# Patient Record
Sex: Female | Born: 1965 | Race: White | Hispanic: No | Smoking: Current every day smoker
Health system: Southern US, Community
[De-identification: ages and names within clinical notes are randomized; demographics above are authoritative.]

## PROBLEM LIST (undated history)

## (undated) DIAGNOSIS — I639 Cerebral infarction, unspecified: Secondary | ICD-10-CM

## (undated) DIAGNOSIS — Z923 Personal history of irradiation: Secondary | ICD-10-CM

## (undated) DIAGNOSIS — M1990 Unspecified osteoarthritis, unspecified site: Secondary | ICD-10-CM

## (undated) DIAGNOSIS — E669 Obesity, unspecified: Secondary | ICD-10-CM

## (undated) DIAGNOSIS — F250 Schizoaffective disorder, bipolar type: Secondary | ICD-10-CM

## (undated) DIAGNOSIS — F259 Schizoaffective disorder, unspecified: Secondary | ICD-10-CM

## (undated) DIAGNOSIS — F319 Bipolar disorder, unspecified: Secondary | ICD-10-CM

## (undated) DIAGNOSIS — Z8673 Personal history of transient ischemic attack (TIA), and cerebral infarction without residual deficits: Secondary | ICD-10-CM

## (undated) DIAGNOSIS — C50919 Malignant neoplasm of unspecified site of unspecified female breast: Secondary | ICD-10-CM

## (undated) DIAGNOSIS — F419 Anxiety disorder, unspecified: Secondary | ICD-10-CM

## (undated) DIAGNOSIS — G709 Myoneural disorder, unspecified: Secondary | ICD-10-CM

## (undated) DIAGNOSIS — F329 Major depressive disorder, single episode, unspecified: Secondary | ICD-10-CM

## (undated) DIAGNOSIS — I10 Essential (primary) hypertension: Secondary | ICD-10-CM

## (undated) DIAGNOSIS — Z720 Tobacco use: Secondary | ICD-10-CM

## (undated) DIAGNOSIS — E785 Hyperlipidemia, unspecified: Secondary | ICD-10-CM

## (undated) DIAGNOSIS — R569 Unspecified convulsions: Secondary | ICD-10-CM

## (undated) HISTORY — DX: Anxiety disorder, unspecified: F41.9

## (undated) HISTORY — DX: Obesity, unspecified: E66.9

## (undated) HISTORY — DX: Unspecified osteoarthritis, unspecified site: M19.90

## (undated) HISTORY — DX: Myoneural disorder, unspecified: G70.9

## (undated) HISTORY — DX: Malignant neoplasm of unspecified site of unspecified female breast: C50.919

## (undated) HISTORY — PX: CHOLECYSTECTOMY: SHX55

## (undated) HISTORY — DX: Unspecified convulsions: R56.9

## (undated) HISTORY — PX: ABDOMINAL HYSTERECTOMY: SHX81

## (undated) HISTORY — PX: POLYPECTOMY: SHX149

## (undated) HISTORY — DX: Personal history of transient ischemic attack (TIA), and cerebral infarction without residual deficits: Z86.73

## (undated) HISTORY — PX: COLONOSCOPY: SHX174

---

## 1998-07-03 ENCOUNTER — Encounter: Payer: Self-pay | Admitting: Emergency Medicine

## 1998-07-03 ENCOUNTER — Emergency Department (HOSPITAL_COMMUNITY): Admission: EM | Admit: 1998-07-03 | Discharge: 1998-07-03 | Payer: Self-pay | Admitting: Emergency Medicine

## 1998-07-04 ENCOUNTER — Ambulatory Visit (HOSPITAL_COMMUNITY): Admission: RE | Admit: 1998-07-04 | Discharge: 1998-07-04 | Payer: Self-pay | Admitting: Family Medicine

## 1998-07-04 ENCOUNTER — Encounter: Payer: Self-pay | Admitting: Family Medicine

## 2001-06-04 ENCOUNTER — Emergency Department (HOSPITAL_COMMUNITY): Admission: EM | Admit: 2001-06-04 | Discharge: 2001-06-04 | Payer: Self-pay | Admitting: Emergency Medicine

## 2001-06-04 ENCOUNTER — Encounter: Payer: Self-pay | Admitting: Emergency Medicine

## 2001-11-01 ENCOUNTER — Inpatient Hospital Stay (HOSPITAL_COMMUNITY): Admission: EM | Admit: 2001-11-01 | Discharge: 2001-11-03 | Payer: Self-pay

## 2001-11-04 ENCOUNTER — Other Ambulatory Visit (HOSPITAL_COMMUNITY): Admission: RE | Admit: 2001-11-04 | Discharge: 2001-11-12 | Payer: Self-pay | Admitting: *Deleted

## 2003-05-06 ENCOUNTER — Observation Stay (HOSPITAL_COMMUNITY): Admission: RE | Admit: 2003-05-06 | Discharge: 2003-05-07 | Payer: Self-pay | Admitting: Surgery

## 2003-05-06 ENCOUNTER — Encounter (INDEPENDENT_AMBULATORY_CARE_PROVIDER_SITE_OTHER): Payer: Self-pay | Admitting: Specialist

## 2003-08-30 ENCOUNTER — Emergency Department (HOSPITAL_COMMUNITY): Admission: EM | Admit: 2003-08-30 | Discharge: 2003-08-30 | Payer: Self-pay

## 2005-06-03 HISTORY — PX: BREAST BIOPSY: SHX20

## 2005-09-20 ENCOUNTER — Other Ambulatory Visit: Admission: RE | Admit: 2005-09-20 | Discharge: 2005-09-20 | Payer: Self-pay | Admitting: Family Medicine

## 2005-09-24 ENCOUNTER — Emergency Department (HOSPITAL_COMMUNITY): Admission: EM | Admit: 2005-09-24 | Discharge: 2005-09-24 | Payer: Self-pay

## 2005-09-27 ENCOUNTER — Encounter: Admission: RE | Admit: 2005-09-27 | Discharge: 2005-09-27 | Payer: Self-pay | Admitting: Family Medicine

## 2005-09-27 ENCOUNTER — Inpatient Hospital Stay (HOSPITAL_COMMUNITY): Admission: AD | Admit: 2005-09-27 | Discharge: 2005-09-27 | Payer: Self-pay | Admitting: Obstetrics

## 2005-09-30 ENCOUNTER — Encounter: Admission: RE | Admit: 2005-09-30 | Discharge: 2005-09-30 | Payer: Self-pay | Admitting: Obstetrics & Gynecology

## 2005-09-30 ENCOUNTER — Encounter (INDEPENDENT_AMBULATORY_CARE_PROVIDER_SITE_OTHER): Payer: Self-pay | Admitting: Specialist

## 2005-10-10 ENCOUNTER — Encounter (INDEPENDENT_AMBULATORY_CARE_PROVIDER_SITE_OTHER): Payer: Self-pay | Admitting: Specialist

## 2005-10-10 ENCOUNTER — Inpatient Hospital Stay (HOSPITAL_COMMUNITY): Admission: RE | Admit: 2005-10-10 | Discharge: 2005-10-12 | Payer: Self-pay | Admitting: Obstetrics & Gynecology

## 2005-10-24 ENCOUNTER — Emergency Department (HOSPITAL_COMMUNITY): Admission: EM | Admit: 2005-10-24 | Discharge: 2005-10-24 | Payer: Self-pay | Admitting: Emergency Medicine

## 2006-02-11 ENCOUNTER — Emergency Department (HOSPITAL_COMMUNITY): Admission: EM | Admit: 2006-02-11 | Discharge: 2006-02-11 | Payer: Self-pay | Admitting: Emergency Medicine

## 2006-10-24 ENCOUNTER — Emergency Department (HOSPITAL_COMMUNITY): Admission: EM | Admit: 2006-10-24 | Discharge: 2006-10-24 | Payer: Self-pay | Admitting: Emergency Medicine

## 2006-11-12 ENCOUNTER — Emergency Department (HOSPITAL_COMMUNITY): Admission: EM | Admit: 2006-11-12 | Discharge: 2006-11-12 | Payer: Self-pay | Admitting: Emergency Medicine

## 2006-12-04 ENCOUNTER — Encounter: Admission: RE | Admit: 2006-12-04 | Discharge: 2007-01-06 | Payer: Self-pay | Admitting: Family Medicine

## 2007-06-08 ENCOUNTER — Inpatient Hospital Stay (HOSPITAL_COMMUNITY): Admission: RE | Admit: 2007-06-08 | Discharge: 2007-06-16 | Payer: Self-pay | Admitting: *Deleted

## 2007-06-09 ENCOUNTER — Ambulatory Visit: Payer: Self-pay | Admitting: *Deleted

## 2007-08-09 ENCOUNTER — Emergency Department (HOSPITAL_COMMUNITY): Admission: EM | Admit: 2007-08-09 | Discharge: 2007-08-10 | Payer: Self-pay | Admitting: Emergency Medicine

## 2008-01-31 ENCOUNTER — Emergency Department (HOSPITAL_COMMUNITY): Admission: EM | Admit: 2008-01-31 | Discharge: 2008-01-31 | Payer: Self-pay | Admitting: Emergency Medicine

## 2009-03-17 ENCOUNTER — Inpatient Hospital Stay (HOSPITAL_COMMUNITY): Admission: EM | Admit: 2009-03-17 | Discharge: 2009-03-18 | Payer: Self-pay | Admitting: Emergency Medicine

## 2009-03-18 ENCOUNTER — Inpatient Hospital Stay (HOSPITAL_COMMUNITY): Admission: AD | Admit: 2009-03-18 | Discharge: 2009-03-24 | Payer: Self-pay | Admitting: Psychiatry

## 2009-03-18 ENCOUNTER — Ambulatory Visit: Payer: Self-pay | Admitting: Psychiatry

## 2009-04-03 ENCOUNTER — Observation Stay (HOSPITAL_COMMUNITY): Admission: EM | Admit: 2009-04-03 | Discharge: 2009-04-04 | Payer: Self-pay | Admitting: Emergency Medicine

## 2010-09-05 LAB — URINALYSIS, ROUTINE W REFLEX MICROSCOPIC
Bilirubin Urine: NEGATIVE
Glucose, UA: NEGATIVE mg/dL
Ketones, ur: NEGATIVE mg/dL
Nitrite: NEGATIVE
Protein, ur: NEGATIVE mg/dL
Specific Gravity, Urine: 1.005 (ref 1.005–1.030)
Urobilinogen, UA: 0.2 mg/dL (ref 0.0–1.0)

## 2010-09-05 LAB — URINE MICROSCOPIC-ADD ON

## 2010-09-05 LAB — BASIC METABOLIC PANEL
BUN: 7 mg/dL (ref 6–23)
Calcium: 9.3 mg/dL (ref 8.4–10.5)
Creatinine, Ser: 0.74 mg/dL (ref 0.4–1.2)
GFR calc non Af Amer: >60 mL/min (ref >60)
Glucose, Bld: 94 mg/dL (ref 70–99)
Potassium: 3.6 mEq/L (ref 3.5–5.1)

## 2010-09-05 LAB — CBC: MCV: 92.6 fL (ref 78.0–100.0)

## 2010-09-05 LAB — DIFFERENTIAL
Basophils Relative: 1 % (ref 0–1)
Eosinophils Absolute: 0.1 10*3/uL (ref 0.0–0.7)
Lymphocytes Relative: 39 % (ref 12–46)
Neutro Abs: 3.4 10*3/uL (ref 1.7–7.7)
Neutrophils Relative %: 52 % (ref 43–77)

## 2010-09-05 LAB — URINE CULTURE: Colony Count: NO GROWTH

## 2010-09-06 LAB — DIFFERENTIAL
Basophils Absolute: 0 10*3/uL (ref 0.0–0.1)
Basophils Relative: 1 % (ref 0–1)
Eosinophils Absolute: 0.1 10*3/uL (ref 0.0–0.7)
Monocytes Relative: 6 % (ref 3–12)
Neutro Abs: 3.3 10*3/uL (ref 1.7–7.7)
Neutrophils Relative %: 50 % (ref 43–77)

## 2010-09-06 LAB — HEPATIC FUNCTION PANEL
ALT: 20 U/L (ref 0–35)
AST: 17 U/L (ref 0–37)
Albumin: 3.3 g/dL — ABNORMAL LOW (ref 3.5–5.2)
Alkaline Phosphatase: 104 U/L (ref 39–117)
Total Protein: 6.4 g/dL (ref 6.0–8.3)

## 2010-09-06 LAB — COMPREHENSIVE METABOLIC PANEL
ALT: 25 U/L (ref 0–35)
AST: 24 U/L (ref 0–37)
Albumin: 3.8 g/dL (ref 3.5–5.2)
Alkaline Phosphatase: 118 U/L — ABNORMAL HIGH (ref 39–117)
BUN: 6 mg/dL (ref 6–23)
CO2: 27 mEq/L (ref 19–32)
Calcium: 9.3 mg/dL (ref 8.4–10.5)
Chloride: 100 mEq/L (ref 96–112)
Creatinine, Ser: 0.87 mg/dL (ref 0.4–1.2)
GFR calc Af Amer: >60 mL/min (ref >60)
GFR calc non Af Amer: >60 mL/min (ref >60)
Glucose, Bld: 112 mg/dL — ABNORMAL HIGH (ref 70–99)
Potassium: 3.3 mEq/L — ABNORMAL LOW (ref 3.5–5.1)
Sodium: 136 mEq/L (ref 135–145)
Total Bilirubin: 0.6 mg/dL (ref 0.3–1.2)
Total Protein: 6.9 g/dL (ref 6.0–8.3)

## 2010-09-06 LAB — CBC
HCT: 41.2 % (ref 36.0–46.0)
Hemoglobin: 13.7 g/dL (ref 12.0–15.0)
MCHC: 33.3 g/dL (ref 30.0–36.0)
MCV: 92.8 fL (ref 78.0–100.0)
Platelets: 248 10*3/uL (ref 150–400)
RBC: 4.44 MIL/uL (ref 3.87–5.11)
RDW: 14 % (ref 11.5–15.5)
WBC: 6.7 10*3/uL (ref 4.0–10.5)

## 2010-09-06 LAB — ETHANOL: Alcohol, Ethyl (B): <5 mg/dL (ref 0–10)

## 2010-09-06 LAB — RAPID URINE DRUG SCREEN, HOSP PERFORMED
Barbiturates: NOT DETECTED
Opiates: NOT DETECTED

## 2010-09-06 LAB — ACETAMINOPHEN LEVEL: Acetaminophen (Tylenol), Serum: <10 ug/mL — ABNORMAL LOW (ref 10–30)

## 2010-10-16 NOTE — H&P (Signed)
April [name], [name]                ACCOUNT NO.:  [id number]   MEDICAL RECORD NO.:  [id number]          PATIENT TYPE:  IPS   LOCATION:  0304                          FACILITY:  [facility]   PHYSICIAN:  Anselm Jungling, MD  DATE OF BIRTH:  [birth year]   DATE OF ADMISSION:  06/08/2007  DATE OF DISCHARGE:                       PSYCHIATRIC ADMISSION ASSESSMENT   HISTORY:  This is a 45 year old female voluntarily admitted on June 08, 2007.  The patient presents with a history of depression since August  she states since her husband had left her.  She states she has been  trying to hide her depression and thinking that, if she could just kill  herself, things would be better.  The patient had thoughts to shoot  herself.  She does have access to guns.  Stresses are that her husband  left her after 20 years for another woman.  She is currently evicted  from her home.  The patient is feeling very overwhelmed.  She is  currently living with her sister.  The patient feels she is a burden to  her sister.   PAST PSYCHIATRIC HISTORY:  First admission to [facility].  She has been on Prozac in the past.  She is taking various doses at  various times since August.  She reports a history of cutting.   SOCIAL HISTORY:  This is a 45 year old married female, married for 20  years.  This is her second marriage.  She has 3 children, 2 are grown  and 52 is a 86 year old.  The 45 year old lives with the husband's  parents.  The patient has been unemployed for 10 years.  She denies any  legal problems.   FAMILY HISTORY:  None.   SOCIAL HISTORY:  The patient smokes.  She denies any alcohol use or  smoking marijuana.   PRIMARY CARE PHYSICIAN:  Dr. Elias Else, phone number [phone].   PAST MEDICAL HISTORY:  No acute or chronic health issues.   PAST SURGICAL HISTORY:  Significant for:  Hysterectomy and gallbladder  removal in 2004.   MEDICATIONS:  She has been taking Prozac anywhere from 40  to 80 mg  prescribed by Dr. Elias Else.   ALLERGIES:  MORPHINE.  She reports a rash.   CHIEF COMPLAINT:  Suicidal thoughts.  The patient is presenting tearful.   REVIEW OF SYSTEMS:  She denies any fever, chills.  No chest pain or  shortness of breath.  The patient smokes.  No nausea, vomiting or  diarrhea.  No blurred vision.  No headache.  No dizziness.  No falls.  Positive for depression.  Positive for anxiety.  Positive for suicidal  thoughts.   PHYSICAL EXAMINATION:  VITAL SIGNS:  Temperature is 98.1, 77 heart rate,  14 respirations, blood pressure is 138/92, 159 pounds and she is  approximately 5 feet 6 inches tall.  GENERAL:  This is a middle-aged female in no acute physical distress.  HEENT:  Her head is atraumatic.  TM s with no redness, no injection.  Her dental hygiene is adequate.  NECK:  Negative lymphadenopathy.  CHEST:  Clear, no wheezing.  BREASTS:  Exam was deferred.  HEART:  Regular rate and rhythm.  ABDOMEN:  Soft, nontender.  EXTREMITIES:  Moves all extremities, 5+ against resistance.  No edema.  No clubbing.  SKIN:  The patient has a vesicular rash beneath her xyphoid process that  healed.  It does have some scaly appearance.  She also has some  scattered also scaly lesions on her arms and shoulders and has healed  scars to her forearms.  NEUROLOGIC:  Her neurological findings are intact and nonfocal.  Cranial  nerves II through XII are intact.  MENTAL STATUS EXAM:  This is a middle-aged female.  She is casually  dressed, good eye contact.  Her speech is clear, normal pace and tone.  The patient's mood is depressed.  Her affect is tearful and somewhat  traumatic.  Thought process are coherent.  There is no evidence of any  psychosis.  Her answers are coherent and goal directed.  Cognitive  function intact.  Her memory is good.  Judgment and insight is fair.   LABORATORY:  Glucose of 104.  CBC is within normal limits.  Urine drug  screen is not  available.   DIAGNOSES:  AXIS I:  Major depressive disorder, recurrent, severe.  AXIS II:  Deferred.  AXIS III:  No acute or chronic health issues.  AXIS IV:  Problems with occupation, housing, economic issues and other  psychosocial problems.  AXIS V:  Current is 35.   PLAN:  Contract for safety.  We will stabilize.  We will continue with  the Prozac at 40 mg.  We will add Risperdal for anxiety, ruminating and  mood stabilization.  Risk and benefits of the medication were discussed.  The patient is agreeable to beginning medications.  We will also add  p.r.n. Risperdal throughout the day.  The patient to increase coping  skills.  We will contact her primary care Jini Horiuchi in regard to her rash  for recommendations.  We will have a family session with her support  group. Medication compliance to be reinforced.  Case manager is to  assess her follow up.   ESTIMATED LENGTH OF STAY:  Four to 6 days.      Landry Corporal, N.P.      Anselm Jungling, MD  Electronically Signed    JO/MEDQ  D:  06/11/2007  T:  06/12/2007  Job:  [id number]   cc:   Anselm Jungling, MD   Jasmine Pang, M.D.

## 2010-10-16 NOTE — Consult Note (Signed)
NAME:  April [name], April [name] NO.:  [id number]   MEDICAL RECORD NO.:  [id number]          PATIENT TYPE:  EMS   LOCATION:  MAJO                         FACILITY:  [facility]   PHYSICIAN:  Bernette Redbird, M.D.   DATE OF BIRTH:  19-Dec-1965   DATE OF CONSULTATION:  11/12/2006  DATE OF DISCHARGE:                                 CONSULTATION   Dr. Lorre Nick of the ER staff asked [place] to see this 45 year old  Caucasian female because of rectal bleeding.   April [name] has a history of rectal/pelvic floor pain and recurrent rectal  bleeding, for which she underwent colonoscopic evaluation by Dr. Randa Evens  approximately five days ago, at which time she was known to have  internal hemorrhoids but no polyps.  Random mucosal biopsies were  obtained to look for evidence of microscopic colitis, the results of  which are not available to me at this time.   With that background, the patient has noticed some ongoing rectal  bleeding which has been a recurrent intermittent problem for her, as  well as the above-mentioned rectal pain.  She has been asking for pain  medicine in the ER although 2 mg of Dilaudid did not really help her.   Evaluation by the ER physician showed that the patient was minimally  tachycardiac with heart rate around 100, but without orthostatic changes  in pulse or blood pressure and her hemoglobin came back normal at 13.2  with a normal MCV and normal platelet count.  In addition, chemistry  panel was normal with particular reference to her BUN level.   Based on all of this, she was thought to be probably clinically stable  but it was requested that we see her to clear her for discharge from the  emergency room, in view of her mild tachycardia.   PHYSICAL EXAMINATION:  Supine blood pressure 104/68 with pulse of 75.  Standing blood pressure 131/84, pulse of 84.  She is anicteric and without pallor.  She is somewhat withdrawn and  tearful.  She is, however, in no acute  distress.  She asked several  times if we were going to give her pain medications.  CHEST:  The chest has a few soft expiratory wheezes consistent with her  smoking history.  HEART:  The heart is normal with a regular rhythm and rate,  approximately 87 beats per minute.  ABDOMEN:  The abdomen has normal bowel sounds and no organomegaly,  guarding, mass, or tenderness whatsoever.  Perianal exam shows a little bit of dried blood on the perianal skin, no  prolapsed hemorrhoids, no obvious fissures, fistulae or abscess.  No  skin tags.  Sphincter tone is normal to perhaps slightly increased.  The  rectal ampulla is normal but there is some soft brown stool present  which is admixed with a small amount of blood.   Rigid sigmoidoscopy:  This was performed to about 10 cm or 12 cm and  showed brown stool at the apex of the rectum with a small amount of  strands of blood in the more distal rectum.  No fresh blood, large  clots, or liquid blood were seen, nor was any active bleeding evident at  the time of this exam.  The rectal mucosa looked normal and no polyps or  masses were seen.  Careful pull out through the anal canal showed what  were actually rather mild to moderate internal hemorrhoids, nothing  particularly succulent or enlarged, nothing actively bleeding.  I did  not see an anal fissure.   IMPRESSION:  I think the overall picture is very compatible with a  hemorrhoidal bleed.  It does not appear that the blood is coming from  above based on the brown stool in the proximal rectum, the stable vital  signs, the normal hemoglobin level, the normal BUN, and the negative  recent colonoscopy.  The reason the hemorrhoids do not currently look  more pronounced is probably that they have decompressed due to the  current bleeding.   PLAN:  1. The patient was offered a medication such as NuLev for her pelvic      floor pain but she indicates that belladonna/phenobarbital has not      helped  and she does not really seem particularly interested in      trying that medication.  Note that sitz baths have also not been      helpful.  She might benefit from pelvic floor EMGs and biofeedback      relaxation training.  2. Regarding bleeding which is the main thing that prompted her to      come to the emergency room, I offered her reassurance and explained      that she can expect to see such bleeding for several days at a time      with each bowel movement, as is often the case for hemorrhoidal      bleeding.  I explained that there was not evidence of any      substantial hemorrhage from above and that her white count was      normal as were her vital signs.  To address this problem, I have      called Dr. Randa Evens' medical assistant who will contact [facility] to have an appointment made for this      patient in the near future and the patient knows to contact us if      she has not heard from their office in the next two days.           ______________________________  Bernette Redbird, M.D.     RB/MEDQ  D:  11/12/2006  T:  11/13/2006  Job:  [id number]   cc:   April [name]., M.D.  [facility]

## 2010-10-16 NOTE — Discharge Summary (Signed)
[name], [name]                ACCOUNT NO.:  [id number]   MEDICAL RECORD NO.:  [id number]          PATIENT TYPE:  IPS   LOCATION:  0304                          [facility]ACILITY[facility]  [facility]   PHYSICIAN:  Anselm Jungling, MD  DATE OF BIRTH:  [birth year]   DATE OF ADMISSION:  06/08/2007  DATE OF DISCHARGE:  06/16/2007                               DISCHARGE SUMMARY   IDENTIFYING DATA/REASON FOR ADMISSION:  This is an inpatient psychiatric  admission for April [name], a 46 year old female who presented with a history  of depression of 4 months duration, stemming from the time that her  husband had left her.  She had been having increasing thoughts of  suicide.  She thought of shooting herself, and she did have access to  guns.  She had also recently been evicted from her home and was feeling  generally overwhelmed.  She has been living with her sister.  This was  her first [facility] admission.  Please refer to the admission note for further  details pertaining to the symptoms, circumstances and history that led  to her hospitalization.  She was given initial Axis I diagnosis of major  depressive disorder, recurrent, severe.   MEDICAL AND LABORATORY:  The patient was medically and physically  assessed by the psychiatric nurse practitioner.  She was in good health  without active or chronic medical problems.  Her primary care physician  is Dr. Nicholos Johns.  There were no significant medical issues.  She was  referred back to Dr. Nicholos Johns for followup appointment pertaining to a  longstanding rash that she had had.  The nurse practitioner discussed  the rash on the phone with Dr. Nicholos Johns, and he stated that he wanted to  evaluate it in his office after discharge.   HOSPITAL COURSE:  The patient was admitted to the adult inpatient  psychiatric service.  She presented as a well-nourished, well-developed  adult female who was quite tired-appearing and very depressed.  There  were no signs or symptoms of psychosis or thought  disorder.  She denied  any acute suicidal ideation and verbalized a strong desire for help.   She had reported a good response to Prozac in the past and was restarted  on Prozac at 20 mg daily, which was later increased to 40 mg daily.  Trazodone was used at bedtime.   The patient described a good deal of anxiety symptoms accompanied by  agitation and difficulty sleeping.  Because of this, she was started on  trials of low-dose Risperdal at bedtime.  This was well tolerated, and  Risperdal was increased in an incremental fashion up to an ultimate  level of 3 mg q.h.s., with which she was getting very good results.   On the ninth hospital day, the patient appeared appropriate for  discharge.  She had been absent suicidal ideation for several days.  She  appeared to have benefited as much as possible from the program and was  tolerating her medication.  She agreed to the following aftercare plan.   AFTERCARE:  The patient was to follow up with Dr. Lolly Mustache  in our  outpatient clinic in [place] on June 26, 2007.  She was also to  follow up with Dr. Nicholos Johns on January 12, at 12:00 noon for general  medical followup.   DISCHARGE MEDICATIONS:  Prozac 40 mg daily, Risperdal 3 mg q.h.s.,  trazodone 100 mg q.h.s.   DISCHARGE DIAGNOSES:  AXIS I:  Major depressive disorder, recurrent,  without psychotic features, rule out bipolar II, depressed.  AXIS II:  Deferred.  AXIS III:  Rash of unknown etiology.  AXIS IV:  Stressors severe.  AXIS V:  GAF on discharge 60.      Anselm Jungling, MD  Electronically Signed     SPB/MEDQ  D:  06/19/2007  T:  06/19/2007  Job:  [id number]

## 2010-10-19 NOTE — Op Note (Signed)
April [name], April [name]                          ACCOUNT NO.:  [id number]   MEDICAL RECORD NO.:  [id number]                   PATIENT TYPE:  AMB   LOCATION:  DAY                                  FACILITY:  [facility]   PHYSICIAN:  Currie Paris, M.D.           DATE OF BIRTH:  05/01/1966   DATE OF PROCEDURE:  05/06/2003  DATE OF DISCHARGE:                                 OPERATIVE REPORT   CCS#:  [id number]   PREOPERATIVE DIAGNOSIS:  Chronic cholecystitis with biliary dyskinesia.   POSTOPERATIVE DIAGNOSIS:  Chronic cholecystitis with biliary dyskinesia.   OPERATION:  Laparoscopic cholecystectomy with operative cholangiogram.   SURGEON:  Currie Paris, M.D.   ASSISTANT:  Sheppard Plumber. Earlene Plater, M.D.   ANESTHESIA:  General endotracheal.   HISTORY:  This patient is a 45 year old whose had some history of elevation  of liver functions and biliary type symptoms and her workup showed no stones  but marked abnormal emptying fraction.   DESCRIPTION OF PROCEDURE:  The patient was seen in the holding area and had  no further questions. She was taken to the operating room and after  satisfactory general endotracheal anesthesia had been obtained, the abdomen  was prepped and draped. The 0.25% plain Marcaine was used for each incision.  The umbilical incision was made first, the fascia opened and the peritoneal  cavity entered under direct vision. A pursestring was placed, Hasson  introduced, and the abdomen insufflated to 15. A camera was introduced and  the liver appeared to be grossly normal. There were a few adhesions over the  gallbladder. A 10/11 trocar was placed in the epigastrium and two 5 mm  laterally and the patient placed in reverse Trendelenburg. The gallbladder  was retracted over the liver and the peritoneum over the cystic duct opened  and a nice window made behind the cystic duct and the triangle of Calot. I  opened the peritoneum on both sides of the gallbladder. The cystic  artery  was somewhat adherent to the posterior aspect of the cystic duct, was  dissected off and a clip was placed on the artery and one on the cystic duct  at its junction with the gallbladder.   The cystic duct was opened and a Cook catheter introduced percutaneously and  placed in the cystic duct and held with the clip. Operative cholangiography  showed a normal cystic duct, normal common duct, good filling in the  duodenum and filling of the hepatic radicles with no evidence of stones or  other problems.   The cystic duct catheter was removed and three clips placed in the stay side  of the cystic duct and it was divided. Additional clips were placed on the  cystic duct and it was divided leaving two clips on the stay side. The  gallbladder was then removed from below to above with coagulation current of  the current. Just prior to disconnecting and just after  disconnecting, we  irrigated and made sure the bed was completely dry. The gallbladder was  brought out the umbilical port and the abdomen reinsufflated. A final  irrigation check for hemostasis was made and again everything appeared dry.  The lateral ports were removed and there was no bleeding. The umbilical port  was closed with the  pursestring. The abdomen was deflated through the epigastric port. The skin  was closed with 4-0 Monocryl subcuticular and Dermabond.   The patient tolerated the procedure well. There were no operative  complications and all counts were correct.                                               Currie Paris, M.D.    CJS/MEDQ  D:  05/06/2003  T:  05/06/2003  Job:  [id number]   cc:   Griffith Citron, M.D.  [facility] [place]  [place] [address]  Fax: [phone]   [facility] [address]

## 2010-10-19 NOTE — H&P (Signed)
NAMELYNDSI, ALTIC                ACCOUNT NO.:  [id number]   MEDICAL RECORD NO.:  [id number]          PATIENT TYPE:  INP   LOCATION:  NA                            FACILITY:  [facility]   PHYSICIAN:  Roseanna Rainbow, M.D.DATE OF BIRTH:  March 20, 1966   DATE OF ADMISSION:  DATE OF DISCHARGE:                                HISTORY & PHYSICAL   CHIEF COMPLAINT:  The patient is a 45 year old Caucasian female with a newly  diagnosed pelvic mass who presents for total abdominal hysterectomy,  possible bilateral salpingo-oophorectomy.   HISTORY OF PRESENT ILLNESS:  The patient had presented to the [facility]  Emergency Room with abdominal pain, nausea, vomiting, and diarrhea several  weeks prior to presentation.  Workup at that point was remarkable for  possible right-sided ovarian cyst.  Further workup included subsequent MRI  of the pelvis that demonstrated a 9 cm soft tissue mass in the posterior cul-  de-sac that was contiguous with the left ovary and the uterus.  There was  also an incidental small fibroid seen in the uterus.  The CA-125 on April 25  was 20.7.   ALLERGIES:  MORPHINE.   MEDICATIONS:  Prozac, Naprosyn.   PAST MEDICAL HISTORY:  Depression.   PAST SURGICAL HISTORY:  1.  Cholecystectomy.  2.  Bilateral tubal ligation.   SOCIAL HISTORY:  Married, lives with her spouse.  She currently smokes,  drinks a minimal amount of alcohol.  Admits to using marijuana.   FAMILY HISTORY:  COPD, MS.   PAST OB-GYN HISTORY:  1.  Recent mammogram normal.  2.  History of cervical dysplasia and cryocautery of the cervix.  3.  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   REVIEW OF SYSTEMS:  GI: See above.  GU: She has a history of dysmenorrhea  and heavy menses.   PHYSICAL EXAMINATION:  VITAL SIGNS: Stable, afebrile.  GENERAL:  Well-developed, well-nourished, minimal distress.  ABDOMEN: Soft, nontender, without masses.  Bowel  sounds active.  LUNGS: Clear to auscultation bilaterally.  HEART: Regular rate and rhythm.  PELVIC:  Normal EG/BUS.  On speculum exam, the vagina is clean.  On bimanual  exam, the uterus is anteverted, normal size, nontender.  Adnexa: No masses  or local guarding. On rectovaginal exam, there is a posterior cul-de-sac  mass.   ASSESSMENT:  Pelvic mass.  Differential diagnoses: Pedunculated myoma,  benign left adnexal tumor.   PLAN:  The planned procedure is exploratory laparotomy, total abdominal  hysterectomy, possible bilateral salpingo-oophorectomy.  The risks,  benefits, and alternative forms of management were reviewed with the  patient, and informed consent has been obtained.      Roseanna Rainbow, M.D.  Electronically Signed     LAJ/MEDQ  D:  10/09/2005  T:  10/09/2005  Job:  [id number]

## 2010-10-19 NOTE — Discharge Summary (Signed)
April [name], April [name]                ACCOUNT NO.:  [id number]   MEDICAL RECORD NO.:  [id number]          PATIENT TYPE:  INP   LOCATION:  9315                          [facility]ACILITY:  [facility]   PHYSICIAN:  Roseanna Rainbow, M.D.DATE OF BIRTH:  1965/12/14   DATE OF ADMISSION:  10/10/2005  DATE OF DISCHARGE:  10/12/2005                                 DISCHARGE SUMMARY   CHIEF COMPLAINT:  The patient is a 45 year old Caucasian female with a newly  diagnosed pelvic mass who presents for a total abdominal hysterectomy,  possible bilateral salpingo-oophorectomy.  Please see the dictated History  and Physical for further details.   HOSPITAL COURSE:  The patient was admitted and underwent a total abdominal  hysterectomy and bilateral salpingo-oophorectomy.  Please see the dictated  operative summary for further details.   Her postoperative course was uneventful.  On postoperative day #1, her  hemoglobin was 11.3.  She was discharged to home on postoperative day #2,  tolerating a regular diet.   DISCHARGE DIAGNOSES:  1.  Uterine fibroids.  2.  Adenomyosis.  3.  Ovarian fibrosarcoma.   PROCEDURES:  Total abdominal hysterectomy and bilateral salpingo-  oophorectomy.   CONDITION:  Good.   DIET:  Regular.   ACTIVITY:  Progressive activity, pelvic rest.   MEDICATIONS:  1.  Resume home medications.  2.  OxyContin 20 mg 1 tablet p.o. b.i.d.  3.  Dilaudid 4 mg tablets 1 tablet p.o. 4 times a day as needed.  4.  Ibuprofen 600 mg 1 tablet p.o. 4 times a day as needed.  5.  Climara patch, apply weekly.  6.  Over-the-counter stool softener b.i.d.   DISPOSITION:  The patient was to follow up in the office in 2 weeks.     Roseanna Rainbow, M.D.  Electronically Signed    LAJ/MEDQ  D:  11/09/2005  T:  11/10/2005  Job:  [id number]
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
## 2010-10-19 NOTE — Op Note (Signed)
NAMEEARLENE, April [name] NO.:  [id number]   MEDICAL RECORD NO.:  [id number]          PATIENT TYPE:  INP   LOCATION:  9315                          FACILITY:  [facility]   PHYSICIAN:  Roseanna Rainbow, M.D.DATE OF BIRTH:  1965-08-23   DATE OF PROCEDURE:  10/10/2005  DATE OF DISCHARGE:                                 OPERATIVE REPORT   PREOPERATIVE DIAGNOSIS:  Pelvic mass.   POSTOPERATIVE DIAGNOSIS:  Left-sided ovarian fibro-thecoma, small fibroid  uterus   PROCEDURE:  Total abdominal hysterectomy with bilateral salpingo-  oophorectomy.   SURGEONS:  1.  Dr. Tamela Oddi  2.  Dr. Clearance Coots   ANESTHESIA:  General endotracheal.   COMPLICATIONS:  None.   IV FLUIDS AND URINE OUTPUT:  As per anesthesiology.   ESTIMATED BLOOD LOSS:  350 mL.   PROCEDURE:  The risks, benefits, indications, and alternatives of the  procedure were reviewed with the patient, and informed consent had been  obtained.  She was taken to the operating room with an IV running.  The  patient was placed in the dorsal supine position, given general anesthesia,  and prepped and draped in the usual sterile fashion.  A Pfannenstiel skin  incision was then made approximately 2 cm above the symphysis pubis and  extended to the fascia.  The fascia was then incised bilaterally with curved  Mayo scissors.  The muscles of the anterior abdominal wall were separated in  the midline.  The parietal peritoneum was tented up and entered bluntly.  The peritoneal incision was extended.  The pelvis was examined, and the left  ovary was enlarged approximately 8 cm in diameter with a firm, solid mass.  There were some excrescences noted.  An O'Connor-O'Sullivan retractor was  placed into the incision.  The bowel was packed away with moistened  laparotomy sponges.  At this point, there were filmy adhesions involving the  sigmoid colon to the infundibulopelvic ligament on the left.  These were  lysed with the Bovie.   The utero-ovarian ligament was skeletonized, doubly  clamped with parametrial clamps, and transected.  Both free ligatures and  suture ligatures were placed.  The utero-ovarian ligament and fallopian tube  were then clamped in the cornua.  And the left adnexa was excised.  This was  sent for frozen section.  Please note that washings were sent prior to this.  Two long Kelly clamps were then placed on the cornu and used for retraction.  The round ligament on both sides were divided with Bovie.  The anterior leaf  of the broad ligament was incised along the bladder reflection to the  midline from both sides.  The bladder was dissected off the lower uterine  segment and cervix.  The infundibulopelvic ligament on the right side was  doubly clamped, transected, and both free ligature and suture ligature was  placed.  Hemostasis was visualized.  The uterine arteries were skeletonized  bilaterally, clamped with parametrial clamps, transected, and suture-ligated  with 0 Vicryl.  Again, hemostasis was assured.  The uterosacral ligaments  were clamped on both sides, transected, and suture ligated in a similar  fashion.  The cervix was amputated with scissors.  The vaginal cuff angles  were suture ligated with sutures of 0 Vicryl.  The remainder of the vaginal  cuff was closed with a series of interrupted sutures of 0 Vicryl.  Hemostasis was assured.  The pelvis was copiously irrigated with warm normal  saline.  All laparotomy sponges and instruments were removed from the  abdomen.  The parietal peritoneum was closed with a running 2-0 Monocryl.  The fascia was closed with a running 0 Vicryl.  The skin was closed with  staples.  Sponge, lap, needle, and instrument counts were correct x2.  The  patient was taken to the PACU awake and in stable condition.      Roseanna Rainbow, M.D.  Electronically Signed     LAJ/MEDQ  D:  10/10/2005  T:  10/11/2005  Job:  [id number]

## 2011-02-21 LAB — CBC
HCT: 39.3
Hemoglobin: 13.6
MCV: 89.6
WBC: 8.5

## 2011-02-21 LAB — COMPREHENSIVE METABOLIC PANEL
Alkaline Phosphatase: 96
BUN: 10
CO2: 28
Chloride: 102
GFR calc non Af Amer: 58 — ABNORMAL LOW
Glucose, Bld: 104 — ABNORMAL HIGH
Potassium: 3.6
Total Bilirubin: 0.5

## 2011-02-21 LAB — URINALYSIS, ROUTINE W REFLEX MICROSCOPIC
Glucose, UA: NEGATIVE
Hgb urine dipstick: NEGATIVE
Ketones, ur: NEGATIVE
pH: 5.5

## 2011-02-21 LAB — DRUGS OF ABUSE SCREEN W/O ALC, ROUTINE URINE
Amphetamine Screen, Ur: NEGATIVE
Barbiturate Quant, Ur: NEGATIVE
Creatinine,U: 125.9
Phencyclidine (PCP): NEGATIVE

## 2011-02-21 LAB — BENZODIAZEPINE, QUANTITATIVE, URINE: Alprazolam (GC/LC/MS), ur confirm: NEGATIVE

## 2011-02-21 LAB — THC (MARIJUANA), URINE, CONFIRMATION: Marijuana, Ur-Confirmation: 46 ng/mL

## 2011-02-25 LAB — COMPREHENSIVE METABOLIC PANEL
AST: 21
CO2: 27
Chloride: 101
Creatinine, Ser: 0.82
GFR calc Af Amer: >60
GFR calc non Af Amer: >60
Total Bilirubin: 0.6

## 2011-02-25 LAB — POCT CARDIAC MARKERS
Operator id: 3206
Troponin i, poc: <0.05
Troponin i, poc: <0.05

## 2011-02-25 LAB — DIFFERENTIAL
Basophils Absolute: 0.1
Eosinophils Absolute: 0.1
Eosinophils Relative: 2
Lymphocytes Relative: 41

## 2011-02-25 LAB — CBC
HCT: 38.4
Hemoglobin: 13.4
MCV: 89.7
RBC: 4.28
WBC: 8

## 2011-02-25 LAB — PREGNANCY, URINE: Preg Test, Ur: NEGATIVE

## 2011-02-25 LAB — RAPID URINE DRUG SCREEN, HOSP PERFORMED
Opiates: POSITIVE — AB
Tetrahydrocannabinol: POSITIVE — AB

## 2011-03-21 LAB — COMPREHENSIVE METABOLIC PANEL
AST: 20
Alkaline Phosphatase: 115
BUN: 11
CO2: 24
Chloride: 105
Creatinine, Ser: 0.65
GFR calc non Af Amer: >60
Total Bilirubin: 0.4

## 2011-03-21 LAB — DIFFERENTIAL
Eosinophils Relative: 2
Lymphocytes Relative: 41
Lymphs Abs: 4 — ABNORMAL HIGH
Monocytes Absolute: 0.7

## 2011-03-21 LAB — TYPE AND SCREEN
ABO/RH(D): O NEG
Antibody Screen: NEGATIVE

## 2011-03-21 LAB — CBC
HCT: 38.8
Hemoglobin: 13.2
RBC: 4.27
WBC: 9.8

## 2011-03-21 LAB — PROTIME-INR: INR: 0.9

## 2011-03-21 LAB — APTT: aPTT: 29

## 2011-03-21 LAB — ABO/RH: ABO/RH(D): O NEG

## 2012-01-11 ENCOUNTER — Encounter (HOSPITAL_COMMUNITY): Payer: Self-pay | Admitting: Emergency Medicine

## 2012-01-11 ENCOUNTER — Emergency Department (HOSPITAL_COMMUNITY)
Admission: EM | Admit: 2012-01-11 | Discharge: 2012-01-11 | Disposition: A | Discharge status: Home / Self Care | Payer: Medicaid Other | Attending: Emergency Medicine | Admitting: Emergency Medicine

## 2012-01-11 DIAGNOSIS — F17200 Nicotine dependence, unspecified, uncomplicated: Secondary | ICD-10-CM | POA: Insufficient documentation

## 2012-01-11 DIAGNOSIS — I10 Essential (primary) hypertension: Secondary | ICD-10-CM | POA: Insufficient documentation

## 2012-01-11 DIAGNOSIS — F259 Schizoaffective disorder, unspecified: Secondary | ICD-10-CM | POA: Insufficient documentation

## 2012-01-11 DIAGNOSIS — R55 Syncope and collapse: Secondary | ICD-10-CM | POA: Insufficient documentation

## 2012-01-11 DIAGNOSIS — F319 Bipolar disorder, unspecified: Secondary | ICD-10-CM | POA: Insufficient documentation

## 2012-01-11 HISTORY — DX: Bipolar disorder, unspecified: F31.9

## 2012-01-11 HISTORY — DX: Essential (primary) hypertension: I10

## 2012-01-11 HISTORY — DX: Major depressive disorder, single episode, unspecified: F32.9

## 2012-01-11 HISTORY — DX: Schizoaffective disorder, unspecified: F25.9

## 2012-01-11 HISTORY — DX: Schizoaffective disorder, bipolar type: F25.0

## 2012-01-11 LAB — COMPREHENSIVE METABOLIC PANEL
AST: 22 U/L (ref 0–37)
Albumin: 3.8 g/dL (ref 3.5–5.2)
Chloride: 99 mEq/L (ref 96–112)
Creatinine, Ser: 0.94 mg/dL (ref 0.50–1.10)
Potassium: 3.9 mEq/L (ref 3.5–5.1)
Total Bilirubin: 0.3 mg/dL (ref 0.3–1.2)

## 2012-01-11 LAB — CBC WITH DIFFERENTIAL/PLATELET
Basophils Absolute: 0.1 10*3/uL (ref 0.0–0.1)
Basophils Relative: 1 % (ref 0–1)
MCHC: 33.4 g/dL (ref 30.0–36.0)
Monocytes Absolute: 0.6 10*3/uL (ref 0.1–1.0)
Neutro Abs: 8.2 10*3/uL — ABNORMAL HIGH (ref 1.7–7.7)
Neutrophils Relative %: 68 % (ref 43–77)
RDW: 13.5 % (ref 11.5–15.5)

## 2012-01-11 NOTE — ED Notes (Signed)
Pt st's she had a passing out spell. St's her friends told her she had a seizure.  Pt not incontinent no trauma to tongue. Pt alert and oriented x's 3, skin warm and dry, color appropriate.  Neuro exam neg.

## 2012-01-11 NOTE — ED Notes (Signed)
Pt had vomiting episode, trembling, near syncope burning in back of neck. EMS said 90's systolic initially.  States that she has hx of this. Been worked up for it; possible TIA dx? Pt has taken night sedative meds.

## 2012-01-11 NOTE — ED Provider Notes (Signed)
History     CSN: [id number]  Arrival date & time 01/11/12  0132   First MD Initiated Contact with Patient 01/11/12 [phone]      Chief Complaint  Patient presents with  . Near Syncope    (Consider location/radiation/quality/duration/timing/severity/associated sxs/prior treatment) HPI Comments: Patient presents with questionable seizure versus syncope episode. Patient has episodes of "spells" that she has been recently seen by a neurologist at [facility]. Patient's mother witnessed the episode tonight. She states that the patient became minimally responsive and slumped back in a chair. She had a mild shaking of her right arm. She vomited once. Mother states that it looked like she was having a seizure. Patient's was confused afterwards. She did not bite her tongue and was not incontinent -- however she has in the past. Patient brought paperwork stating that her neurologist wanted to plan an MRI and EEG. Possible etiologies include anxiety, seizure, pseudoseizure. Patient is on several different psychiatric medications and she states that she has been taking these as prescribed. She otherwise denies fever, blurry vision, weakness in her extremities, trouble walking or talking. She has some paresthesias in the back of her neck. Her episodes occur 1-2 times a month. Last episode was 2 weeks ago. Onset acute. Course resolved. Nothing makes her symptoms better or worse.   The history is provided by the patient and a parent.    Past Medical History  Diagnosis Date  . Hypertension   . Bipolar 1 disorder   . Schizo affective schizophrenia   . Depression, major     Past Surgical History  Procedure Date  . Cholecystectomy   . Abdominal hysterectomy     No family history on file.  History  Substance Use Topics  . Smoking status: Current Everyday Smoker  . Smokeless tobacco: Not on file  . Alcohol Use: Yes     ocassional    OB History    Grav Para Term Preterm Abortions TAB SAB Ect Mult  Living                  Review of Systems  Constitutional: Negative for fever and activity change.  HENT: Negative for sore throat and rhinorrhea.   Eyes: Negative for redness.  Respiratory: Negative for cough.   Cardiovascular: Negative for chest pain.  Gastrointestinal: Negative for nausea, vomiting, abdominal pain and diarrhea.       No incontinence  Genitourinary: Negative for dysuria and enuresis.  Musculoskeletal: Negative for myalgias.  Skin: Negative for rash.  Neurological: Positive for tremors (during episode), seizures (seizure-like activity) and numbness (paresthesias). Negative for dizziness, speech difficulty, weakness, light-headedness and headaches.  Psychiatric/Behavioral: Positive for confusion.    Allergies  Morphine and related  Home Medications   Current Outpatient Rx  Name Route Sig Dispense Refill  . CITALOPRAM HYDROBROMIDE 20 MG PO TABS Oral Take 30 mg by mouth daily.    Marland Kitchen CLONAZEPAM 1 MG PO TABS Oral Take 1.5 mg by mouth daily. Scheduled dose    . CLONIDINE HCL 0.1 MG PO TABS Oral Take 0.1 mg by mouth 2 (two) times daily.    Marland Kitchen HYDROXYZINE HCL 50 MG PO TABS Oral Take 50 mg by mouth 3 (three) times daily. Scheduled doses    . LAMOTRIGINE 200 MG PO TABS Oral Take 200 mg by mouth 2 (two) times daily.    Marland Kitchen LITHIUM CARBONATE 300 MG PO CAPS Oral Take 300 mg by mouth 2 (two) times daily with a meal.    .  OLANZAPINE 15 MG PO TABS Oral Take 15 mg by mouth at bedtime.    . THIOTHIXENE 2 MG PO CAPS Oral Take 2 mg by mouth 2 (two) times daily.      BP 134/81  Pulse 85  Temp 98.1 F (36.7 C) (Oral)  Resp 20  SpO2 98%  Physical Exam  Nursing note and vitals reviewed. Constitutional: She is oriented to person, place, and time. She appears well-developed and well-nourished.  HENT:  Head: Normocephalic and atraumatic.  Right Ear: Hearing, tympanic membrane and ear canal normal.  Left Ear: Hearing, tympanic membrane and ear canal normal.  Mouth/Throat: Uvula  is midline and oropharynx is clear and moist. Mucous membranes are not dry.  Eyes: Conjunctivae are normal. Pupils are equal, round, and reactive to light. Right eye exhibits no discharge. Left eye exhibits no discharge.  Neck: Normal range of motion. Neck supple.  Cardiovascular: Normal rate, regular rhythm and normal heart sounds.   No murmur heard. Pulmonary/Chest: Effort normal and breath sounds normal. No respiratory distress. She has no wheezes. She has no rales.  Abdominal: Soft. There is no tenderness.  Musculoskeletal: She exhibits no edema and no tenderness.  Neurological: She is alert and oriented to person, place, and time. She has normal strength. No cranial nerve deficit or sensory deficit. She displays a negative Romberg sign. She displays no seizure activity. Coordination and gait normal. GCS eye subscore is 4. GCS verbal subscore is 5. GCS motor subscore is 6.  Reflex Scores:      Patellar reflexes are 2+ on the right side and 2+ on the left side. Skin: Skin is warm and dry.  Psychiatric: She has a normal mood and affect.    ED Course  Procedures (including critical care time)  Labs Reviewed  CBC WITH DIFFERENTIAL - Abnormal; Notable for the following:    WBC 12.2 (*)     Neutro Abs 8.2 (*)     All other components within normal limits  COMPREHENSIVE METABOLIC PANEL - Abnormal; Notable for the following:    Glucose, Bld 108 (*)     Alkaline Phosphatase 142 (*)     GFR calc non Af Amer 72 (*)     GFR calc Af Amer 83 (*)     All other components within normal limits  LITHIUM LEVEL - Abnormal; Notable for the following:    Lithium Lvl <0.25 (*)     All other components within normal limits  LAB REPORT - SCANNED   No results found.   1. Syncope     2:50 AM Patient seen and examined. Work-up initiated.   Vital signs reviewed and are as follows: Filed Vitals:   01/11/12 0143  BP: 134/81  Pulse: 85  Temp: 98.1 F (36.7 C)  Resp: 20   Patient discussed with  Dr. Rubin Payor.   Patient and mother informed of results. Patient is requesting to go home. I urged return with worsening symptoms, follow-up with neurologist ASAP to continue work-up on etiology.   Patient and mother are comfortable with this plan.     MDM  Seizure/syncope with hypoxic jerk/pseudoseizure/anxiety? Unclear etiology. This is one of several episodes and is entirely unchanged from previous episodes. Patient has no sequelae in ED and has not had any repeat episodes. Lithium level is low, non-toxic. Patient to follow-up with neuro and is comfortable with this. Do not think that CT head would likely be diagnostic in this case. Patient appears well and stable at discharge.  Renne Crigler, [place] 01/13/12 1958

## 2012-01-14 NOTE — ED Provider Notes (Signed)
Medical screening examination/treatment/procedure(s) were performed by non-physician practitioner and as supervising physician I was immediately available for consultation/collaboration.  Jasean Ambrosia R. Sunshyne Horvath, MD 01/14/12 0731 

## 2012-05-04 ENCOUNTER — Encounter (HOSPITAL_COMMUNITY): Payer: Self-pay | Admitting: Emergency Medicine

## 2012-05-04 ENCOUNTER — Inpatient Hospital Stay (HOSPITAL_COMMUNITY)
Admission: EM | Admit: 2012-05-04 | Discharge: 2012-05-14 | DRG: 066 | Disposition: A | Discharge status: Home Health | Payer: Medicaid Other | Attending: Internal Medicine | Admitting: Internal Medicine

## 2012-05-04 ENCOUNTER — Emergency Department (HOSPITAL_COMMUNITY): Payer: Medicaid Other

## 2012-05-04 ENCOUNTER — Encounter (HOSPITAL_COMMUNITY): Payer: Self-pay | Admitting: Nurse Practitioner

## 2012-05-04 ENCOUNTER — Emergency Department (HOSPITAL_COMMUNITY)
Admission: EM | Admit: 2012-05-04 | Discharge: 2012-05-04 | Disposition: A | Discharge status: Home / Self Care | Payer: Medicaid Other | Source: Home / Self Care | Attending: Emergency Medicine | Admitting: Emergency Medicine

## 2012-05-04 ENCOUNTER — Observation Stay (HOSPITAL_COMMUNITY): Payer: Medicaid Other

## 2012-05-04 DIAGNOSIS — R51 Headache: Secondary | ICD-10-CM

## 2012-05-04 DIAGNOSIS — I6529 Occlusion and stenosis of unspecified carotid artery: Secondary | ICD-10-CM | POA: Diagnosis present

## 2012-05-04 DIAGNOSIS — I658 Occlusion and stenosis of other precerebral arteries: Secondary | ICD-10-CM | POA: Diagnosis present

## 2012-05-04 DIAGNOSIS — R29898 Other symptoms and signs involving the musculoskeletal system: Secondary | ICD-10-CM

## 2012-05-04 DIAGNOSIS — G43909 Migraine, unspecified, not intractable, without status migrainosus: Secondary | ICD-10-CM | POA: Diagnosis present

## 2012-05-04 DIAGNOSIS — F259 Schizoaffective disorder, unspecified: Secondary | ICD-10-CM | POA: Diagnosis present

## 2012-05-04 DIAGNOSIS — F17200 Nicotine dependence, unspecified, uncomplicated: Secondary | ICD-10-CM | POA: Diagnosis present

## 2012-05-04 DIAGNOSIS — Z7982 Long term (current) use of aspirin: Secondary | ICD-10-CM

## 2012-05-04 DIAGNOSIS — R443 Hallucinations, unspecified: Secondary | ICD-10-CM

## 2012-05-04 DIAGNOSIS — F250 Schizoaffective disorder, bipolar type: Secondary | ICD-10-CM

## 2012-05-04 DIAGNOSIS — I10 Essential (primary) hypertension: Secondary | ICD-10-CM

## 2012-05-04 DIAGNOSIS — Z79899 Other long term (current) drug therapy: Secondary | ICD-10-CM

## 2012-05-04 DIAGNOSIS — Z720 Tobacco use: Secondary | ICD-10-CM

## 2012-05-04 DIAGNOSIS — Z66 Do not resuscitate: Secondary | ICD-10-CM | POA: Diagnosis present

## 2012-05-04 DIAGNOSIS — F329 Major depressive disorder, single episode, unspecified: Secondary | ICD-10-CM

## 2012-05-04 DIAGNOSIS — I6350 Cerebral infarction due to unspecified occlusion or stenosis of unspecified cerebral artery: Principal | ICD-10-CM | POA: Diagnosis present

## 2012-05-04 DIAGNOSIS — I639 Cerebral infarction, unspecified: Secondary | ICD-10-CM

## 2012-05-04 DIAGNOSIS — F32A Depression, unspecified: Secondary | ICD-10-CM

## 2012-05-04 DIAGNOSIS — R519 Headache, unspecified: Secondary | ICD-10-CM | POA: Diagnosis present

## 2012-05-04 DIAGNOSIS — E785 Hyperlipidemia, unspecified: Secondary | ICD-10-CM

## 2012-05-04 DIAGNOSIS — E875 Hyperkalemia: Secondary | ICD-10-CM

## 2012-05-04 HISTORY — DX: Major depressive disorder, single episode, unspecified: F32.9

## 2012-05-04 HISTORY — DX: Hyperlipidemia, unspecified: E78.5

## 2012-05-04 HISTORY — DX: Tobacco use: Z72.0

## 2012-05-04 LAB — CBC
MCV: 89.5 fL (ref 78.0–100.0)
MCV: 89.1 fL (ref 78.0–100.0)
Platelets: 297 10*3/uL (ref 150–400)
Platelets: 311 10*3/uL (ref 150–400)
RBC: 4.4 MIL/uL (ref 3.87–5.11)
RBC: 4.41 MIL/uL (ref 3.87–5.11)
RDW: 13.6 % (ref 11.5–15.5)
WBC: 8.1 10*3/uL (ref 4.0–10.5)
WBC: 9 10*3/uL (ref 4.0–10.5)

## 2012-05-04 LAB — PROTIME-INR: Prothrombin Time: 12.1 seconds (ref 11.6–15.2)

## 2012-05-04 LAB — CREATININE, SERUM
Creatinine, Ser: 0.79 mg/dL (ref 0.50–1.10)
GFR calc Af Amer: >90 mL/min (ref >90)

## 2012-05-04 LAB — RAPID URINE DRUG SCREEN, HOSP PERFORMED
Amphetamines: NOT DETECTED
Benzodiazepines: NOT DETECTED
Cocaine: NOT DETECTED
Opiates: NOT DETECTED
Tetrahydrocannabinol: POSITIVE — AB

## 2012-05-04 LAB — POCT I-STAT, CHEM 8
BUN: 5 mg/dL — ABNORMAL LOW (ref 6–23)
Chloride: 104 mEq/L (ref 96–112)
Sodium: 139 mEq/L (ref 135–145)
TCO2: 24 mmol/L (ref 0–100)

## 2012-05-04 LAB — ANTITHROMBIN III: AntiThromb III Func: 130 % — ABNORMAL HIGH (ref 75–120)

## 2012-05-04 LAB — DIFFERENTIAL
Lymphocytes Relative: 42 % (ref 12–46)
Lymphs Abs: 3.4 10*3/uL (ref 0.7–4.0)
Neutro Abs: 4.1 10*3/uL (ref 1.7–7.7)
Neutrophils Relative %: 50 % (ref 43–77)

## 2012-05-04 LAB — APTT: aPTT: 31 seconds (ref 24–37)

## 2012-05-04 MED ORDER — KETOROLAC TROMETHAMINE 15 MG/ML IJ SOLN: 15.0000 mg | Freq: Four times a day (QID) | INTRAMUSCULAR | Status: DC

## 2012-05-04 MED ORDER — ACETAMINOPHEN 325 MG PO TABS
650.0000 mg | ORAL_TABLET | Freq: Once | ORAL | Status: AC
  Administered 2012-05-04: 650 mg via ORAL
  Filled 2012-05-04: qty 2

## 2012-05-04 MED ORDER — ZOLPIDEM TARTRATE 5 MG PO TABS: 10.0000 mg | ORAL_TABLET | Freq: Every day | ORAL | Status: DC

## 2012-05-04 MED ORDER — CITALOPRAM HYDROBROMIDE 20 MG PO TABS
30.0000 mg | ORAL_TABLET | Freq: Every day | ORAL | Status: DC
  Administered 2012-05-04 – 2012-05-09 (×6): 30 mg via ORAL
  Filled 2012-05-04 (×7): qty 1

## 2012-05-04 MED ORDER — ZOLPIDEM TARTRATE 5 MG PO TABS: 5.0000 mg | ORAL_TABLET | Freq: Every evening | ORAL | Status: DC | PRN

## 2012-05-04 MED ORDER — KETOROLAC TROMETHAMINE 30 MG/ML IJ SOLN
INTRAMUSCULAR | Status: AC
  Administered 2012-05-04: 30 mg
  Filled 2012-05-04: qty 1

## 2012-05-04 MED ORDER — ZOLPIDEM TARTRATE 5 MG PO TABS: 5.0000 mg | ORAL_TABLET | Freq: Every day | ORAL | Status: DC

## 2012-05-04 MED ORDER — CLONAZEPAM 1 MG PO TABS
1.5000 mg | ORAL_TABLET | Freq: Every day | ORAL | Status: DC
  Administered 2012-05-04: 1.5 mg via ORAL
  Filled 2012-05-04: qty 3

## 2012-05-04 MED ORDER — ENOXAPARIN SODIUM 40 MG/0.4ML ~~LOC~~ SOLN
40.0000 mg | SUBCUTANEOUS | Status: DC
  Administered 2012-05-04 – 2012-05-13 (×10): 40 mg via SUBCUTANEOUS
  Filled 2012-05-04 (×12): qty 0.4

## 2012-05-04 MED ORDER — OLANZAPINE 7.5 MG PO TABS
15.0000 mg | ORAL_TABLET | Freq: Every day | ORAL | Status: DC
  Administered 2012-05-04 – 2012-05-09 (×6): 15 mg via ORAL
  Filled 2012-05-04 (×7): qty 2

## 2012-05-04 MED ORDER — THIOTHIXENE 2 MG PO CAPS
2.0000 mg | ORAL_CAPSULE | Freq: Two times a day (BID) | ORAL | Status: DC
  Administered 2012-05-04 – 2012-05-08 (×7): 2 mg via ORAL
  Filled 2012-05-04 (×9): qty 1

## 2012-05-04 MED ORDER — LAMOTRIGINE 200 MG PO TABS
200.0000 mg | ORAL_TABLET | Freq: Two times a day (BID) | ORAL | Status: DC
  Administered 2012-05-04 – 2012-05-14 (×19): 200 mg via ORAL
  Filled 2012-05-04 (×24): qty 1

## 2012-05-04 MED ORDER — HYDROXYZINE HCL 50 MG PO TABS
50.0000 mg | ORAL_TABLET | Freq: Three times a day (TID) | ORAL | Status: DC
  Administered 2012-05-04 – 2012-05-14 (×28): 50 mg via ORAL
  Filled 2012-05-04 (×32): qty 1

## 2012-05-04 MED ORDER — ONDANSETRON HCL 4 MG/2ML IJ SOLN
4.0000 mg | Freq: Once | INTRAMUSCULAR | Status: AC
  Administered 2012-05-04: 4 mg via INTRAVENOUS
  Filled 2012-05-04: qty 2

## 2012-05-04 MED ORDER — ASPIRIN 325 MG PO TABS
325.0000 mg | ORAL_TABLET | Freq: Every day | ORAL | Status: DC
  Administered 2012-05-04 – 2012-05-05 (×2): 325 mg via ORAL
  Filled 2012-05-04 (×3): qty 1

## 2012-05-04 MED ORDER — KETOROLAC TROMETHAMINE 30 MG/ML IJ SOLN
15.0000 mg | Freq: Four times a day (QID) | INTRAMUSCULAR | Status: AC | PRN
  Administered 2012-05-05 – 2012-05-08 (×6): 15 mg via INTRAVENOUS
  Filled 2012-05-04 (×5): qty 1

## 2012-05-04 MED ORDER — POTASSIUM CHLORIDE IN NACL 20-0.45 MEQ/L-% IV SOLN
INTRAVENOUS | Status: DC
  Administered 2012-05-04: 1000 mL via INTRAVENOUS
  Filled 2012-05-04 (×4): qty 1000

## 2012-05-04 MED ORDER — CLONIDINE HCL 0.1 MG PO TABS
0.1000 mg | ORAL_TABLET | Freq: Two times a day (BID) | ORAL | Status: DC
  Administered 2012-05-04: 0.1 mg via ORAL
  Filled 2012-05-04 (×3): qty 1

## 2012-05-04 MED ORDER — ASPIRIN 81 MG PO CHEW
324.0000 mg | CHEWABLE_TABLET | Freq: Once | ORAL | Status: AC
  Administered 2012-05-04: 324 mg via ORAL
  Filled 2012-05-04: qty 4

## 2012-05-04 NOTE — ED Notes (Signed)
Pt c/o numbness to left arm x5 days... Concerned about a poss stroke due to the fact that she has been out of her psych meds more than a month . No hx of strokes... Sx includes: Headache, anxiety, nauseas, diarrhea... Denies: fevers and vomiting, slurry speech... She is alert w/no signs of acute distress

## 2012-05-04 NOTE — ED Provider Notes (Signed)
History     CSN: [id number]  Arrival date & time 05/04/12  1419   First MD Initiated Contact with Patient 05/04/12 1640      Chief Complaint  Patient presents with  . Weakness    (Consider location/radiation/quality/duration/timing/severity/associated sxs/prior treatment) HPI Patient developed numbness of her left arm and dropping things with her left hand onset 4 days ago. Also complains of headache frontal in quality and nausea onset 4 days ago no other complaint. Seen at [facility] where head CT was ordered, sent here for further evaluation. No treatment prior to coming here symptoms constant, unchanged nothing makes symptoms better or worse. Admits to noncompliance with medications for approximately one month,. No treatment prior to coming here. Past Medical History  Diagnosis Date  . Hypertension   . Bipolar 1 disorder   . Schizo affective schizophrenia   . Depression, major    Hypercholesterolemia Past Surgical History  Procedure Date  . Cholecystectomy   . Abdominal hysterectomy     History reviewed. No pertinent family history.  History  Substance Use Topics  . Smoking status: Current Every Day Smoker  . Smokeless tobacco: Not on file  . Alcohol Use: Yes     Comment: ocassional   Positive marijuana use OB History    Grav Para Term Preterm Abortions TAB SAB Ect Mult Living                  Review of Systems  Constitutional: Negative.   Respiratory: Negative.   Cardiovascular: Negative.   Gastrointestinal: Positive for nausea.  Musculoskeletal: Negative.   Skin: Negative.   Neurological: Positive for numbness and headaches.  Hematological: Negative.   Psychiatric/Behavioral: Negative.   All other systems reviewed and are negative.    Allergies  Morphine and related  Home Medications   Current Outpatient Rx  Name  Route  Sig  Dispense  Refill  . CITALOPRAM HYDROBROMIDE 20 MG PO TABS   Oral   Take 30 mg by mouth daily.         Marland Kitchen CLONAZEPAM 1 MG PO TABS   Oral   Take 1.5 mg by mouth daily. Scheduled dose         . CLONIDINE HCL 0.1 MG PO TABS   Oral   Take 0.1 mg by mouth 2 (two) times daily.         Marland Kitchen HYDROXYZINE HCL 50 MG PO TABS   Oral   Take 50 mg by mouth 3 (three) times daily. Scheduled doses         . LAMOTRIGINE 200 MG PO TABS   Oral   Take 200 mg by mouth 2 (two) times daily.         Marland Kitchen LITHIUM CARBONATE 300 MG PO CAPS   Oral   Take 300 mg by mouth 2 (two) times daily with a meal.         . OLANZAPINE 15 MG PO TABS   Oral   Take 15 mg by mouth at bedtime.         . THIOTHIXENE 2 MG PO CAPS   Oral   Take 2 mg by mouth 2 (two) times daily.           BP 148/93  Pulse 97  Temp 98.4 F (36.9 C) (Oral)  Resp 18  SpO2 100%  Physical Exam  Nursing note and vitals reviewed. Constitutional: She is oriented to person, place, and time. She appears well-developed and well-nourished.  HENT:  Head: Normocephalic and atraumatic.  Eyes: Conjunctivae normal are normal. Pupils are equal, round, and reactive to light.  Neck: Neck supple. No tracheal deviation present. No thyromegaly present.  Cardiovascular: Normal rate and regular rhythm.   No murmur heard. Pulmonary/Chest: Effort normal and breath sounds normal.  Abdominal: Soft. Bowel sounds are normal. She exhibits no distension. There is no tenderness.  Musculoskeletal: Normal range of motion. She exhibits no edema and no tenderness.  Neurological: She is alert and oriented to person, place, and time. No cranial nerve deficit. Coordination normal.       Finger to nose normal heel to shin normal; motor strength 5 over 5 overall  Skin: Skin is warm and dry. No rash noted.  Psychiatric: She has a normal mood and affect.    ED Course  Procedures (including critical care time)  Date: 05/04/2012  Rate: 100  Rhythm: sinus tachycardia  QRS Axis: normal  Intervals: normal  ST/T Wave abnormalities: normal  Conduction  Disutrbances:none  Narrative Interpretation:   Old EKG Reviewed: EKG from 01/11/2012 showed normal sinus rhythm within normal limits otherwise unchanged as interpreted by me  Labs Reviewed  PROTIME-INR  APTT  CBC  DIFFERENTIAL  URINE RAPID DRUG SCREEN (HOSP PERFORMED)   Ct Head Wo Contrast  05/04/2012  *RADIOLOGY REPORT*  Clinical Data: Weakness.  Unable views left arm.  CT HEAD WITHOUT CONTRAST  Technique:  Contiguous axial images were obtained from the base of the skull through the vertex without contrast.  Comparison: CT head without contrast 08/09/2007.  Findings: Focal hypoattenuation involves the anterior insular cortex and frontal operculum.  There is no associated hemorrhage. There is focal sulcal effacement.  No other focal cortical infarct is evident.  Scattered subcortical white matter hypoattenuation is present bilaterally.  The ventricles are of normal size.  No significant extra-axial fluid collection is present.  The paranasal sinuses and mastoid air cells are clear.  The osseous skull is intact.  IMPRESSION:  1.  Interval right MCA territory infarct involving the insular cortex and inferior right frontal operculum.  This is likely acute. 2.  There is some local mass effect without significant midline shift. 3.  Scattered subcortical white matter hypoattenuation bilaterally likely reflects the sequelae of chronic microvascular ischemia.  These results were called by telephone on 05/04/2012 at 03:45 p.m. to Dr. Ranae Palms, who verbally acknowledged these results.   Original Report Authenticated By: Marin Roberts, M.D.      No diagnosis found.   Results for orders placed during the hospital encounter of 05/04/12  [facility]      Component Value Range   Prothrombin Time 12.1  11.6 - 15.2 seconds   INR 0.90  0.00 - 1.49  APTT      Component Value Range   aPTT 31  24 - 37 seconds  CBC      Component Value Range   WBC 8.1  4.0 - 10.5 K/uL   RBC 4.40  3.87 - 5.11 MIL/uL    Hemoglobin 13.0  12.0 - 15.0 g/dL   HCT 45.4  09.8 - 11.9 %   MCV 89.5  78.0 - 100.0 fL   MCH 29.5  26.0 - 34.0 pg   MCHC 33.0  30.0 - 36.0 g/dL   RDW 14.7  82.9 - 56.2 %   Platelets 297  150 - 400 K/uL  DIFFERENTIAL      Component Value Range   Neutrophils Relative 50  43 - 77 %   Neutro Abs  4.1  1.7 - 7.7 K/uL   Lymphocytes Relative 42  12 - 46 %   Lymphs Abs 3.4  0.7 - 4.0 K/uL   Monocytes Relative 7  3 - 12 %   Monocytes Absolute 0.5  0.1 - 1.0 K/uL   Eosinophils Relative 1  0 - 5 %   Eosinophils Absolute 0.0  0.0 - 0.7 K/uL   Basophils Relative 1  0 - 1 %   Basophils Absolute 0.1  0.0 - 0.1 K/uL  POCT I-STAT, CHEM 8      Component Value Range   Sodium 139  135 - 145 mEq/L   Potassium 3.6  3.5 - 5.1 mEq/L   Chloride 104  96 - 112 mEq/L   BUN 5 (*) 6 - 23 mg/dL   Creatinine, Ser 1.61  0.50 - 1.10 mg/dL   Glucose, Bld 99  70 - 99 mg/dL   Calcium, Ion 0.96  0.45 - 1.23 mmol/L   TCO2 24  0 - 100 mmol/L   Hemoglobin 14.3  12.0 - 15.0 g/dL   HCT 40.9  81.1 - 91.4 %   Ct Head Wo Contrast  05/04/2012  *RADIOLOGY REPORT*  Clinical Data: Weakness.  Unable views left arm.  CT HEAD WITHOUT CONTRAST  Technique:  Contiguous axial images were obtained from the base of the skull through the vertex without contrast.  Comparison: CT head without contrast 08/09/2007.  Findings: Focal hypoattenuation involves the anterior insular cortex and frontal operculum.  There is no associated hemorrhage. There is focal sulcal effacement.  No other focal cortical infarct is evident.  Scattered subcortical white matter hypoattenuation is present bilaterally.  The ventricles are of normal size.  No significant extra-axial fluid collection is present.  The paranasal sinuses and mastoid air cells are clear.  The osseous skull is intact.  IMPRESSION:  1.  Interval right MCA territory infarct involving the insular cortex and inferior right frontal operculum.  This is likely acute. 2.  There is some local mass effect  without significant midline shift. 3.  Scattered subcortical white matter hypoattenuation bilaterally likely reflects the sequelae of chronic microvascular ischemia.  These results were called by telephone on 05/04/2012 at 03:45 p.m. to Dr. Ranae Palms, who verbally acknowledged these results.   Original Report Authenticated By: Marin Roberts, M.D.     MDM  Patient to be admitted  Or placed on 23 hour observationfor remote stroke based on CT scan        Doug Sou, MD 05/04/12 1928

## 2012-05-04 NOTE — ED Notes (Signed)
Assessed pt with c/o LUE weakness/numbness x 3 days. Pt relates fear that she may be "having a stroke." Pt denies any previous stroke hx. Pt's grip strength appears to be normal and equal bilaterally. Pt exhibits facial muscle symmetry and pupils are equal diameter and responsive to light bilaterally. Pt's speech does not appear to be slurred. Pt c/o no paraesthesia other than LUE at this time.

## 2012-05-04 NOTE — ED Provider Notes (Signed)
History     CSN: [id number]  Arrival date & time 05/04/12  1108   First MD Initiated Contact with Patient 05/04/12 1326      Chief Complaint  Patient presents with  . Numbness    (Consider location/radiation/quality/duration/timing/severity/associated sxs/prior treatment) Patient is a 46 y.o. female presenting with extremity weakness. The history is provided by the patient.  Extremity Weakness This is a new problem. The current episode started more than 2 days ago. The problem occurs constantly. The problem has not changed since onset.Associated symptoms include headaches. Pertinent negatives include no chest pain, no abdominal pain and no shortness of breath. Nothing aggravates the symptoms. Nothing relieves the symptoms. She has tried nothing for the symptoms.  Patient reports increased episodes of dropping items since thanksgiving.  States she has had transient numbness in left arm.   Past Medical History  Diagnosis Date  . Hypertension   . Bipolar 1 disorder   . Schizo affective schizophrenia   . Depression, major     Past Surgical History  Procedure Date  . Cholecystectomy   . Abdominal hysterectomy     No family history on file.  History  Substance Use Topics  . Smoking status: Current Every Day Smoker  . Smokeless tobacco: Not on file  . Alcohol Use: Yes     Comment: ocassional    OB History    Grav Para Term Preterm Abortions TAB SAB Ect Mult Living                  Review of Systems  Respiratory: Negative for shortness of breath.   Cardiovascular: Negative for chest pain.  Gastrointestinal: Positive for nausea. Negative for abdominal pain.  Musculoskeletal: Positive for extremity weakness.  Neurological: Positive for weakness and headaches.  All other systems reviewed and are negative.    Allergies  Morphine and related  Home Medications   Current Outpatient Rx  Name  Route  Sig  Dispense  Refill  . CITALOPRAM HYDROBROMIDE 20 MG PO TABS  Oral   Take 30 mg by mouth daily.         Marland Kitchen CLONAZEPAM 1 MG PO TABS   Oral   Take 1.5 mg by mouth daily. Scheduled dose         . CLONIDINE HCL 0.1 MG PO TABS   Oral   Take 0.1 mg by mouth 2 (two) times daily.         Marland Kitchen HYDROXYZINE HCL 50 MG PO TABS   Oral   Take 50 mg by mouth 3 (three) times daily. Scheduled doses         . LAMOTRIGINE 200 MG PO TABS   Oral   Take 200 mg by mouth 2 (two) times daily.         Marland Kitchen LITHIUM CARBONATE 300 MG PO CAPS   Oral   Take 300 mg by mouth 2 (two) times daily with a meal.         . OLANZAPINE 15 MG PO TABS   Oral   Take 15 mg by mouth at bedtime.         . THIOTHIXENE 2 MG PO CAPS   Oral   Take 2 mg by mouth 2 (two) times daily.           BP 139/88  Pulse 98  Temp 99 F (37.2 C) (Oral)  Resp 20  SpO2 99%  Physical Exam  Nursing note and vitals reviewed. Constitutional: She is oriented to person,  place, and time. Vital signs are normal. She appears well-developed and well-nourished. She is active and cooperative.  HENT:  Head: Normocephalic.  Right Ear: External ear normal.  Left Ear: External ear normal.  Nose: Nose normal.  Mouth/Throat: Oropharynx is clear and moist. No oropharyngeal exudate.  Eyes: Conjunctivae normal and EOM are normal. Pupils are equal, round, and reactive to light. No scleral icterus.  Neck: Trachea normal and normal range of motion. Neck supple.  Cardiovascular: Normal rate, regular rhythm, normal heart sounds and intact distal pulses.   Pulmonary/Chest: Effort normal and breath sounds normal.  Abdominal: Soft. Bowel sounds are normal. There is no tenderness.  Musculoskeletal: Normal range of motion.  Lymphadenopathy:    She has no cervical adenopathy.  Neurological: She is alert and oriented to person, place, and time. She has normal strength. She displays normal reflexes. No cranial nerve deficit or sensory deficit. She exhibits normal muscle tone. Coordination and gait normal. GCS  eye subscore is 4. GCS verbal subscore is 5. GCS motor subscore is 6.  Skin: Skin is warm and dry.  Psychiatric: She has a normal mood and affect. Her speech is normal and behavior is normal. Judgment and thought content normal. Cognition and memory are normal.    ED Course  Procedures (including critical care time)  Labs Reviewed - No data to display No results found.   1. Arm weakness       MDM  Will transfer to [facility] to r/o intracranial abnormalities.  No abnormalities noted on neuro exam, less likely stroke, discussed with patient.          Johnsie Kindred, NP 05/04/12 1342

## 2012-05-04 NOTE — H&P (Addendum)
PCP:  Dartha Lodge   Chief Complaint:  Intermittent weakness left arm  HPI: This is a 46 year old female who states on Thursday while helping to prepare Thanksgiving dinner, noted she kept dropping things from her left hand. This has persisted, yesterday she dropped a plate of food. She reports no other abnormalities, no slurred speech, no difficulty swallowing, no difficulty walking, no altered mentation. The patient has been off her psych medications approximately a month, she wondered if her symptoms was due to withdrawal. She does have hypertension but she states she did continue take her blood pressure medications. In the ER the patient's blood pressure is normal. She states at home her blood pressure normally runs slightly elevated with systolic blood pressure in the low 140s. Patient came to the ER because of the persisting mild weakness of the left upper extremity. History provided by the patient who is alert and oriented. She doesn't report some headache but no blurred vision no tinnitus.  Review of Systems:  The patient denies anorexia, fever, weight loss,, vision loss, decreased hearing, hoarseness, chest pain, syncope, dyspnea on exertion, peripheral edema, balance deficits, hemoptysis, abdominal pain, melena, hematochezia, severe indigestion/heartburn, hematuria, incontinence, genital sores, muscle weakness, suspicious skin lesions, transient blindness, difficulty walking, depression, unusual weight change, abnormal bleeding, enlarged lymph nodes, angioedema, and breast masses.  Past Medical History: Past Medical History  Diagnosis Date  . Hypertension   . Bipolar 1 disorder   . Schizo affective schizophrenia   . Depression, major    Past Surgical History  Procedure Date  . Cholecystectomy   . Abdominal hysterectomy     Medications: Prior to Admission medications   Medication Sig Start Date End Date Taking? Authorizing Provider  citalopram (CELEXA) 20 MG tablet Take 30 mg  by mouth daily.   Yes Historical Provider, MD  clonazePAM (KLONOPIN) 1 MG tablet Take 1.5 mg by mouth daily. Scheduled dose   Yes Historical Provider, MD  cloNIDine (CATAPRES) 0.1 MG tablet Take 0.1 mg by mouth 2 (two) times daily.   Yes Historical Provider, MD  hydrOXYzine (ATARAX/VISTARIL) 50 MG tablet Take 50 mg by mouth 3 (three) times daily. Scheduled doses   Yes Historical Provider, MD  lamoTRIgine (LAMICTAL) 200 MG tablet Take 200 mg by mouth 2 (two) times daily.   Yes Historical Provider, MD  lithium carbonate 300 MG capsule Take 300 mg by mouth 2 (two) times daily with a meal.   Yes Historical Provider, MD  OLANZapine (ZYPREXA) 15 MG tablet Take 15 mg by mouth at bedtime.   Yes Historical Provider, MD  thiothixene (NAVANE) 2 MG capsule Take 2 mg by mouth 2 (two) times daily.   Yes Historical Provider, MD    Allergies:   Allergies  Allergen Reactions  . Morphine And Related Hives and Itching    Social History:  reports that she has been smoking.  She does not have any smokeless tobacco history on file. She reports that she drinks alcohol. She reports that she uses illicit drugs (Marijuana).  Family History: History reviewed. No pertinent family history.  Physical Exam: Filed Vitals:   05/04/12 1845 05/04/12 1933 05/04/12 2000 05/04/12 2125  BP: 126/78 135/82 125/76 145/89  Pulse: 91 97 90 75  Temp:    98.2 F (36.8 C)  TempSrc:    Oral  Resp: 24 19  20   SpO2: 99% 99% 97% 100%    General:  Alert and oriented times three, well developed and nourished, no acute distress Eyes: PERRLA, pink conjunctiva,  no scleral icterus ENT: Moist oral mucosa, neck supple, no thyromegaly Lungs: clear to ascultation, no wheeze, no crackles, no use of accessory muscles Cardiovascular: regular rate and rhythm, no regurgitation, no gallops, no murmurs. No carotid bruits, no JVD Abdomen: soft, positive BS, non-tender, non-distended, no organomegaly, not an acute abdomen GU: not  examined Neuro: CN II - XII grossly intact, sensation intact Musculoskeletal: strength 5/5 all extremities, no clubbing, cyanosis or edema Skin: no rash, no subcutaneous crepitation, no decubitus Psych: appropriate patient   Labs on Admission:   Basename 05/04/12 1756  NA 139  K 3.6  CL 104  CO2 --  GLUCOSE 99  BUN 5*  CREATININE 0.80  CALCIUM --  MG --  PHOS --   No results found for this basename: AST:2,ALT:2,ALKPHOS:2,BILITOT:2,PROT:2,ALBUMIN:2 in the last 72 hours No results found for this basename: LIPASE:2,AMYLASE:2 in the last 72 hours  Basename 05/04/12 1756 05/04/12 1733  WBC -- 8.1  NEUTROABS -- 4.1  HGB 14.3 13.0  HCT 42.0 39.4  MCV -- 89.5  PLT -- 297   No results found for this basename: CKTOTAL:3,CKMB:3,CKMBINDEX:3,TROPONINI:3 in the last 72 hours No components found with this basename: POCBNP:3 No results found for this basename: DDIMER:2 in the last 72 hours No results found for this basename: HGBA1C:2 in the last 72 hours No results found for this basename: CHOL:2,HDL:2,LDLCALC:2,TRIG:2,CHOLHDL:2,LDLDIRECT:2 in the last 72 hours No results found for this basename: TSH,T4TOTAL,FREET3,T3FREE,THYROIDAB in the last 72 hours No results found for this basename: VITAMINB12:2,FOLATE:2,FERRITIN:2,TIBC:2,IRON:2,RETICCTPCT:2 in the last 72 hours  Micro Results: No results found for this or any previous visit (from the past 240 hour(s)).   Radiological Exams on Admission: Ct Head Wo Contrast  05/04/2012  *RADIOLOGY REPORT*  Clinical Data: Weakness.  Unable views left arm.  CT HEAD WITHOUT CONTRAST  Technique:  Contiguous axial images were obtained from the base of the skull through the vertex without contrast.  Comparison: CT head without contrast 08/09/2007.  Findings: Focal hypoattenuation involves the anterior insular cortex and frontal operculum.  There is no associated hemorrhage. There is focal sulcal effacement.  No other focal cortical infarct is evident.   Scattered subcortical white matter hypoattenuation is present bilaterally.  The ventricles are of normal size.  No significant extra-axial fluid collection is present.  The paranasal sinuses and mastoid air cells are clear.  The osseous skull is intact.  IMPRESSION:  1.  Interval right MCA territory infarct involving the insular cortex and inferior right frontal operculum.  This is likely acute. 2.  There is some local mass effect without significant midline shift. 3.  Scattered subcortical white matter hypoattenuation bilaterally likely reflects the sequelae of chronic microvascular ischemia.  These results were called by telephone on 05/04/2012 at 03:45 p.m. to Dr. Ranae Palms, who verbally acknowledged these results.   Original Report Authenticated By: Marin Roberts, M.D.     EKG: NSR  Assessment/Plan Present on Admission:  . CVA (cerebral infarction) Admit to 3 neuro  CVA patient orderset used Neuro hospitalist consulted Given patient's young age there is concern for patent foramen ovale, echo ordered. Will defer to a.m. team and neurohospitalist if TEE needed. Hypercoagulable workup also initiated. Patient on aspirin  . Hypertension  schizoaffective bipolar type Home medication resume  DO NOT RESUSCITATE DVT prophylaxis  April [name] 05/04/2012, 9:37 PM

## 2012-05-04 NOTE — ED Provider Notes (Signed)
7:18 PM Pt has moved to CDU holding for admission for R MCA infarct pending labs.  Sign out received from Dr Ethelda Chick.  Pt with right arm numbness and weakness ("dropping things" ) that began 4 days ago.  Found to have right MCA stroke on CT.  Pt reports she is currently asymptomatic.  No needs at this time.  Pt understands that she has had a stroke and that she is to be admitted to the hospital.  Labs are unremarkable.  I have spoken with Dr Roseanne Reno of neurology who will consult.  I have paged unassigned medicine for admission.    7:36 PM Admitted to [facility] hospitalist who will see pt now in CDU.    Results for orders placed during the hospital encounter of 05/04/12  PROTIME-INR      Component Value Range   Prothrombin Time 12.1  11.6 - 15.2 seconds   INR 0.90  0.00 - 1.49  APTT      Component Value Range   aPTT 31  24 - 37 seconds  CBC      Component Value Range   WBC 8.1  4.0 - 10.5 K/uL   RBC 4.40  3.87 - 5.11 MIL/uL   Hemoglobin 13.0  12.0 - 15.0 g/dL   HCT 18.5  63.1 - 49.7 %   MCV 89.5  78.0 - 100.0 fL   MCH 29.5  26.0 - 34.0 pg   MCHC 33.0  30.0 - 36.0 g/dL   RDW 02.6  37.8 - 58.8 %   Platelets 297  150 - 400 K/uL  DIFFERENTIAL      Component Value Range   Neutrophils Relative 50  43 - 77 %   Neutro Abs 4.1  1.7 - 7.7 K/uL   Lymphocytes Relative 42  12 - 46 %   Lymphs Abs 3.4  0.7 - 4.0 K/uL   Monocytes Relative 7  3 - 12 %   Monocytes Absolute 0.5  0.1 - 1.0 K/uL   Eosinophils Relative 1  0 - 5 %   Eosinophils Absolute 0.0  0.0 - 0.7 K/uL   Basophils Relative 1  0 - 1 %   Basophils Absolute 0.1  0.0 - 0.1 K/uL  POCT I-STAT, CHEM 8      Component Value Range   Sodium 139  135 - 145 mEq/L   Potassium 3.6  3.5 - 5.1 mEq/L   Chloride 104  96 - 112 mEq/L   BUN 5 (*) 6 - 23 mg/dL   Creatinine, Ser 5.02  0.50 - 1.10 mg/dL   Glucose, Bld 99  70 - 99 mg/dL   Calcium, Ion 7.74  1.28 - 1.23 mmol/L   TCO2 24  0 - 100 mmol/L   Hemoglobin 14.3  12.0 - 15.0 g/dL   HCT 78.6   76.7 - 20.9 %   Ct Head Wo Contrast  05/04/2012  *RADIOLOGY REPORT*  Clinical Data: Weakness.  Unable views left arm.  CT HEAD WITHOUT CONTRAST  Technique:  Contiguous axial images were obtained from the base of the skull through the vertex without contrast.  Comparison: CT head without contrast 08/09/2007.  Findings: Focal hypoattenuation involves the anterior insular cortex and frontal operculum.  There is no associated hemorrhage. There is focal sulcal effacement.  No other focal cortical infarct is evident.  Scattered subcortical white matter hypoattenuation is present bilaterally.  The ventricles are of normal size.  No significant extra-axial fluid collection is present.  The paranasal sinuses and mastoid  air cells are clear.  The osseous skull is intact.  IMPRESSION:  1.  Interval right MCA territory infarct involving the insular cortex and inferior right frontal operculum.  This is likely acute. 2.  There is some local mass effect without significant midline shift. 3.  Scattered subcortical white matter hypoattenuation bilaterally likely reflects the sequelae of chronic microvascular ischemia.  These results were called by telephone on 05/04/2012 at 03:45 p.m. to Dr. Ranae Palms, who verbally acknowledged these results.   Original Report Authenticated By: Marin Roberts, M.D.       [place], [place] 05/04/12 1936

## 2012-05-04 NOTE — ED Notes (Signed)
Pt brought to room; pt placed on monitor, continuous pulse oximetry and blood pressure cuff; family at bedside 

## 2012-05-04 NOTE — Consult Note (Signed)
Referring Physician: Dr. Rennis Chris    Chief Complaint: Weakness and numbness involving left upper extremity.  HPI: April [name] is an 46 y.o. female with a history of hypertension, bipolar disorder and schizoaffective disorder, presenting with weakness and numbness involving left upper extremity since 04/30/2012. Patient noticed onset of symptoms around noon of that day. She's had slight tingling involving left lower face as well. She's had no changes in speech. She has not noticed any left lower extremity weakness. Patient has not been on antiplatelet therapy. She has no previous history of stroke nor TIA. CT scan of her head showed right MCA territory lesion consistent with recent infarction, including mild signs of edema without mass effect. NIH stroke score was 2.  LSN: Noon on 04/30/2012 tPA Given: No: Beyond time under for treatment consideration MRankin: 0  Past Medical History  Diagnosis Date  . Hypertension   . Bipolar 1 disorder   . Schizo affective schizophrenia   . Depression, major     History reviewed. No pertinent family history.   Medications:  Prior to Admission:  Celexa 30 mg per day clonazepam 1.5 mg per day Catapres 0.1 mg twice a day Hydroxyzine 50 mg 3 times a day Lamictal 200 mg twice a day Lithium carbonate 300 mg twice a day Zyprexa 15 mg at bedtime Thiothixene 2 mg daily  Physical Examination: Blood pressure 135/82, pulse 97, temperature 98.4 F (36.9 C), temperature source Oral, resp. rate 19, SpO2 99.00%.  Neurologic Examination: Mental Status: Alert, oriented, thought content appropriate.  Speech fluent without evidence of aphasia. Able to follow commands without difficulty. Cranial Nerves: II-Visual fields were normal. III/IV/VI-Pupils were equal and reacted. Extraocular movements were full and conjugate.    V/VII-no facial numbness; slight left lower facial weakness. VIII-normal. X-normal speech and symmetrical palatal movement. XII-midline  tongue extension Motor: Left upper extremity pronator drift as well as reduced grip strength of the left hand. Motor exam is otherwise unremarkable. Sensory: Normal throughout. Deep Tendon Reflexes: 2+ and symmetric. Plantars: Flexor bilaterally Cerebellar: Normal finger-to-nose testing. Carotid auscultation: Normal   Ct Head Wo Contrast  05/04/2012  *RADIOLOGY REPORT*  Clinical Data: Weakness.  Unable views left arm.  CT HEAD WITHOUT CONTRAST  Technique:  Contiguous axial images were obtained from the base of the skull through the vertex without contrast.  Comparison: CT head without contrast 08/09/2007.  Findings: Focal hypoattenuation involves the anterior insular cortex and frontal operculum.  There is no associated hemorrhage. There is focal sulcal effacement.  No other focal cortical infarct is evident.  Scattered subcortical white matter hypoattenuation is present bilaterally.  The ventricles are of normal size.  No significant extra-axial fluid collection is present.  The paranasal sinuses and mastoid air cells are clear.  The osseous skull is intact.  IMPRESSION:  1.  Interval right MCA territory infarct involving the insular cortex and inferior right frontal operculum.  This is likely acute. 2.  There is some local mass effect without significant midline shift. 3.  Scattered subcortical white matter hypoattenuation bilaterally likely reflects the sequelae of chronic microvascular ischemia.  These results were called by telephone on 05/04/2012 at 03:45 p.m. to Dr. Ranae Palms, who verbally acknowledged these results.   Original Report Authenticated By: Marin Roberts, M.D.     Assessment: 46 y.o. female presenting with subacute right MCA territory ischemic infarction.  Stroke Risk Factors - hypertension  Plan: 1. HgbA1c, fasting lipid panel 2. MRI, MRA  of the brain without contrast 3. PT consult, OT consult, Speech  consult 4. Echocardiogram 5. Carotid dopplers 6. Prophylactic  therapy-Antiplatelet med: Aspirin 81 mg per day 7. Risk factor modification 8. Telemetry monitoring   C.R. Roseanne Reno, MD [facility] [phone]  05/04/2012, 8:11 PM

## 2012-05-04 NOTE — ED Notes (Signed)
MD at bedside. 

## 2012-05-04 NOTE — ED Notes (Signed)
Pt reports weakness in L arm since Thanksgiving, noticed it while making deviled eggs. Since onset reports she has had trouble holding and lifting with L hand. Pt denies weakness in legs or R arm. Reports nausea and mild headache over past week also. [facility] and they sent for CT head.  States she stopped taking psych meds over past month due to unable to obtain refills so pt feels this may be causing her symptoms

## 2012-05-05 DIAGNOSIS — F259 Schizoaffective disorder, unspecified: Secondary | ICD-10-CM

## 2012-05-05 LAB — LIPID PANEL
Cholesterol: 232 mg/dL — ABNORMAL HIGH (ref 0–200)
HDL: 27 mg/dL — ABNORMAL LOW (ref >39)
Triglycerides: 274 mg/dL — ABNORMAL HIGH (ref <150)

## 2012-05-05 LAB — HEMOGLOBIN A1C
Hgb A1c MFr Bld: 6.3 % — ABNORMAL HIGH (ref <5.7)
Mean Plasma Glucose: 134 mg/dL — ABNORMAL HIGH (ref <117)

## 2012-05-05 LAB — RPR: RPR Ser Ql: NONREACTIVE

## 2012-05-05 LAB — SEDIMENTATION RATE: Sed Rate: 14 mm/hr (ref 0–22)

## 2012-05-05 MED ORDER — LORAZEPAM 1 MG PO TABS
2.0000 mg | ORAL_TABLET | Freq: Once | ORAL | Status: AC
  Administered 2012-05-05: 2 mg via ORAL
  Filled 2012-05-05: qty 2

## 2012-05-05 MED ORDER — CLONAZEPAM 1 MG PO TABS
1.0000 mg | ORAL_TABLET | Freq: Every day | ORAL | Status: DC
  Administered 2012-05-05 – 2012-05-14 (×10): 1 mg via ORAL
  Filled 2012-05-05 (×11): qty 1

## 2012-05-05 MED ORDER — SODIUM CHLORIDE 0.9 % IV SOLN
INTRAVENOUS | Status: AC
  Administered 2012-05-05: 10:00:00 via INTRAVENOUS

## 2012-05-05 MED ORDER — ATORVASTATIN CALCIUM 20 MG PO TABS
20.0000 mg | ORAL_TABLET | Freq: Every day | ORAL | Status: DC
  Administered 2012-05-05 – 2012-05-13 (×9): 20 mg via ORAL
  Filled 2012-05-05 (×10): qty 1

## 2012-05-05 MED ORDER — ONDANSETRON HCL 4 MG/2ML IJ SOLN
4.0000 mg | Freq: Four times a day (QID) | INTRAMUSCULAR | Status: DC | PRN
  Administered 2012-05-05 – 2012-05-08 (×7): 4 mg via INTRAVENOUS
  Filled 2012-05-05 (×7): qty 2

## 2012-05-05 MED ORDER — NICOTINE 21 MG/24HR TD PT24
21.0000 mg | MEDICATED_PATCH | Freq: Every day | TRANSDERMAL | Status: DC
  Administered 2012-05-05 – 2012-05-14 (×10): 21 mg via TRANSDERMAL
  Filled 2012-05-05 (×10): qty 1

## 2012-05-05 NOTE — Evaluation (Signed)
Physical Therapy Evaluation Patient Details Name: April [name] MRN: [id number] DOB: 02/12/1966 Today's Date: 05/05/2012 Time: 4098-1191 PT Time Calculation (min): 19 min  PT Assessment / Plan / Recommendation Clinical Impression  Pt. admitted s/p R MCA CVA resulting in LUE weakness, h/o HTN, depression, and bipolar disorder; Pt. would benefit from acute PT to address activity tolerance with mobility so pt. may retrun to PLOF. Possible only 1x tx to address ambulation and stairs; stairs not tested today secondary to pt. feeling dizzy. Pt. with NIHSS=2. Pt. educated on stroke risk factors such as smoking, HTN, lack of exercise, and hypercholesterolemia; pt. undertsanding of risk factors.     PT Assessment  Patient needs continued PT services    Follow Up Recommendations  No PT follow up       Barriers to Discharge None      Equipment Recommendations  None recommended by PT       Frequency Min 3X/week    Precautions / Restrictions Precautions Precautions: None Restrictions Weight Bearing Restrictions: No   Pertinent Vitals/Pain No pain reported      Mobility  Bed Mobility Bed Mobility: Supine to Sit;Sitting - Scoot to Edge of Bed Supine to Sit: HOB elevated;6: Modified independent (Device/Increase time) (20 degrees) Sitting - Scoot to Edge of Bed: 7: Independent Transfers Sit to Stand: 7: Independent Stand to Sit: 7: Independent Ambulation/Gait Ambulation/Gait Assistance: 4: Min guard Ambulation Distance (Feet): 250 Feet Assistive device: None Ambulation/Gait Assistance Details: Pt. complained of feeling dizzy and needed to take seated rest break.  Gait Pattern: Within Functional Limits Gait velocity: decreased Stairs: No (Pt. complaining of dizziness) Modified Rankin (Stroke Patients Only) Pre-Morbid Rankin Score: No symptoms Modified Rankin: No significant disability           PT Diagnosis: Difficulty walking  PT Problem List: Decreased activity  tolerance;Decreased mobility PT Treatment Interventions: DME instruction;Gait training;Stair training;Functional mobility training;Therapeutic activities;Therapeutic exercise;Patient/family education   PT Goals Acute Rehab PT Goals PT Goal Formulation: With patient Time For Goal Achievement: 05/12/12 Potential to Achieve Goals: Good Pt will Ambulate: >150 feet;with modified independence PT Goal: Ambulate - Progress: Goal set today Pt will Go Up / Down Stairs: 3-5 stairs;with modified independence PT Goal: Up/Down Stairs - Progress: Goal set today Pt will Perform Home Exercise Program: Independently PT Goal: Perform Home Exercise Program - Progress: Goal set today  Visit Information  Last PT Received On: 05/05/12 Assistance Needed: +1    Subjective Data  Subjective: "I've been able to walk fine." Patient Stated Goal: Return home   Prior Functioning  Home Living Lives With: Friend(s) Available Help at Discharge: Friend(s);Available 24 hours/day Type of Home: House Home Access: Stairs to enter [employer] of Steps: 3 Entrance Stairs-Rails: None Home Layout: One level Bathroom Shower/Tub: Network engineer: None Prior Function Level of Independence: Independent Able to Take Stairs?: Yes Driving: Yes Vocation: On disability Communication Communication: No difficulties Dominant Hand: Right    Cognition  Overall Cognitive Status: Appears within functional limits for tasks assessed/performed Arousal/Alertness: Awake/alert Orientation Level: Appears intact for tasks assessed Behavior During Session: [facility] for tasks performed    Extremity/Trunk Assessment Right Upper Extremity Assessment RUE ROM/Strength/Tone: [facility] for tasks assessed Left Upper Extremity Assessment LUE ROM/Strength/Tone: Deficits LUE ROM/Strength/Tone Deficits: Elbow flexion 3+; elbow extension 3+; grip decreased compared to R Right Lower Extremity  Assessment RLE ROM/Strength/Tone: [facility] for tasks assessed (4+ hip flexion, knee flexion, and knee extension) Left Lower Extremity Assessment LLE ROM/Strength/Tone: [facility] for  tasks assessed (4+ hip flexion, knee flexion, and knee extension) Trunk Assessment Trunk Assessment: Normal      End of Session PT - End of Session Activity Tolerance: Patient tolerated treatment well Patient left: in chair;with call bell/phone within reach;with family/visitor present Nurse Communication: Mobility status    Army Chaco SPT 05/05/2012, 3:32 PM

## 2012-05-05 NOTE — Progress Notes (Signed)
   05/05/12 1534  PT G-Codes **NOT FOR INPATIENT CLASS**  Functional Assessment Tool Used clinical judgement  Functional Limitation Carrying, moving and handling objects  Carrying, Moving and Handling Objects Current Status (W0981) CI  Carrying, Moving and Handling Objects Goal Status (X9147) CH  continuation of note from SPT Alexis Mizuno Abner Greenspan, PT [phone]

## 2012-05-05 NOTE — ED Provider Notes (Signed)
Medical screening examination/treatment/procedure(s) were performed by non-physician practitioner and as supervising physician I was immediately available for consultation/collaboration.  Leslee Home, M.D.   Reuben Likes, MD 05/05/12 [phone]

## 2012-05-05 NOTE — Progress Notes (Signed)
[facility]                                                                                Patient Demographics  April [name], is a 46 y.o. female  CSN:[id number]  MRN:[id number]  DOB - 07/30/1965  Admit date - 05/04/2012  Admitting Physician Debby Crosley, MD  Outpatient Primary MD for the patient is No primary provider on file.  LOS - 1   Chief Complaint  Patient presents with  . Weakness        Assessment & Plan    1.CVA - with a left-sided deficits, MRA brain noted, discussed with neurologist Dr. Sethi, at this point he does not recommend any surgical intervention or consult, we will continue to monitor her on tele, obtain PT, OT, speech and put, A1c and lipid panel, MRI MRA brain, echogram, carotid duplex, neurology input. Antiplatelet therapy per neurology for now is aspirin, LDL is over 100 placed on statin of note she is on Zocor at home which is missing in her home medications.   No results found for this basename: HGBA1C    Lab Results  Component Value Date   CHOL 232* 05/05/2012   HDL 27* 05/05/2012   LDLCALC 150* 05/05/2012   TRIG 274* 05/05/2012   CHOLHDL 8.6 05/05/2012    2. History of tobacco abuse counseled to quit smoking.    3. Hypertension. Currently hypotensive, will discontinue her home dose clonidine, gentle normal saline and monitor. Have minimize her benzos.     4. History of schizoaffective disorder and bipolar disorder. Home medications to be continued, no acute issues.     Code Status: Home  Family Communication: He should in her brother  Disposition Plan: home    Procedures MRI MRA brain, echogram, carotid duplex   Consults  neurology   Time Spent in minutes   35   Antibiotics    Anti-infectives    None      Scheduled Meds:   . [COMPLETED] acetaminophen  650 mg Oral Once  . [COMPLETED] aspirin  324 mg Oral Once  . aspirin  325 mg Oral Daily  . atorvastatin  20 mg Oral q1800  . citalopram  30  mg Oral Daily  . clonazePAM  1 mg Oral Daily  . enoxaparin  40 mg Subcutaneous Q24H  . hydrOXYzine  50 mg Oral TID  . [COMPLETED] ketorolac      . lamoTRIgine  200 mg Oral BID  . OLANZapine  15 mg Oral QHS  . [COMPLETED] ondansetron  4 mg Intravenous Once  . thiothixene  2 mg Oral BID  . [DISCONTINUED] clonazePAM  1.5 mg Oral Daily  . [DISCONTINUED] cloNIDine  0.1 mg Oral BID  . [DISCONTINUED] ketorolac  15 mg Intravenous Q6H  . [DISCONTINUED] zolpidem  10 mg Oral QHS  . [DISCONTINUED] zolpidem  5 mg Oral QHS   Continuous Infusions:   . sodium chloride    . [DISCONTINUED] 0.45 % NaCl with KCl 20 mEq / L 1,000 mL (05/04/12 2315)   PRN Meds:.ketorolac, ondansetron, zolpidem   DVT Prophylaxis  Lovenox   Lab Results  Component Value Date   PLT 311 05/04/2012        SINGH,PRASHANT K M.D on 05/05/2012 at 9:17 AM  Between 7am to 7pm - Pager - [phone]  After 7pm go to [email or web address] - password [email or web address]  And look for the night coverage person covering for me after hours  [facility] Office  [phone]    Subjective:   April [name] today has, No headache, No chest pain, No abdominal pain - No Nausea, No new weakness tingling or numbness, No Cough - SOB. l side is weak.  Objective:   Filed Vitals:   05/04/12 2125 05/04/12 2355 05/05/12 0233 05/05/12 0555  BP: 145/89 145/89 112/57 95/58  Pulse: 75 75 72 72  Temp: 98.2 F (36.8 C) 98.2 F (36.8 C) 97.3 F (36.3 C) 97.8 F (36.6 C)  TempSrc: Oral Oral Oral Oral  Resp: 20 20 20 20  SpO2: 100% 100% 96% 97%    Wt Readings from Last 3 Encounters:  No data found for Wt    No intake or output data in the 24 hours ending 05/05/12 0917  Exam Awake Alert, Oriented X 3, No new F.N deficits, l sided strength 4/5 arm weaker than leg, Normal affect [place].AT,PERRAL Supple Neck,No JVD, No cervical lymphadenopathy appriciated.  Symmetrical Chest wall movement, Good air movement bilaterally, CTAB RRR,No Gallops,Rubs  or new Murmurs, No Parasternal Heave +ve B.Sounds, Abd Soft, Non tender, No organomegaly appriciated, No rebound - guarding or rigidity. No Cyanosis, Clubbing or edema, No new Rash or bruise    Data Review   Micro Results No results found for this or any previous visit (from the past 240 hour(s)).  Radiology Reports Ct Head Wo Contrast  05/04/2012  *RADIOLOGY REPORT*  Clinical Data: Weakness.  Unable views left arm.  CT HEAD WITHOUT CONTRAST  Technique:  Contiguous axial images were obtained from the base of the skull through the vertex without contrast.  Comparison: CT head without contrast 08/09/2007.  Findings: Focal hypoattenuation involves the anterior insular cortex and frontal operculum.  There is no associated hemorrhage. There is focal sulcal effacement.  No other focal cortical infarct is evident.  Scattered subcortical white matter hypoattenuation is present bilaterally.  The ventricles are of normal size.  No significant extra-axial fluid collection is present.  The paranasal sinuses and mastoid air cells are clear.  The osseous skull is intact.  IMPRESSION:  1.  Interval right MCA territory infarct involving the insular cortex and inferior right frontal operculum.  This is likely acute. 2.  There is some local mass effect without significant midline shift. 3.  Scattered subcortical white matter hypoattenuation bilaterally likely reflects the sequelae of chronic microvascular ischemia.  These results were called by telephone on 05/04/2012 at 03:45 p.m. to Dr. Yelverton, who verbally acknowledged these results.   Original Report Authenticated By: Christopher Mattern, M.D.    Mri Brain Without Contrast  05/05/2012  *RADIOLOGY REPORT*  Clinical Data:  Left arm weakness.  Hypertension.  MRI BRAIN WITHOUT CONTRAST MRA HEAD WITHOUT CONTRAST  Technique: Multiplanar, multiecho pulse sequences of the brain and surrounding structures were obtained according to standard protocol without intravenous  contrast.  Angiographic images of the head were obtained using MRA technique without contrast.  Comparison: 05/04/2012 head CT.  No comparison brain MR.  MRI HEAD  Findings:  Motion degraded exam.  Acute non hemorrhagic right hemispheric infarcts involving portions of the right opercular region, right sub insular region, right frontal lobe and right parietal lobe. Mild mass effect with slight compression of the right lateral ventricle without midline shift.  No   intracranial hemorrhage.  No intracranial mass lesion detected on this unenhanced exam.  No hydrocephalus.  Partially empty sella.  Minimal asymmetry of the hippocampi.  IMPRESSION: Acute total right hemispheric infarct as noted above.  MRA HEAD  Findings: Motion degraded exam.  Abrupt cut off left flow at the right carotid terminus with very few right middle cerebral artery branch vessels visualized.  Poor delineation of a majority of the anterior cerebral arteries bilaterally.  Small caliber left internal carotid arteries suggesting proximal stenosis with occlusion or significant narrowing of the left internal carotid artery after the takeoff of the left ophthalmic artery.  Poor delineation of a majority of the left middle cerebral artery branches which appear significantly narrowed irregular.  On source sequence, increased number of vessels in the suprasellar cistern and ambient cistern.  Increased vasculature right occipital lobe.  On FLAIR imaging, altered flow is seen within intracranial vasculature.  These findings may represent result of slow flow from prominent intracranial atherosclerotic type changes and subsequent collateral flow.  Vascular malformation not excluded.  The patient would benefit from catheter angiography.  Right vertebral artery is dominant.  Irregularity of both vertebral arteries with mild narrowing.  Poor delineation of the PICAs.  Small caliber basilar artery without high-grade stenosis.  Markedly irregular appearance of the  superior cerebral arteries and posterior cerebral arteries with poor delineation of the majority of the left posterior cerebral artery.  High-grade stenosis proximal right posterior cerebral artery.  Limited for detection of aneurysm.  IMPRESSION: Markedly abnormal motion degraded MR angiogram of the circle Willis.  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  Small caliber left internal carotid artery suggesting proximal stenosis with occlusion or high-grade stenosis of the left internal carotid artery after takeoff of left ophthalmic artery.  Significant increased vasculature as seen on source imaging may represent result of collateral flow although vascular malformation is not excluded and catheter angiogram of the neck and intracranial vasculature may be considered.  This has been made a PRA call report utilizing dashboard call feature.   Original Report Authenticated By: Steven Olson, M.D.    Mr Mra Head/brain Wo Cm  05/05/2012  *RADIOLOGY REPORT*  Clinical Data:  Left arm weakness.  Hypertension.  MRI BRAIN WITHOUT CONTRAST MRA HEAD WITHOUT CONTRAST  Technique: Multiplanar, multiecho pulse sequences of the brain and surrounding structures were obtained according to standard protocol without intravenous contrast.  Angiographic images of the head were obtained using MRA technique without contrast.  Comparison: 05/04/2012 head CT.  No comparison brain MR.  MRI HEAD  Findings:  Motion degraded exam.  Acute non hemorrhagic right hemispheric infarcts involving portions of the right opercular region, right sub insular region, right frontal lobe and right parietal lobe. Mild mass effect with slight compression of the right lateral ventricle without midline shift.  No intracranial hemorrhage.  No intracranial mass lesion detected on this unenhanced exam.  No hydrocephalus.  Partially empty sella.  Minimal asymmetry of the hippocampi.  IMPRESSION: Acute total right  hemispheric infarct as noted above.  MRA HEAD  Findings: Motion degraded exam.  Abrupt cut off left flow at the right carotid terminus with very few right middle cerebral artery branch vessels visualized.  Poor delineation of a majority of the anterior cerebral arteries bilaterally.  Small caliber left internal carotid arteries suggesting proximal stenosis with occlusion or significant narrowing of the left internal carotid artery after the takeoff of the left ophthalmic artery.  Poor delineation of   a majority of the left middle cerebral artery branches which appear significantly narrowed irregular.  On source sequence, increased number of vessels in the suprasellar cistern and ambient cistern.  Increased vasculature right occipital lobe.  On FLAIR imaging, altered flow is seen within intracranial vasculature.  These findings may represent result of slow flow from prominent intracranial atherosclerotic type changes and subsequent collateral flow.  Vascular malformation not excluded.  The patient would benefit from catheter angiography.  Right vertebral artery is dominant.  Irregularity of both vertebral arteries with mild narrowing.  Poor delineation of the PICAs.  Small caliber basilar artery without high-grade stenosis.  Markedly irregular appearance of the superior cerebral arteries and posterior cerebral arteries with poor delineation of the majority of the left posterior cerebral artery.  High-grade stenosis proximal right posterior cerebral artery.  Limited for detection of aneurysm.  IMPRESSION: Markedly abnormal motion degraded MR angiogram of the circle Willis.  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  Small caliber left internal carotid artery suggesting proximal stenosis with occlusion or high-grade stenosis of the left internal carotid artery after takeoff of left ophthalmic artery.  Significant increased vasculature as seen on source imaging may  represent result of collateral flow although vascular malformation is not excluded and catheter angiogram of the neck and intracranial vasculature may be considered.  This has been made a PRA call report utilizing dashboard call feature.   Original Report Authenticated By: Steven Olson, M.D.     CBC  Lab 05/04/12 2137 05/04/12 1756 05/04/12 1733  WBC 9.0 -- 8.1  HGB 13.1 14.3 13.0  HCT 39.3 42.0 39.4  PLT 311 -- 297  MCV 89.1 -- 89.5  MCH 29.7 -- 29.5  MCHC 33.3 -- 33.0  RDW 13.6 -- 13.6  LYMPHSABS -- -- 3.4  MONOABS -- -- 0.5  EOSABS -- -- 0.0  BASOSABS -- -- 0.1  BANDABS -- -- --    Chemistries   Lab 05/04/12 2137 05/04/12 1756  NA -- 139  K -- 3.6  CL -- 104  CO2 -- --  GLUCOSE -- 99  BUN -- 5*  CREATININE 0.79 0.80  CALCIUM -- --  MG -- --  AST -- --  ALT -- --  ALKPHOS -- --  BILITOT -- --   ------------------------------------------------------------------------------------------------------------------ CrCl is unknown because there is no height on file for the current visit. ------------------------------------------------------------------------------------------------------------------ No results found for this basename: HGBA1C:2 in the last 72 hours ------------------------------------------------------------------------------------------------------------------  Basename 05/05/12 0550  CHOL 232*  HDL 27*  LDLCALC 150*  TRIG 274*  CHOLHDL 8.6  LDLDIRECT --   ------------------------------------------------------------------------------------------------------------------ No results found for this basename: TSH,T4TOTAL,FREET3,T3FREE,THYROIDAB in the last 72 hours ------------------------------------------------------------------------------------------------------------------ No results found for this basename: VITAMINB12:2,FOLATE:2,FERRITIN:2,TIBC:2,IRON:2,RETICCTPCT:2 in the last 72 hours  Coagulation profile  Lab 05/04/12 1733  INR 0.90   PROTIME --    No results found for this basename: DDIMER:2 in the last 72 hours  Cardiac Enzymes No results found for this basename: CK:3,CKMB:3,TROPONINI:3,MYOGLOBIN:3 in the last 168 hours ------------------------------------------------------------------------------------------------------------------ No components found with this basename: POCBNP:3    

## 2012-05-05 NOTE — Progress Notes (Signed)
Approximately 2040 nurse took the patient to the bathroom and told to use the call bell once ready to get back to bed. Nurse specifically told patient not to get up by herself and wait for the staff to come and help her. Patient agreed to do so. Patient did use the call bell when she was ready but decided to get up by herself and wash her hands, took few steps and hit the floor on her knees. Family was at the bed side. Nurse found the patient on the bed. Assessed the patient, Vitals taken and are stable, Pt did complain of some soreness on knees, but moves everything good. No abrasions/bruises. Patient said 'am having anxiety and I need something to calm down. Notified MD. Safely huddle done at bed side.

## 2012-05-05 NOTE — Evaluation (Signed)
Occupational Therapy Evaluation Patient Details Name: April [name] MRN: [id number] DOB: 1965-12-24 Today's Date: 05/05/2012 Time: 4098-1191 OT Time Calculation (min): 27 min  OT Assessment / Plan / Recommendation Clinical Impression  Pt presents to OT with decreased strength and coordination LUE. Pt will benefit from skilled OT to increase I with ADL activity and return to PLOF    OT Assessment  Patient needs continued OT Services    Follow Up Recommendations  Outpatient OT       Equipment Recommendations  None recommended by OT       Frequency  Min 3X/week           ADL  Eating/Feeding: Independent Where Assessed - Eating/Feeding: Edge of bed Grooming: Performed;Wash/dry hands;Independent Where Assessed - Grooming: Unsupported standing Upper Body Bathing: Performed;Independent Where Assessed - Upper Body Bathing: Unsupported sitting Lower Body Bathing: Performed;Minimal assistance (needed increased time to don left sock) Where Assessed - Lower Body Bathing: Unsupported sit to stand Upper Body Dressing: Simulated;Independent Where Assessed - Upper Body Dressing: Unsupported sitting Lower Body Dressing: Performed;Minimal assistance (increased time to don left sock) Where Assessed - Lower Body Dressing: Unsupported sit to stand Toilet Transfer: Performed;Independent Toilet Transfer Method: Sit to Barista: Regular height toilet Toileting - Clothing Manipulation and Hygiene: Performed;Independent Where Assessed - Toileting Clothing Manipulation and Hygiene: Standing ADL Comments: Pt does present with mild decreased strength and coordination L UE.  OT did issue squeeze ball and red theraputty for L hand.  Educated on activities to do with these.  Pt will also benefit from theraband for LUE, but pt too tired at this time    OT Diagnosis: Generalized weakness  OT Problem List: Decreased strength;Decreased coordination;Impaired UE functional use OT  Treatment Interventions: Neuromuscular education;Patient/family education;Therapeutic exercise   OT Goals Acute Rehab OT Goals OT Goal Formulation: With patient Arm Goals Pt Will Complete Theraband Exer: Independently;Bilateral upper extremities;10 reps;2 sets;Level 2 Theraband Arm Goal: Theraband Exercises - Progress: Goal set today Additional Arm Goal #1: Pt will peform exercise with theraputty for L hand to increase strength in preparation for increased  I with ADL activity I ly  Visit Information  Last OT Received On: 05/05/12       Prior Functioning     Home Living Lives With: Friend(s) Available Help at Discharge: Friend(s);Available 24 hours/day Type of Home: House Home Layout: One level Bathroom Shower/Tub: Engineer, manufacturing systems: Standard Bathroom Accessibility: Yes Home Adaptive Equipment: None Prior Function Level of Independence: Independent Able to Take Stairs?: Yes Driving: Yes Vocation: On disability Dominant Hand: Right            Cognition  Overall Cognitive Status: Appears within functional limits for tasks assessed/performed Arousal/Alertness: Awake/alert Orientation Level: Appears intact for tasks assessed Behavior During Session: [facility] for tasks performed    Extremity/Trunk Assessment Right Upper Extremity Assessment RUE ROM/Strength/Tone: Deficits RUE ROM/Strength/Tone Deficits: decreased strength and coordination RUE Sensation: WFL - Light Touch Left Upper Extremity Assessment LUE ROM/Strength/Tone: WFL for tasks assessed LUE Sensation: WFL - Light Touch Right Lower Extremity Assessment RLE ROM/Strength/Tone: WFL for tasks assessed Left Lower Extremity Assessment LLE ROM/Strength/Tone: WFL for tasks assessed     Mobility Bed Mobility Bed Mobility: Supine to Sit Transfers Transfers: Sit to Stand;Stand to Sit Sit to Stand: 7: Independent Stand to Sit: 7: Independent              End of Session OT - End of  Session Activity Tolerance: Patient tolerated treatment well Patient left:  in bed;with call bell/phone within reach  GO Functional Assessment Tool Used: clinical observation Functional Limitation: Self care Self Care Current Status [phone]): At least 1 percent but less than 20 percent impaired, limited or restricted Self Care Goal Status ([id number]): 0 percent impaired, limited or restricted   Reha Martinovich, [place]ung 05/05/2012, 9:39 AM

## 2012-05-05 NOTE — Progress Notes (Signed)
*  PRELIMINARY RESULTS* Vascular Ultrasound Carotid Duplex (Doppler) has been completed.  There is no obvious evidence of hemodynamically significant internal carotid artery stenosis >40%. Vertebral arteries are patent with antegrade flow.  05/05/2012 10:48 AM Gertie Fey, RDMS, RDCS

## 2012-05-05 NOTE — Evaluation (Deleted)
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 would benefit from acute PT to address activity tolerance with mobility so pt. may retrun to PLOF. Possible only 1x tx to address ambulation and stairs; stairs not tested today secondary to pt. feeling dizzy. Pt. with NIHSS=2. Pt. educated on stroke risk factors such as smoking, HTN, lack of exercise, and hypercholesterolemia; pt. undertsanding of risk factors.     PT Assessment  Patient needs continued PT services    Follow Up Recommendations  No PT follow up       Barriers to Discharge None      Equipment Recommendations  None recommended by PT       Frequency Min 3X/week    Precautions / Restrictions Precautions Precautions: None Restrictions Weight Bearing Restrictions: No   Pertinent Vitals/Pain No pain reported      Mobility  Bed Mobility Bed Mobility: Supine to Sit;Sitting - Scoot to Edge of Bed Supine to Sit: 5: Supervision;HOB elevated (20 degrees) Sitting - Scoot to Edge of Bed: 7: Independent Details for Bed Mobility Assistance: Pt. required supervision with supine -> sit for safety; pt. moving too quickly.  Transfers Sit to Stand: 7: Independent Stand to Sit: 7: Independent Ambulation/Gait Ambulation/Gait Assistance: 4: Min guard Ambulation Distance (Feet): 250 Feet Assistive device: None Ambulation/Gait Assistance Details: Pt. complained of feeling dizzy and needed to take seated rest break.  Gait Pattern: Within Functional Limits Gait velocity: decreased Stairs: No (Pt. complaining of dizziness) Modified Rankin (Stroke Patients Only) Pre-Morbid Rankin Score: No symptoms Modified Rankin: No significant disability            PT Diagnosis: Difficulty walking  PT Problem List: Decreased activity tolerance;Decreased mobility PT Treatment Interventions: DME instruction;Gait training;Stair training;Functional mobility training;Therapeutic activities;Therapeutic exercise;Patient/family education   PT Goals Acute Rehab PT Goals PT Goal Formulation: With patient Time For Goal Achievement: 05/12/12 Potential to Achieve Goals: Good Pt will Ambulate: >150 feet;with modified independence PT Goal: Ambulate - Progress: Goal set today Pt will Go Up / Down Stairs: 3-5 stairs;with modified independence PT Goal: Up/Down Stairs - Progress: Goal set today Pt will Perform Home Exercise Program: Independently PT Goal: Perform Home Exercise Program - Progress: Goal set today  Visit Information  Last PT Received On: 05/05/12 Assistance Needed: +1    Subjective Data  Subjective: "I've been able to walk fine." Patient Stated Goal: Return home   Prior Functioning  Home Living Lives With: Friend(s) Available Help at Discharge: Friend(s);Available 24 hours/day Type of Home: House Home Access: Stairs to enter [employer] of Steps: 3 Entrance Stairs-Rails: None Home Layout: One level Bathroom Shower/Tub: Network engineer: None Prior Function Level of Independence: Independent Able to Take Stairs?: Yes Driving: Yes Vocation: On disability Communication Communication: No difficulties Dominant Hand: Right    Cognition  Overall Cognitive Status: Appears within functional limits for tasks assessed/performed Arousal/Alertness: Awake/alert Orientation Level: Appears intact for tasks assessed Behavior During Session: [facility] for tasks performed    Extremity/Trunk Assessment Right Upper Extremity Assessment RUE ROM/Strength/Tone: [facility] for tasks assessed Left Upper Extremity Assessment LUE ROM/Strength/Tone: Deficits LUE ROM/Strength/Tone Deficits: Elbow flexion  3+; elbow extension 3+; grip decreased compared to R Right Lower Extremity Assessment RLE ROM/Strength/Tone: [facility] for tasks assessed (  4+ hip flexion, knee flexion, and knee extension) Left Lower Extremity Assessment LLE ROM/Strength/Tone: [facility] for tasks assessed (4+ hip flexion, knee flexion, and knee extension) Trunk Assessment Trunk Assessment: Normal      End of Session PT - End of Session Activity Tolerance: Patient tolerated treatment well Patient left: in chair;with call bell/phone within reach;with family/visitor present Nurse Communication: Mobility status    Army Chaco SPT 05/05/2012, 3:29 PM

## 2012-05-05 NOTE — Progress Notes (Signed)
Stroke Team Progress Note  HISTORY April [name] is an 46 y.o. female with a history of hypertension, bipolar disorder and schizoaffective disorder, presenting with weakness and numbness involving left upper extremity since 04/30/2012. Patient noticed onset of symptoms around noon of that day. She's had slight tingling involving left lower face as well. She's had no changes in speech. She has not noticed any left lower extremity weakness. Patient has not been on antiplatelet therapy. She has no previous history of stroke nor TIA. CT scan of her head showed right MCA territory lesion consistent with recent infarction, including mild signs of edema without mass effect. NIH stroke score was 2. She presented to the ED 05/04/2012. Patient was not a TPA candidate secondary to delay in arrival. She was admitted for further evaluation and treatment.  SUBJECTIVE Her brother is at the bedside.  Overall she feels her condition is gradually worsening. She just got back to her room from testing.  OBJECTIVE Most recent Vital Signs: Filed Vitals:   05/04/12 2355 05/05/12 0233 05/05/12 0555 05/05/12 1022  BP: 145/89 112/57 95/58 102/63  Pulse: 75 72 72 80  Temp: 98.2 F (36.8 C) 97.3 F (36.3 C) 97.8 F (36.6 C) 98 F (36.7 C)  TempSrc: Oral Oral Oral Oral  Resp: 20 20 20 18   SpO2: 100% 96% 97% 98%   CBG (last 3)  No results found for this basename: GLUCAP:3 in the last 72 hours  IV Fluid Intake:     . sodium chloride 75 mL/hr at 05/05/12 0941  . [DISCONTINUED] 0.45 % NaCl with KCl 20 mEq / L 1,000 mL (05/04/12 2315)    MEDICATIONS    . [COMPLETED] acetaminophen  650 mg Oral Once  . [COMPLETED] aspirin  324 mg Oral Once  . aspirin  325 mg Oral Daily  . atorvastatin  20 mg Oral q1800  . citalopram  30 mg Oral Daily  . clonazePAM  1 mg Oral Daily  . enoxaparin  40 mg Subcutaneous Q24H  . hydrOXYzine  50 mg Oral TID  . [COMPLETED] ketorolac      . lamoTRIgine  200 mg Oral BID  . OLANZapine  15  mg Oral QHS  . [COMPLETED] ondansetron  4 mg Intravenous Once  . thiothixene  2 mg Oral BID  . [DISCONTINUED] clonazePAM  1.5 mg Oral Daily  . [DISCONTINUED] cloNIDine  0.1 mg Oral BID  . [DISCONTINUED] ketorolac  15 mg Intravenous Q6H  . [DISCONTINUED] zolpidem  10 mg Oral QHS  . [DISCONTINUED] zolpidem  5 mg Oral QHS   PRN:  ketorolac, ondansetron, zolpidem  Diet:  General thin liquids Activity:   Bathroom privileges with assistance DVT Prophylaxis:  Lovenox 40 mg sq daily   CLINICALLY SIGNIFICANT STUDIES Basic Metabolic Panel:  Lab 05/04/12 4098 05/04/12 1756  NA -- 139  K -- 3.6  CL -- 104  CO2 -- --  GLUCOSE -- 99  BUN -- 5*  CREATININE 0.79 0.80  CALCIUM -- --  MG -- --  PHOS -- --   Liver Function Tests: No results found for this basename: AST:2,ALT:2,ALKPHOS:2,BILITOT:2,PROT:2,ALBUMIN:2 in the last 168 hours CBC:  Lab 05/04/12 2137 05/04/12 1756 05/04/12 1733  WBC 9.0 -- 8.1  NEUTROABS -- -- 4.1  HGB 13.1 14.3 --  HCT 39.3 42.0 --  MCV 89.1 -- 89.5  PLT 311 -- 297   Coagulation:  Lab 05/04/12 1733  LABPROT 12.1  INR 0.90   Cardiac Enzymes: No results found for this basename: CKTOTAL:3,CKMB:3,CKMBINDEX:3,TROPONINI:3  in the last 168 hours Urinalysis: No results found for this basename: COLORURINE:2,APPERANCEUR:2,LABSPEC:2,PHURINE:2,GLUCOSEU:2,HGBUR:2,BILIRUBINUR:2,KETONESUR:2,PROTEINUR:2,UROBILINOGEN:2,NITRITE:2,LEUKOCYTESUR:2 in the last 168 hours Lipid Panel    Component Value Date/Time   CHOL 232* 05/05/2012 0550   TRIG 274* 05/05/2012 0550   HDL 27* 05/05/2012 0550   CHOLHDL 8.6 05/05/2012 0550   VLDL 55* 05/05/2012 0550   LDLCALC 150* 05/05/2012 0550   HgbA1C  Lab Results  Component Value Date   HGBA1C 6.3* 05/04/2012    Urine Drug Screen:     Component Value Date/Time   LABOPIA NONE DETECTED 05/04/2012 1906   LABOPIA NEGATIVE 06/08/2007 2320   COCAINSCRNUR NONE DETECTED 05/04/2012 1906   COCAINSCRNUR NEGATIVE 06/08/2007 2320   LABBENZ NONE DETECTED  05/04/2012 1906   LABBENZ  Value: POSITIVE (NOTE) Result repeated and verified. Sent for confirmatory testing* 06/08/2007 2320   AMPHETMU NONE DETECTED 05/04/2012 1906   AMPHETMU NEGATIVE 06/08/2007 2320   THCU POSITIVE* 05/04/2012 1906   LABBARB NONE DETECTED 05/04/2012 1906    Alcohol Level: No results found for this basename: ETH:2 in the last 168 hours  CT of the brain   1.  Interval right MCA territory infarct involving the insular cortex and inferior right frontal operculum.  This is likely acute. 2.  There is some local mass effect without significant midline shift. 3.  Scattered subcortical white matter hypoattenuation bilaterally likely reflects the sequelae of chronic microvascular ischemia.   MRI of the brain  Acute non hemorrhagic right hemispheric infarcts involving portions of the right opercular region, right sub insular region, right frontal lobe and right parietal lobe. Mild mass effect with slight compression of the right lateral ventricle without midline shift.  MRA of the brain  Markedly abnormal motion degraded MR angiogram of the circle Willis.  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  Small caliber left internal carotid artery suggesting proximal stenosis with occlusion or high-grade stenosis of the left internal carotid artery after takeoff of left ophthalmic artery.  Significant increased vasculature as seen on source imaging may represent result of collateral flow although vascular malformation is not excluded and catheter angiogram of the neck and intracranial vasculature may be considered.   2D Echocardiogram  EF 60-65% with no source of embolus.  Carotid Doppler  No evidence of hemodynamically significant internal carotid artery stenosis. Vertebral artery flow is antegrade.   CXR    EKG  sinus tachycardia.   Therapy Recommendations PT - none; OT - none  Physical Exam   Pleasant young Caucasian lady currently not in  distress.Awake alert. Afebrile. Head is nontraumatic. Neck is supple without bruit. Hearing is normal. Cardiac exam no murmur or gallop. Lungs are clear to auscultation. Distal pulses are well felt.  Neurological exam : Awake  Alert oriented x 3. Normal speech and language.eye movements full without nystagmus.fundi were not visualized. Vision acuity and fields appear normal Face symmetric. Tongue midline. Normal strength, tone, reflexes and coordination. Normal sensation. Gait deferred.  ASSESSMENT April [name] is a 46 y.o. female presenting with numbness in her left arm with weakness. 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 Infarct felt to be likely embolic secondary to unknown etiology.  Work up underway. On no antiplatelets prior to admission. Now on aspirin 325 mg orally every day for secondary stroke prevention. Patient with resultant mild left arm numbness   Migraines Hyperlipidemia, LDL 150, not on statin PTA, now on lipitor, goal LDL < 100  HgbA1c 6.3 Cigarette smoker Hypertension  Hospital day # 1  TREATMENT/PLAN  Continue aspirin. Add clopidogrel 75 mg orally every day for secondary stroke prevention x 3 mos then aspirin alone. TEE to look for embolic source. Arranged with [facility]. Will need to be NPO after midnight. If positive for PFO (patent foramen ovale), check bilateral lower extremity venous dopplers to rule out DVT as possible source of stroke.  Hypercoagulable panel pending. Will add vasculitic labs (C3, C4, CH50, ESR, ANA) HIV & RPR If TEE unrevealing, please schedule outpatient telemetry monitoring to assess patient for atrial fibrillation as source of stroke. May be arranged with patient's cardiologist, or cardiologist of choice.  If TEE unrevealing, please schedule an outpatient TCD bubble study with emboli monitoring with Dr. Pearlean Brownie in 1 month to further evaluate for possible PFO. Have patient  call for appointment.   Annie Main, MSN, RN, ANVP-BC, ANP-BC, GNP-BC [facility] Pager: [phone] 05/05/2012 11:41 AM   I have personally examined this patient,reviewed pertinent data and developed plan of care. I agree with above

## 2012-05-05 NOTE — ED Provider Notes (Signed)
Medical screening examination/treatment/procedure(s) were conducted as a shared visit with non-physician practitioner(s) and myself.  I personally evaluated the patient during the encounter  Doug Sou, MD 05/05/12 [phone]

## 2012-05-05 NOTE — Progress Notes (Signed)
  Echocardiogram 2D Echocardiogram has been performed.  Cathie Beams 05/05/2012, 10:52 AM

## 2012-05-05 NOTE — Evaluation (Signed)
Seen and agree with SPT note Kalep Full Tabor Jazae Gandolfi, PT [phone]  

## 2012-05-06 ENCOUNTER — Ambulatory Visit (HOSPITAL_COMMUNITY): Admission: RE | Admit: 2012-05-06 | Payer: Medicaid Other | Source: Ambulatory Visit | Admitting: Cardiology

## 2012-05-06 ENCOUNTER — Encounter (HOSPITAL_COMMUNITY): Payer: Self-pay | Admitting: Internal Medicine

## 2012-05-06 ENCOUNTER — Encounter (HOSPITAL_COMMUNITY)
Admission: EM | Disposition: A | Discharge status: Home Health | Payer: Self-pay | Source: Home / Self Care | Attending: Internal Medicine

## 2012-05-06 DIAGNOSIS — E785 Hyperlipidemia, unspecified: Secondary | ICD-10-CM

## 2012-05-06 DIAGNOSIS — R443 Hallucinations, unspecified: Secondary | ICD-10-CM | POA: Diagnosis present

## 2012-05-06 DIAGNOSIS — F329 Major depressive disorder, single episode, unspecified: Secondary | ICD-10-CM | POA: Diagnosis present

## 2012-05-06 DIAGNOSIS — Z720 Tobacco use: Secondary | ICD-10-CM

## 2012-05-06 DIAGNOSIS — F32A Depression, unspecified: Secondary | ICD-10-CM | POA: Diagnosis present

## 2012-05-06 DIAGNOSIS — I6789 Other cerebrovascular disease: Secondary | ICD-10-CM

## 2012-05-06 HISTORY — DX: Hyperlipidemia, unspecified: E78.5

## 2012-05-06 HISTORY — DX: Depression, unspecified: F32.A

## 2012-05-06 HISTORY — PX: TEE WITHOUT CARDIOVERSION: SHX5443

## 2012-05-06 HISTORY — DX: Tobacco use: Z72.0

## 2012-05-06 LAB — ANA: Anti Nuclear Antibody(ANA): NEGATIVE

## 2012-05-06 LAB — LUPUS ANTICOAGULANT PANEL
DRVVT: 38 secs (ref <42.9)
PTT Lupus Anticoagulant: 42.2 secs (ref 28.0–43.0)

## 2012-05-06 LAB — RAPID URINE DRUG SCREEN, HOSP PERFORMED
Amphetamines: NOT DETECTED
Benzodiazepines: NOT DETECTED
Tetrahydrocannabinol: POSITIVE — AB

## 2012-05-06 LAB — URINALYSIS, ROUTINE W REFLEX MICROSCOPIC
Bilirubin Urine: NEGATIVE
Glucose, UA: NEGATIVE mg/dL
Hgb urine dipstick: NEGATIVE
Protein, ur: NEGATIVE mg/dL
Specific Gravity, Urine: 1.009 (ref 1.005–1.030)
Urobilinogen, UA: 0.2 mg/dL (ref 0.0–1.0)

## 2012-05-06 LAB — C3 COMPLEMENT: C3 Complement: 157 mg/dL (ref 90–180)

## 2012-05-06 LAB — C4 COMPLEMENT: Complement C4, Body Fluid: 28 mg/dL (ref 10–40)

## 2012-05-06 SURGERY — ECHOCARDIOGRAM, TRANSESOPHAGEAL
Anesthesia: Moderate Sedation

## 2012-05-06 MED ORDER — CLOPIDOGREL BISULFATE 75 MG PO TABS
75.0000 mg | ORAL_TABLET | Freq: Every day | ORAL | Status: DC
  Administered 2012-05-07 – 2012-05-14 (×8): 75 mg via ORAL
  Filled 2012-05-06 (×11): qty 1

## 2012-05-06 MED ORDER — MIDAZOLAM HCL 5 MG/ML IJ SOLN
INTRAMUSCULAR | Status: AC
  Filled 2012-05-06: qty 2

## 2012-05-06 MED ORDER — OXYCODONE HCL 5 MG PO TABS
5.0000 mg | ORAL_TABLET | ORAL | Status: DC | PRN
  Administered 2012-05-06 – 2012-05-14 (×27): 5 mg via ORAL
  Filled 2012-05-06 (×28): qty 1

## 2012-05-06 MED ORDER — FENTANYL CITRATE 0.05 MG/ML IJ SOLN
INTRAMUSCULAR | Status: AC
  Filled 2012-05-06: qty 2

## 2012-05-06 MED ORDER — ASPIRIN 81 MG PO CHEW
81.0000 mg | CHEWABLE_TABLET | Freq: Every day | ORAL | Status: DC
  Administered 2012-05-06 – 2012-05-14 (×9): 81 mg via ORAL
  Filled 2012-05-06 (×10): qty 1

## 2012-05-06 MED ORDER — SODIUM CHLORIDE 0.9 % IV SOLN
INTRAVENOUS | Status: DC
  Administered 2012-05-06: 18:00:00 via INTRAVENOUS

## 2012-05-06 MED ORDER — FENTANYL CITRATE 0.05 MG/ML IJ SOLN
INTRAMUSCULAR | Status: DC | PRN
  Administered 2012-05-06: 50 ug via INTRAVENOUS
  Administered 2012-05-06 (×2): 25 ug via INTRAVENOUS

## 2012-05-06 MED ORDER — PROMETHAZINE HCL 25 MG/ML IJ SOLN
12.5000 mg | Freq: Four times a day (QID) | INTRAMUSCULAR | Status: DC | PRN
  Administered 2012-05-07 – 2012-05-10 (×4): 12.5 mg via INTRAVENOUS
  Filled 2012-05-06 (×5): qty 1

## 2012-05-06 MED ORDER — MIDAZOLAM HCL 10 MG/2ML IJ SOLN
INTRAMUSCULAR | Status: DC | PRN
  Administered 2012-05-06: 2 mg via INTRAVENOUS
  Administered 2012-05-06: 1 mg via INTRAVENOUS
  Administered 2012-05-06: 2 mg via INTRAVENOUS
  Administered 2012-05-06: 1 mg via INTRAVENOUS
  Administered 2012-05-06: 2 mg via INTRAVENOUS

## 2012-05-06 NOTE — Progress Notes (Signed)
Stroke Team Progress Note  HISTORY April [name] is an 46 y.o. female with a history of hypertension, bipolar disorder and schizoaffective disorder, presenting with weakness and numbness involving left upper extremity since 04/30/2012. Patient noticed onset of symptoms around noon of that day. She's had slight tingling involving left lower face as well. She's had no changes in speech. She has not noticed any left lower extremity weakness. Patient has not been on antiplatelet therapy. She has no previous history of stroke nor TIA. CT scan of her head showed right MCA territory lesion consistent with recent infarction, including mild signs of edema without mass effect. NIH stroke score was 2. She presented to the ED 05/04/2012. Patient was not a TPA candidate secondary to delay in arrival. She was admitted for further evaluation and treatment.  SUBJECTIVE Family at bedside.  OBJECTIVE Most recent Vital Signs: Filed Vitals:   05/05/12 2158 05/06/12 0206 05/06/12 0553 05/06/12 1042  BP: 125/65 100/54 149/81 127/79  Pulse: 91 78 79 101  Temp: 98.3 F (36.8 C) 98 F (36.7 C) 98.2 F (36.8 C) 98.1 F (36.7 C)  TempSrc: Oral Oral Oral Oral  Resp: 20 20 20 19   Height:      Weight:      SpO2: 98% 96% 100% 100%   CBG (last 3)  No results found for this basename: GLUCAP:3 in the last 72 hours  IV Fluid Intake:     . [EXPIRED] sodium chloride 75 mL/hr at 05/05/12 0941   MEDICATIONS    . aspirin  325 mg Oral Daily  . atorvastatin  20 mg Oral q1800  . citalopram  30 mg Oral Daily  . clonazePAM  1 mg Oral Daily  . enoxaparin  40 mg Subcutaneous Q24H  . hydrOXYzine  50 mg Oral TID  . lamoTRIgine  200 mg Oral BID  . [COMPLETED] LORazepam  2 mg Oral Once  . nicotine  21 mg Transdermal Daily  . OLANZapine  15 mg Oral QHS  . thiothixene  2 mg Oral BID   PRN:  ketorolac, ondansetron, zolpidem  Diet:  NPO  Activity:   Bathroom privileges with assistance DVT Prophylaxis:  Lovenox 40 mg sq daily    CLINICALLY SIGNIFICANT STUDIES Basic Metabolic Panel:   Lab 05/04/12 2137 05/04/12 1756  NA -- 139  K -- 3.6  CL -- 104  CO2 -- --  GLUCOSE -- 99  BUN -- 5*  CREATININE 0.79 0.80  CALCIUM -- --  MG -- --  PHOS -- --   Liver Function Tests: No results found for this basename: AST:2,ALT:2,ALKPHOS:2,BILITOT:2,PROT:2,ALBUMIN:2 in the last 168 hours CBC:   Lab 05/04/12 2137 05/04/12 1756 05/04/12 1733  WBC 9.0 -- 8.1  NEUTROABS -- -- 4.1  HGB 13.1 14.3 --  HCT 39.3 42.0 --  MCV 89.1 -- 89.5  PLT 311 -- 297   Coagulation:   Lab 05/04/12 1733  LABPROT 12.1  INR 0.90   Cardiac Enzymes: No results found for this basename: CKTOTAL:3,CKMB:3,CKMBINDEX:3,TROPONINI:3 in the last 168 hours Urinalysis: No results found for this basename: COLORURINE:2,APPERANCEUR:2,LABSPEC:2,PHURINE:2,GLUCOSEU:2,HGBUR:2,BILIRUBINUR:2,KETONESUR:2,PROTEINUR:2,UROBILINOGEN:2,NITRITE:2,LEUKOCYTESUR:2 in the last 168 hours Lipid Panel    Component Value Date/Time   CHOL 232* 05/05/2012 0550   TRIG 274* 05/05/2012 0550   HDL 27* 05/05/2012 0550   CHOLHDL 8.6 05/05/2012 0550   VLDL 55* 05/05/2012 0550   LDLCALC 150* 05/05/2012 0550   HgbA1C  Lab Results  Component Value Date   HGBA1C 6.3* 05/04/2012    Urine Drug Screen:  Component Value Date/Time   LABOPIA NONE DETECTED 05/06/2012 0619   LABOPIA NEGATIVE 06/08/2007 2320   COCAINSCRNUR NONE DETECTED 05/06/2012 0619   COCAINSCRNUR NEGATIVE 06/08/2007 2320   LABBENZ NONE DETECTED 05/06/2012 0619   LABBENZ  Value: POSITIVE (NOTE) Result repeated and verified. Sent for confirmatory testing* 06/08/2007 2320   AMPHETMU NONE DETECTED 05/06/2012 0619   AMPHETMU NEGATIVE 06/08/2007 2320   THCU POSITIVE* 05/06/2012 0619   LABBARB NONE DETECTED 05/06/2012 0619    Alcohol Level: No results found for this basename: ETH:2 in the last 168 hours  CT of the brain   1.  Interval right MCA territory infarct involving the insular cortex and inferior right frontal operculum.   This is likely acute. 2.  There is some local mass effect without significant midline shift. 3.  Scattered subcortical white matter hypoattenuation bilaterally likely reflects the sequelae of chronic microvascular ischemia.   MRI of the brain  Acute non hemorrhagic right hemispheric infarcts involving portions of the right opercular region, right sub insular region, right frontal lobe and right parietal lobe. Mild mass effect with slight compression of the right lateral ventricle without midline shift.  MRA of the brain  Markedly abnormal motion degraded MR angiogram of the circle Willis.  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  Small caliber left internal carotid artery suggesting proximal stenosis with occlusion or high-grade stenosis of the left internal carotid artery after takeoff of left ophthalmic artery.  Significant increased vasculature as seen on source imaging may represent result of collateral flow although vascular malformation is not excluded and catheter angiogram of the neck and intracranial vasculature may be considered.   2D Echocardiogram  EF 60-65% with no source of embolus.  Carotid Doppler  No evidence of hemodynamically significant internal carotid artery stenosis. Vertebral artery flow is antegrade.   CXR    EKG  sinus tachycardia.   Therapy Recommendations PT - none; OT - none  Physical Exam   Pleasant young Caucasian lady currently not in distress.Awake alert. Afebrile. Head is nontraumatic. Neck is supple without bruit. Hearing is normal. Cardiac exam no murmur or gallop. Lungs are clear to auscultation. Distal pulses are well felt.  Neurological exam : Awake  Alert oriented x 3. Normal speech and language.eye movements full without nystagmus.fundi were not visualized. Vision acuity and fields appear normal Face symmetric. Tongue midline. Normal strength, tone, reflexes and coordination. Normal sensation. Gait  deferred.   ASSESSMENT April [name] is a 46 y.o. female presenting with numbness in her left arm with weakness. 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 Infarct felt to be likely embolic secondary to unknown etiology.  Work up underway. On no antiplatelets prior to admission. Now on aspirin 325 mg orally every day for secondary stroke prevention. Patient with resultant mild left arm numbness   Migraines Hyperlipidemia, LDL 150, not on statin PTA, now on lipitor, goal LDL < 100 HgbA1c 6.3 Cigarette smoker Hypertension Hypercoagulable panel negative thus far, complete results pending  Hospital day # 2  TREATMENT/PLAN  Continue aspirin 81 mg and  clopidogrel 75 mg orally every day for secondary stroke prevention x 3 mos then aspirin alone. TEE to today look for embolic source. Arranged with [facility].  If positive for PFO (patent foramen ovale), check bilateral lower extremity venous dopplers to rule out DVT as possible source of stroke.  F/u Hypercoagulable panel  If TEE unrevealing, please  schedule outpatient telemetry monitoring to assess patient for atrial fibrillation as source of stroke. May be arranged with patient's cardiologist, or cardiologist of choice.  If TEE unrevealing, please schedule an outpatient TCD bubble study with emboli monitoring with Dr. Pearlean Brownie in 1 month to further evaluate for possible PFO. Have patient call for appointment.  Ok for discharge after TEE from neuro standpoint. Stroke Service will sign off. Follow up with Dr. Pearlean Brownie, Stroke Clinic, in 2 months.  Annie Main, MSN, RN, ANVP-BC, ANP-BC, [facility] Pager: [phone] 05/06/2012 11:46 AM   I have personally examined this patient,reviewed pertinent data and developed plan of care. I agree with above   Delia Heady, MD Medical Director [facility] Pager: [phone] 05/06/2012 8:11 PM

## 2012-05-06 NOTE — H&P (View-Only) (Signed)
[facility]                                                                                Patient Demographics  April [name], is a 46 y.o. female  VOZ:[id number]  [id number]  DOB - Sep 26, 1965  Admit date - 05/04/2012  Admitting Physician Gery Pray, MD  Outpatient Primary MD for the patient is No primary provider on file.  LOS - 1   Chief Complaint  Patient presents with  . Weakness        Assessment & Plan    1.CVA - with a left-sided deficits, MRA brain noted, discussed with neurologist Dr. Pearlean Brownie, at this point he does not recommend any surgical intervention or consult, we will continue to monitor her on tele, obtain PT, OT, speech and put, A1c and lipid panel, MRI MRA brain, echogram, carotid duplex, neurology input. Antiplatelet therapy per neurology for now is aspirin, LDL is over 100 placed on statin of note she is on Zocor at home which is missing in her home medications.   No results found for this basename: HGBA1C    Lab Results  Component Value Date   CHOL 232* 05/05/2012   HDL 27* 05/05/2012   LDLCALC 150* 05/05/2012   TRIG 274* 05/05/2012   CHOLHDL 8.6 05/05/2012    2. History of tobacco abuse counseled to quit smoking.    3. Hypertension. Currently hypotensive, will discontinue her home dose clonidine, gentle normal saline and monitor. Have minimize her benzos.     4. History of schizoaffective disorder and bipolar disorder. Home medications to be continued, no acute issues.     Code Status: Home  Family Communication: He should in her brother  Disposition Plan: home    Procedures MRI MRA brain, echogram, carotid duplex   Consults  neurology   Time Spent in minutes   35   Antibiotics    Anti-infectives    None      Scheduled Meds:   . [COMPLETED] acetaminophen  650 mg Oral Once  . [COMPLETED] aspirin  324 mg Oral Once  . aspirin  325 mg Oral Daily  . atorvastatin  20 mg Oral q1800  . citalopram  30  mg Oral Daily  . clonazePAM  1 mg Oral Daily  . enoxaparin  40 mg Subcutaneous Q24H  . hydrOXYzine  50 mg Oral TID  . [COMPLETED] ketorolac      . lamoTRIgine  200 mg Oral BID  . OLANZapine  15 mg Oral QHS  . [COMPLETED] ondansetron  4 mg Intravenous Once  . thiothixene  2 mg Oral BID  . [DISCONTINUED] clonazePAM  1.5 mg Oral Daily  . [DISCONTINUED] cloNIDine  0.1 mg Oral BID  . [DISCONTINUED] ketorolac  15 mg Intravenous Q6H  . [DISCONTINUED] zolpidem  10 mg Oral QHS  . [DISCONTINUED] zolpidem  5 mg Oral QHS   Continuous Infusions:   . sodium chloride    . [DISCONTINUED] 0.45 % NaCl with KCl 20 mEq / L 1,000 mL (05/04/12 2315)   PRN Meds:.ketorolac, ondansetron, zolpidem   DVT Prophylaxis  Lovenox   Lab Results  Component Value Date   PLT 311 05/04/2012  Leroy Sea M.D on 05/05/2012 at 9:17 AM  Between 7am to 7pm - Pager - [phone]  After 7pm go to [email or web address] - password [email or web address]  And look for the night coverage person covering for me after hours  [facility] Office  [phone]    Subjective:   [name] today has, No headache, No chest pain, No abdominal pain - No Nausea, No new weakness tingling or numbness, No Cough - SOB. l side is weak.  Objective:   Filed Vitals:   05/04/12 2125 05/04/12 2355 05/05/12 0233 05/05/12 0555  BP: 145/89 145/89 112/57 95/58  Pulse: 75 75 72 72  Temp: 98.2 F (36.8 C) 98.2 F (36.8 C) 97.3 F (36.3 C) 97.8 F (36.6 C)  TempSrc: Oral Oral Oral Oral  Resp: 20 20 20 20   SpO2: 100% 100% 96% 97%    Wt Readings from Last 3 Encounters:  No data found for Wt    No intake or output data in the 24 hours ending 05/05/12 0917  Exam Awake Alert, Oriented X 3, No new F.N deficits, l sided strength 4/5 arm weaker than leg, Normal affect [name],PERRAL Supple Neck,No JVD, No cervical lymphadenopathy appriciated.  Symmetrical Chest wall movement, Good air movement bilaterally, CTAB RRR,No Gallops,Rubs  or new Murmurs, No Parasternal Heave +ve B.Sounds, Abd Soft, Non tender, No organomegaly appriciated, No rebound - guarding or rigidity. No Cyanosis, Clubbing or edema, No new Rash or bruise    Data Review   Micro Results No results found for this or any previous visit (from the past 240 hour(s)).  Radiology Reports Ct Head Wo Contrast  05/04/2012  *RADIOLOGY REPORT*  Clinical Data: Weakness.  Unable views left arm.  CT HEAD WITHOUT CONTRAST  Technique:  Contiguous axial images were obtained from the base of the skull through the vertex without contrast.  Comparison: CT head without contrast 08/09/2007.  Findings: Focal hypoattenuation involves the anterior insular cortex and frontal operculum.  There is no associated hemorrhage. There is focal sulcal effacement.  No other focal cortical infarct is evident.  Scattered subcortical white matter hypoattenuation is present bilaterally.  The ventricles are of normal size.  No significant extra-axial fluid collection is present.  The paranasal sinuses and mastoid air cells are clear.  The osseous skull is intact.  IMPRESSION:  1.  Interval right MCA territory infarct involving the insular cortex and inferior right frontal operculum.  This is likely acute. 2.  There is some local mass effect without significant midline shift. 3.  Scattered subcortical white matter hypoattenuation bilaterally likely reflects the sequelae of chronic microvascular ischemia.  These results were called by telephone on 05/04/2012 at 03:45 p.m. to Dr. Ranae Palms, who verbally acknowledged these results.   Original Report Authenticated By: Marin Roberts, M.D.    Mri Brain Without Contrast  05/05/2012  *RADIOLOGY REPORT*  Clinical Data:  Left arm weakness.  Hypertension.  MRI BRAIN WITHOUT CONTRAST MRA HEAD WITHOUT CONTRAST  Technique: Multiplanar, multiecho pulse sequences of the brain and surrounding structures were obtained according to standard protocol without intravenous  contrast.  Angiographic images of the head were obtained using MRA technique without contrast.  Comparison: 05/04/2012 head CT.  No comparison brain MR.  MRI HEAD  Findings:  Motion degraded exam.  Acute non hemorrhagic right hemispheric infarcts involving portions of the right opercular region, right sub insular region, right frontal lobe and right parietal lobe. Mild mass effect with slight compression of the right lateral ventricle without midline shift.  No  intracranial hemorrhage.  No intracranial mass lesion detected on this unenhanced exam.  No hydrocephalus.  Partially empty sella.  Minimal asymmetry of the hippocampi.  IMPRESSION: Acute total right hemispheric infarct as noted above.  MRA HEAD  Findings: Motion degraded exam.  Abrupt cut off left flow at the right carotid terminus with very few right middle cerebral artery branch vessels visualized.  Poor delineation of a majority of the anterior cerebral arteries bilaterally.  Small caliber left internal carotid arteries suggesting proximal stenosis with occlusion or significant narrowing of the left internal carotid artery after the takeoff of the left ophthalmic artery.  Poor delineation of a majority of the left middle cerebral artery branches which appear significantly narrowed irregular.  On source sequence, increased number of vessels in the suprasellar cistern and ambient cistern.  Increased vasculature right occipital lobe.  On FLAIR imaging, altered flow is seen within intracranial vasculature.  These findings may represent result of slow flow from prominent intracranial atherosclerotic type changes and subsequent collateral flow.  Vascular malformation not excluded.  The patient would benefit from catheter angiography.  Right vertebral artery is dominant.  Irregularity of both vertebral arteries with mild narrowing.  Poor delineation of the PICAs.  Small caliber basilar artery without high-grade stenosis.  Markedly irregular appearance of the  superior cerebral arteries and posterior cerebral arteries with poor delineation of the majority of the left posterior cerebral artery.  High-grade stenosis proximal right posterior cerebral artery.  Limited for detection of aneurysm.  IMPRESSION: Markedly abnormal motion degraded MR angiogram of the circle Willis.  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  Small caliber left internal carotid artery suggesting proximal stenosis with occlusion or high-grade stenosis of the left internal carotid artery after takeoff of left ophthalmic artery.  Significant increased vasculature as seen on source imaging may represent result of collateral flow although vascular malformation is not excluded and catheter angiogram of the neck and intracranial vasculature may be considered.  This has been made a PRA call report utilizing dashboard call feature.   Original Report Authenticated By: Lacy Duverney, M.D.    Mr Mra Head/brain Wo Cm  05/05/2012  *RADIOLOGY REPORT*  Clinical Data:  Left arm weakness.  Hypertension.  MRI BRAIN WITHOUT CONTRAST MRA HEAD WITHOUT CONTRAST  Technique: Multiplanar, multiecho pulse sequences of the brain and surrounding structures were obtained according to standard protocol without intravenous contrast.  Angiographic images of the head were obtained using MRA technique without contrast.  Comparison: 05/04/2012 head CT.  No comparison brain MR.  MRI HEAD  Findings:  Motion degraded exam.  Acute non hemorrhagic right hemispheric infarcts involving portions of the right opercular region, right sub insular region, right frontal lobe and right parietal lobe. Mild mass effect with slight compression of the right lateral ventricle without midline shift.  No intracranial hemorrhage.  No intracranial mass lesion detected on this unenhanced exam.  No hydrocephalus.  Partially empty sella.  Minimal asymmetry of the hippocampi.  IMPRESSION: Acute total right  hemispheric infarct as noted above.  MRA HEAD  Findings: Motion degraded exam.  Abrupt cut off left flow at the right carotid terminus with very few right middle cerebral artery branch vessels visualized.  Poor delineation of a majority of the anterior cerebral arteries bilaterally.  Small caliber left internal carotid arteries suggesting proximal stenosis with occlusion or significant narrowing of the left internal carotid artery after the takeoff of the left ophthalmic artery.  Poor delineation of  a majority of the left middle cerebral artery branches which appear significantly narrowed irregular.  On source sequence, increased number of vessels in the suprasellar cistern and ambient cistern.  Increased vasculature right occipital lobe.  On FLAIR imaging, altered flow is seen within intracranial vasculature.  These findings may represent result of slow flow from prominent intracranial atherosclerotic type changes and subsequent collateral flow.  Vascular malformation not excluded.  The patient would benefit from catheter angiography.  Right vertebral artery is dominant.  Irregularity of both vertebral arteries with mild narrowing.  Poor delineation of the PICAs.  Small caliber basilar artery without high-grade stenosis.  Markedly irregular appearance of the superior cerebral arteries and posterior cerebral arteries with poor delineation of the majority of the left posterior cerebral artery.  High-grade stenosis proximal right posterior cerebral artery.  Limited for detection of aneurysm.  IMPRESSION: Markedly abnormal motion degraded MR angiogram of the circle Willis.  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  Small caliber left internal carotid artery suggesting proximal stenosis with occlusion or high-grade stenosis of the left internal carotid artery after takeoff of left ophthalmic artery.  Significant increased vasculature as seen on source imaging may  represent result of collateral flow although vascular malformation is not excluded and catheter angiogram of the neck and intracranial vasculature may be considered.  This has been made a PRA call report utilizing dashboard call feature.   Original Report Authenticated By: Lacy Duverney, M.D.     CBC  Lab 05/04/12 2137 05/04/12 1756 05/04/12 1733  WBC 9.0 -- 8.1  HGB 13.1 14.3 13.0  HCT 39.3 42.0 39.4  PLT 311 -- 297  MCV 89.1 -- 89.5  MCH 29.7 -- 29.5  MCHC 33.3 -- 33.0  RDW 13.6 -- 13.6  LYMPHSABS -- -- 3.4  MONOABS -- -- 0.5  EOSABS -- -- 0.0  BASOSABS -- -- 0.1  BANDABS -- -- --    Chemistries   Lab 05/04/12 2137 05/04/12 1756  NA -- 139  K -- 3.6  CL -- 104  CO2 -- --  GLUCOSE -- 99  BUN -- 5*  CREATININE 0.79 0.80  CALCIUM -- --  MG -- --  AST -- --  ALT -- --  ALKPHOS -- --  BILITOT -- --   ------------------------------------------------------------------------------------------------------------------ CrCl is unknown because there is no height on file for the current visit. ------------------------------------------------------------------------------------------------------------------ No results found for this basename: HGBA1C:2 in the last 72 hours ------------------------------------------------------------------------------------------------------------------  [facility] 05/05/12 0550  CHOL 232*  HDL 27*  LDLCALC 150*  TRIG 274*  CHOLHDL 8.6  LDLDIRECT --   ------------------------------------------------------------------------------------------------------------------ No results found for this basename: TSH,T4TOTAL,FREET3,T3FREE,THYROIDAB in the last 72 hours ------------------------------------------------------------------------------------------------------------------ No results found for this basename: VITAMINB12:2,FOLATE:2,FERRITIN:2,TIBC:2,IRON:2,RETICCTPCT:2 in the last 72 hours  Coagulation profile  Lab 05/04/12 1733  INR 0.90   PROTIME --    No results found for this basename: DDIMER:2 in the last 72 hours  Cardiac Enzymes No results found for this basename: CK:3,CKMB:3,TROPONINI:3,MYOGLOBIN:3 in the last 168 hours ------------------------------------------------------------------------------------------------------------------ No components found with this basename: POCBNP:3

## 2012-05-06 NOTE — Progress Notes (Signed)
[facility] PROGRESS NOTE  April [name] RUE:[id number] DOB: 1966/05/04 DOA: 05/04/2012 PCP: No primary provider on file.  Assessment/Plan: #1 acute nonhemorrhagic right hemispheric infarcts involving portions of the right opercular region, right subinsular region, right frontal lobe, right parietal lobe, Likely embolic in nature. Carotid Dopplers negative for ICA stenosis. 2-D echo is negative for any source of emboli. TEE is pending. Continue aspirin and Plavix x30 days per neurology and then full dose aspirin. Continue Lipitor. Risk factor modification. Tobacco cessation. PT OT. Neurology following and appreciate input and recommendations.  #2 auditory and visual hallucinations Patient does have a history of schizoaffective disorder and bipolar disorder. Patient is on Zyprexa, Lamictal, NAVANE, Celexa. Patient states is having auditory and visual hallucinations. Will consult with psychiatry for further evaluation and management.  #3 hyperlipidemia Lipitor.  #4 schizoaffective disorder/bipolar disorder Patient is having auditory and visual hallucinations. Will continue his Zyprexa, Celexa, Lamictal ,navane for now. [facility] consult psychiatry for further evaluation and management.  #5 tobacco abuse Nicotine patch. Tobacco cessation.  Code Status: Full Family Communication: Updated patient, son and sister at bedside Disposition Plan: Home when medically stable.   Consultants:  Neurology: Dr. Roseanne Reno 05/04/2012  Procedures:  CT head 05/04/2012  MRI head 05/04/2012  2-D echo 05/05/2012  Carotid Dopplers 05/05/2012  TEE pending  Antibiotics:  None  HPI/Subjective: Patient sitting up in chair anxious and awaiting TEE. Patient and family state that patient is very unsteady with a gait. Patient also complaining of some auditory and visual hallucinations. Patient also complaining of headache.  Objective: Filed Vitals:   05/05/12 2158 05/06/12 0206 05/06/12 0553 05/06/12  1042  BP: 125/65 100/54 149/81 127/79  Pulse: 91 78 79 101  Temp: 98.3 F (36.8 C) 98 F (36.7 C) 98.2 F (36.8 C) 98.1 F (36.7 C)  TempSrc: Oral Oral Oral Oral  Resp: 20 20 20 19   Height:      Weight:      SpO2: 98% 96% 100% 100%    Intake/Output Summary (Last 24 hours) at 05/06/12 1353 Last data filed at 05/06/12 0630  Gross per 24 hour  Intake 1561.25 ml  Output      0 ml  Net 1561.25 ml   Filed Weights   05/05/12 2052  Weight: 146.33 kg (322 lb 9.6 oz)    Exam:   General:  NAD  Cardiovascular: rrr  Respiratory: ctab  Abdomen: Soft/ NT/ND/+BS  Data Reviewed: Basic Metabolic Panel:  Lab 05/04/12 1478 05/04/12 1756  NA -- 139  K -- 3.6  CL -- 104  CO2 -- --  GLUCOSE -- 99  BUN -- 5*  CREATININE 0.79 0.80  CALCIUM -- --  MG -- --  PHOS -- --   Liver Function Tests: No results found for this basename: AST:5,ALT:5,ALKPHOS:5,BILITOT:5,PROT:5,ALBUMIN:5 in the last 168 hours No results found for this basename: LIPASE:5,AMYLASE:5 in the last 168 hours No results found for this basename: AMMONIA:5 in the last 168 hours CBC:  Lab 05/04/12 2137 05/04/12 1756 05/04/12 1733  WBC 9.0 -- 8.1  NEUTROABS -- -- 4.1  HGB 13.1 14.3 13.0  HCT 39.3 42.0 39.4  MCV 89.1 -- 89.5  PLT 311 -- 297   Cardiac Enzymes: No results found for this basename: CKTOTAL:5,CKMB:5,CKMBINDEX:5,TROPONINI:5 in the last 168 hours BNP (last 3 results) No results found for this basename: PROBNP:3 in the last 8760 hours CBG: No results found for this basename: GLUCAP:5 in the last 168 hours  No results found for this or any previous visit (  from the past 240 hour(s)).   Studies: Ct Head Wo Contrast  05/04/2012  *RADIOLOGY REPORT*  Clinical Data: Weakness.  Unable views left arm.  CT HEAD WITHOUT CONTRAST  Technique:  Contiguous axial images were obtained from the base of the skull through the vertex without contrast.  Comparison: CT head without contrast 08/09/2007.  Findings: Focal  hypoattenuation involves the anterior insular cortex and frontal operculum.  There is no associated hemorrhage. There is focal sulcal effacement.  No other focal cortical infarct is evident.  Scattered subcortical white matter hypoattenuation is present bilaterally.  The ventricles are of normal size.  No significant extra-axial fluid collection is present.  The paranasal sinuses and mastoid air cells are clear.  The osseous skull is intact.  IMPRESSION:  1.  Interval right MCA territory infarct involving the insular cortex and inferior right frontal operculum.  This is likely acute. 2.  There is some local mass effect without significant midline shift. 3.  Scattered subcortical white matter hypoattenuation bilaterally likely reflects the sequelae of chronic microvascular ischemia.  These results were called by telephone on 05/04/2012 at 03:45 p.m. to Dr. Ranae Palms, who verbally acknowledged these results.   Original Report Authenticated By: Marin Roberts, M.D.    Mri Brain Without Contrast  05/05/2012  *RADIOLOGY REPORT*  Clinical Data:  Left arm weakness.  Hypertension.  MRI BRAIN WITHOUT CONTRAST MRA HEAD WITHOUT CONTRAST  Technique: Multiplanar, multiecho pulse sequences of the brain and surrounding structures were obtained according to standard protocol without intravenous contrast.  Angiographic images of the head were obtained using MRA technique without contrast.  Comparison: 05/04/2012 head CT.  No comparison brain MR.  MRI HEAD  Findings:  Motion degraded exam.  Acute non hemorrhagic right hemispheric infarcts involving portions of the right opercular region, right sub insular region, right frontal lobe and right parietal lobe. Mild mass effect with slight compression of the right lateral ventricle without midline shift.  No intracranial hemorrhage.  No intracranial mass lesion detected on this unenhanced exam.  No hydrocephalus.  Partially empty sella.  Minimal asymmetry of the hippocampi.   IMPRESSION: Acute total right hemispheric infarct as noted above.  MRA HEAD  Findings: Motion degraded exam.  Abrupt cut off left flow at the right carotid terminus with very few right middle cerebral artery branch vessels visualized.  Poor delineation of a majority of the anterior cerebral arteries bilaterally.  Small caliber left internal carotid arteries suggesting proximal stenosis with occlusion or significant narrowing of the left internal carotid artery after the takeoff of the left ophthalmic artery.  Poor delineation of a majority of the left middle cerebral artery branches which appear significantly narrowed irregular.  On source sequence, increased number of vessels in the suprasellar cistern and ambient cistern.  Increased vasculature right occipital lobe.  On FLAIR imaging, altered flow is seen within intracranial vasculature.  These findings may represent result of slow flow from prominent intracranial atherosclerotic type changes and subsequent collateral flow.  Vascular malformation not excluded.  The patient would benefit from catheter angiography.  Right vertebral artery is dominant.  Irregularity of both vertebral arteries with mild narrowing.  Poor delineation of the PICAs.  Small caliber basilar artery without high-grade stenosis.  Markedly irregular appearance of the superior cerebral arteries and posterior cerebral arteries with poor delineation of the majority of the left posterior cerebral artery.  High-grade stenosis proximal right posterior cerebral artery.  Limited for detection of aneurysm.  IMPRESSION: Markedly abnormal motion degraded MR angiogram of the  circle Willis.  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  Small caliber left internal carotid artery suggesting proximal stenosis with occlusion or high-grade stenosis of the left internal carotid artery after takeoff of left ophthalmic artery.  Significant increased vasculature as  seen on source imaging may represent result of collateral flow although vascular malformation is not excluded and catheter angiogram of the neck and intracranial vasculature may be considered.  This has been made a PRA call report utilizing dashboard call feature.   Original Report Authenticated By: Lacy Duverney, M.D.    Mr Mra Head/brain Wo Cm  05/05/2012  *RADIOLOGY REPORT*  Clinical Data:  Left arm weakness.  Hypertension.  MRI BRAIN WITHOUT CONTRAST MRA HEAD WITHOUT CONTRAST  Technique: Multiplanar, multiecho pulse sequences of the brain and surrounding structures were obtained according to standard protocol without intravenous contrast.  Angiographic images of the head were obtained using MRA technique without contrast.  Comparison: 05/04/2012 head CT.  No comparison brain MR.  MRI HEAD  Findings:  Motion degraded exam.  Acute non hemorrhagic right hemispheric infarcts involving portions of the right opercular region, right sub insular region, right frontal lobe and right parietal lobe. Mild mass effect with slight compression of the right lateral ventricle without midline shift.  No intracranial hemorrhage.  No intracranial mass lesion detected on this unenhanced exam.  No hydrocephalus.  Partially empty sella.  Minimal asymmetry of the hippocampi.  IMPRESSION: Acute total right hemispheric infarct as noted above.  MRA HEAD  Findings: Motion degraded exam.  Abrupt cut off left flow at the right carotid terminus with very few right middle cerebral artery branch vessels visualized.  Poor delineation of a majority of the anterior cerebral arteries bilaterally.  Small caliber left internal carotid arteries suggesting proximal stenosis with occlusion or significant narrowing of the left internal carotid artery after the takeoff of the left ophthalmic artery.  Poor delineation of a majority of the left middle cerebral artery branches which appear significantly narrowed irregular.  On source sequence, increased  number of vessels in the suprasellar cistern and ambient cistern.  Increased vasculature right occipital lobe.  On FLAIR imaging, altered flow is seen within intracranial vasculature.  These findings may represent result of slow flow from prominent intracranial atherosclerotic type changes and subsequent collateral flow.  Vascular malformation not excluded.  The patient would benefit from catheter angiography.  Right vertebral artery is dominant.  Irregularity of both vertebral arteries with mild narrowing.  Poor delineation of the PICAs.  Small caliber basilar artery without high-grade stenosis.  Markedly irregular appearance of the superior cerebral arteries and posterior cerebral arteries with poor delineation of the majority of the left posterior cerebral artery.  High-grade stenosis proximal right posterior cerebral artery.  Limited for detection of aneurysm.  IMPRESSION: Markedly abnormal motion degraded MR angiogram of the circle Willis.  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  Small caliber left internal carotid artery suggesting proximal stenosis with occlusion or high-grade stenosis of the left internal carotid artery after takeoff of left ophthalmic artery.  Significant increased vasculature as seen on source imaging may represent result of collateral flow although vascular malformation is not excluded and catheter angiogram of the neck and intracranial vasculature may be considered.  This has been made a PRA call report utilizing dashboard call feature.   Original Report Authenticated By: Lacy Duverney, M.D.     Scheduled Meds:    .  aspirin  325 mg Oral Daily  . atorvastatin  20 mg Oral q1800  . citalopram  30 mg Oral Daily  . clonazePAM  1 mg Oral Daily  . enoxaparin  40 mg Subcutaneous Q24H  . hydrOXYzine  50 mg Oral TID  . lamoTRIgine  200 mg Oral BID  . [COMPLETED] LORazepam  2 mg Oral Once  . nicotine  21 mg Transdermal Daily  .  OLANZapine  15 mg Oral QHS  . thiothixene  2 mg Oral BID   Continuous Infusions:    . [EXPIRED] sodium chloride 75 mL/hr at 05/05/12 0941    Principal Problem:  *CVA (cerebral infarction) Active Problems:  Hypertension  Schizoaffective disorder, bipolar type  Hallucination  Hyperlipidemia  Tobacco abuse  Depression    Time spent: . 35 mins    [facility]  [facility] Pager [phone]. If 8PM-8AM, please contact night-coverage at [email or web address], password [email or web address] 05/06/2012, 1:53 PM  LOS: 2 days

## 2012-05-06 NOTE — Progress Notes (Signed)
Physical Therapy Treatment Patient Details Name: April [name] MRN: [id number] DOB: 1965/11/01 Today's Date: 05/06/2012 Time: 9562-1308 PT Time Calculation (min): 26 min  PT Assessment / Plan / Recommendation Comments on Treatment Session  Pt. requiring significantly increased (A) throughout transfers and ambulation with RW. Pt. unsteady and demonstrated poor safety awareness and poor safe use of RW. Pt. attempted stairs forwards with +1HHA and no rail to mimic home and backwards with RW needing (A) from SPTA. Will continue with working on ambulation and stairs as PTs goals warrant. Will be speaking with PT and recommending pt. d/c plans be changed to HHPT with 24 hour (A)/supervision to increase pt's safety once d/c'd from hospital.    Follow Up Recommendations  Home health PT;Supervision/Assistance - 24 hour     Does the patient have the potential to tolerate intense rehabilitation     Barriers to Discharge        Equipment Recommendations  None recommended by PT    Recommendations for Other Services    Frequency Min 3X/week   Plan Discharge plan needs to be updated    Precautions / Restrictions Precautions Precautions: Fall Restrictions Weight Bearing Restrictions: No   Pertinent Vitals/Pain Patient reports nausea, no pain. RN notified    Mobility  Bed Mobility Bed Mobility: Supine to Sit;Sitting - Scoot to Edge of Bed Supine to Sit: 6: Modified independent (Device/Increase time);HOB flat Sitting - Scoot to Edge of Bed: 6: Modified independent (Device/Increase time) Details for Bed Mobility Assistance: Some increased time with getting to the EOB during todays session due to pt. reporting nausea and feeling sore from falling in bathroom the previous evening. Transfers Transfers: Sit to Stand;Stand to Sit Sit to Stand: 4: Min guard;With upper extremity assist;From bed;From chair/3-in-1;With armrests Stand to Sit: 4: Min guard;With upper extremity assist;With armrests;To  chair/3-in-1 Details for Transfer Assistance: Pt. min guard with transfers during todays session for safety and steadiness and max VC for proper hand placement when stepping to/sitting from RW. Pt. encouraged to slow down and focus on tasks so that she could be more safe.  Ambulation/Gait Ambulation/Gait Assistance: 4: Min assist;3: Mod assist Ambulation Distance (Feet): 150 Feet Assistive device: Rolling walker Ambulation/Gait Assistance Details: Pt. min (A) with times of mod (A) with ambulation with RW. Pt. mod (A) with directional changes and as gt. progressed. Max VC for safe use of RW, positioning of RW, to stay close, but not too close to RW all throughout ambulation. Pt. with increased unsteadiness from previous documented sessions and knee buckling on her Lt. LE. Pt reported that she was only feeling slight dizziness and feelings of a panic attack near end of ambulation; RN notified. Gait Pattern: Step-through pattern;Decreased stride length Stairs: Yes Stairs Assistance: 4: Min assist Stairs Assistance Details (indicate cue type and reason): Pt. min (A) with stair navigation with max VC throughout for technique, sequence, and proper hand placement. Attempted forward with +1HHA and no rail (to mimic home), but pt. consistently reaching for rail. Attempted backwards with RW and pt. still needing min (A) and max cues throughout for safe execution. Pt. reports she would have someone with her at home to navigate stairs. Stair Management Technique: No rails;One rail Right;Step to pattern;Backwards;Forwards;With walker Number of Stairs: 3  (x2trials) Wheelchair Mobility Wheelchair Mobility: No Modified Rankin (Stroke Patients Only) Pre-Morbid Rankin Score: No symptoms Modified Rankin: Moderately severe disability      PT Goals Acute Rehab PT Goals PT Goal Formulation: With patient Time For Goal Achievement: 05/12/12 Potential  to Achieve Goals: Good Pt will Ambulate: >150 feet;with  modified independence PT Goal: Ambulate - Progress: Progressing toward goal Pt will Go Up / Down Stairs: 3-5 stairs;with modified independence PT Goal: Up/Down Stairs - Progress: Progressing toward goal Pt will Perform Home Exercise Program: Independently  Visit Information  Last PT Received On: 05/06/12 Assistance Needed: +1    Subjective Data  Subjective: "I feel last night, did you hear about that?" Patient Stated Goal: Return home   Cognition  Overall Cognitive Status: Impaired Area of Impairment: Safety/judgement;Awareness of deficits Arousal/Alertness: Awake/alert Orientation Level: Appears intact for tasks assessed Behavior During Session: [facility] for tasks performed Safety/Judgement: Decreased awareness of safety precautions;Decreased safety judgement for tasks assessed Safety/Judgement - Other Comments: Pt with education of using RW and max VC for safe use of RW throughout session Awareness of Deficits: Pt. asking if she was going to be ok to go home and be on her own, with increased unsteadiness and poor safety awareness throughout session.    Balance     End of Session PT - End of Session Equipment Utilized During Treatment: Gait belt Activity Tolerance: Patient tolerated treatment well Patient left: in chair;with call bell/phone within reach;with family/visitor present Nurse Communication: Mobility status;Other (comment) (Need for chair alarm)    STROUD, LAURA,SPTA 05/06/2012, 3:53 PM  Agree with above assessment.  Lewis Shock, PT, DPT Pager #: [phone] Office #: [phone]

## 2012-05-06 NOTE — Interval H&P Note (Signed)
History and Physical Interval Note:  05/06/2012 4:19 PM  April [name]  has presented today for surgery, with the diagnosis of CVA  The various methods of treatment have been discussed with the patient and family. After consideration of risks, benefits and other options for treatment, the patient has consented to  Procedure(s) (LRB) with comments: TRANSESOPHAGEAL ECHOCARDIOGRAM (TEE) (N/A) as a surgical intervention .  The patient's history has been reviewed, patient examined, no change in status, stable for surgery.  I have reviewed the patient's chart and labs.  Questions were answered to the patient's satisfaction.     Dakwan Pridgen [employer]

## 2012-05-06 NOTE — Progress Notes (Signed)
Pt called and c/o leaking around R forearm  IV site.  Checked IV site and forearm appeared red and was leaking.  Pt c/o painfulness around the site.  IV site was d/c and rotated to L wrist since pt needing TEE today.  New IV site looks clean dry and intact and flushes well.  Offered warm compresses to old IV site.  Pt resting comfortably.  Will continue to monitor

## 2012-05-06 NOTE — CV Procedure (Signed)
Procedure: TEE  Indication: CVA  Sedation: Versed 8 mg IV, Fentanyl 100 mcg IV  Findings: Normal LV size and systolic function, EF 60-65%.  Normal RV size and systolic function.  No left atrial appendage thrombus.  No PFO or ASD.  Mild plaque in descending thoracic aorta.  No source of embolus.   No complications.   Marca Ancona 05/06/2012 4:33 PM

## 2012-05-06 NOTE — Progress Notes (Signed)
Physical Therapy Treatment Patient Details Name: April [name] MRN: [id number] DOB: [birth year] Today's Date: 05/06/2012 Time: 4098-1191 PT Time Calculation (min): 26 min  PT Assessment / Plan / Recommendation Comments on Treatment Session  Pt. requiring significantly increased (A) throughout transfers and ambulation with RW. Pt. unsteady and demonstrated poor safety awareness and poor safe use of RW. Pt. attempted stairs forwards with +1HHA and no rail to mimic home and backwards with RW needing (A) from SPTA. Will continue with working on ambulation and stairs as PTs goals warrant. Will be speaking with PT and recommending pt. d/c plans be changed to HHPT with 24 hour (A)/supervision to increase pt's safety once d/c'd from hospital.    Follow Up Recommendations  Home health PT;Supervision/Assistance - 24 hour     Does the patient have the potential to tolerate intense rehabilitation     Barriers to Discharge        Equipment Recommendations  None recommended by PT    Recommendations for Other Services    Frequency Min 3X/week   Plan Discharge plan needs to be updated    Precautions / Restrictions Precautions Precautions: Fall Restrictions Weight Bearing Restrictions: No   Pertinent Vitals/Pain Patient reports nausea, no pain. RN notified    Mobility  Bed Mobility Bed Mobility: Supine to Sit;Sitting - Scoot to Edge of Bed Supine to Sit: 6: Modified independent (Device/Increase time);HOB flat Sitting - Scoot to Edge of Bed: 6: Modified independent (Device/Increase time) Details for Bed Mobility Assistance: Some increased time with getting to the EOB during todays session due to pt. reporting nausea and feeling sore from falling in bathroom the previous evening. Transfers Transfers: Sit to Stand;Stand to Sit Sit to Stand: 4: Min guard;With upper extremity assist;From bed;From chair/3-in-1;With armrests Stand to Sit: 4: Min guard;With upper extremity assist;With armrests;To  chair/3-in-1 Details for Transfer Assistance: Pt. min guard with transfers during todays session for safety and steadiness and max VC for proper hand placement when stepping to/sitting from RW. Pt. encouraged to slow down and focus on tasks so that she could be more safe.  Ambulation/Gait Ambulation/Gait Assistance: 4: Min assist;3: Mod assist Ambulation Distance (Feet): 150 Feet Assistive device: Rolling walker Ambulation/Gait Assistance Details: Pt. min (A) with times of mod (A) with ambulation with RW. Pt. mod (A) with directional changes and as gt. progressed. Max VC for safe use of RW, positioning of RW, to stay close, but not too close to RW all throughout ambulation. Pt. with increased unsteadiness from previous documented sessions and knee buckling on her Lt. LE. Pt reported that she was only feeling slight dizziness and feelings of a panic attack near end of ambulation; RN notified. Gait Pattern: Step-through pattern;Decreased stride length Stairs: Yes Stairs Assistance: 4: Min assist Stairs Assistance Details (indicate cue type and reason): Pt. min (A) with stair navigation with max VC throughout for technique, sequence, and proper hand placement. Attempted forward with +1HHA and no rail (to mimic home), but pt. consistently reaching for rail. Attempted backwards with RW and pt. still needing min (A) and max cues throughout for safe execution. Pt. reports she would have someone with her at home to navigate stairs. Stair Management Technique: No rails;One rail Right;Step to pattern;Backwards;Forwards;With walker Number of Stairs: 3  (x2trials) Wheelchair Mobility Wheelchair Mobility: No Modified Rankin (Stroke Patients Only) Pre-Morbid Rankin Score: No symptoms Modified Rankin: Moderately severe disability      PT Goals Acute Rehab PT Goals PT Goal Formulation: With patient Time For Goal Achievement: 05/12/12 Potential  to Achieve Goals: Good Pt will Ambulate: >150 feet;with  modified independence PT Goal: Ambulate - Progress: Progressing toward goal Pt will Go Up / Down Stairs: 3-5 stairs;with modified independence PT Goal: Up/Down Stairs - Progress: Progressing toward goal Pt will Perform Home Exercise Program: Independently  Visit Information  Last PT Received On: 05/06/12 Assistance Needed: +1    Subjective Data  Subjective: "I feel last night, did you hear about that?" Patient Stated Goal: Return home   Cognition  Overall Cognitive Status: Impaired Area of Impairment: Safety/judgement;Awareness of deficits Arousal/Alertness: Awake/alert Orientation Level: Appears intact for tasks assessed Behavior During Session: [facility] for tasks performed Safety/Judgement: Decreased awareness of safety precautions;Decreased safety judgement for tasks assessed Safety/Judgement - Other Comments: Pt with education of using RW and max VC for safe use of RW throughout session Awareness of Deficits: Pt. asking if she was going to be ok to go home and be on her own, with increased unsteadiness and poor safety awareness throughout session.    Balance     End of Session PT - End of Session Equipment Utilized During Treatment: Gait belt Activity Tolerance: Patient tolerated treatment well Patient left: in chair;with call bell/phone within reach;with family/visitor present Nurse Communication: Mobility status;Other (comment) (Need for chair alarm)    Artie Mcintyre,SPTA 05/06/2012, 3:53 PM

## 2012-05-07 ENCOUNTER — Encounter (HOSPITAL_COMMUNITY): Payer: Self-pay | Admitting: Cardiology

## 2012-05-07 DIAGNOSIS — R443 Hallucinations, unspecified: Secondary | ICD-10-CM

## 2012-05-07 LAB — BASIC METABOLIC PANEL
BUN: 13 mg/dL (ref 6–23)
CO2: 26 mEq/L (ref 19–32)
Chloride: 104 mEq/L (ref 96–112)
Creatinine, Ser: 1.07 mg/dL (ref 0.50–1.10)
Glucose, Bld: 95 mg/dL (ref 70–99)

## 2012-05-07 LAB — CBC
HCT: 36.5 % (ref 36.0–46.0)
MCH: 29.1 pg (ref 26.0–34.0)
MCV: 90.1 fL (ref 78.0–100.0)
RDW: 13.5 % (ref 11.5–15.5)
WBC: 7.3 10*3/uL (ref 4.0–10.5)

## 2012-05-07 LAB — PROTEIN S, TOTAL: Protein S Ag, Total: 108 % (ref 60–150)

## 2012-05-07 NOTE — Progress Notes (Signed)
Stroke Team Progress Note  HISTORY April [name] is an 46 y.o. female with a history of hypertension, bipolar disorder and schizoaffective disorder, presenting with weakness and numbness involving left upper extremity since 04/30/2012. April [name] noticed onset of symptoms around noon of that day. She's had slight tingling involving left lower face as well. She's had no changes in speech. She has not noticed any left lower extremity weakness. April [name] has not been on antiplatelet therapy. She has no previous history of stroke nor TIA. CT scan of her head showed right MCA territory lesion consistent with recent infarction, including mild signs of edema without mass effect. NIH stroke score was 2. She presented to the ED 05/04/2012. April [name] was not a TPA candidate secondary to delay in arrival. She was admitted for further evaluation and treatment.  SUBJECTIVE April [name] in a dark room, complains of a headache. Wet cloth over her face.  OBJECTIVE Most recent Vital Signs: Filed Vitals:   05/06/12 1650 05/06/12 1730 05/06/12 2118 05/07/12 0558  BP: 154/95  115/69 120/70  Pulse:   96 75  Temp:   98.5 F (36.9 C) 98 F (36.7 C)  TempSrc:   Oral Oral  Resp:  18 20 18   Height:      Weight:      SpO2:  98% 97% 97%   CBG (last 3)  No results found for this basename: GLUCAP:3 in the last 72 hours  IV Fluid Intake:      . sodium chloride 20 mL/hr at 05/06/12 1744   MEDICATIONS     . aspirin  81 mg Oral Daily  . atorvastatin  20 mg Oral q1800  . citalopram  30 mg Oral Daily  . clonazePAM  1 mg Oral Daily  . clopidogrel  75 mg Oral Q breakfast  . enoxaparin  40 mg Subcutaneous Q24H  . hydrOXYzine  50 mg Oral TID  . lamoTRIgine  200 mg Oral BID  . nicotine  21 mg Transdermal Daily  . OLANZapine  15 mg Oral QHS  . thiothixene  2 mg Oral BID  . [DISCONTINUED] aspirin  325 mg Oral Daily   PRN:  ketorolac, ondansetron, oxyCODONE, promethazine, [DISCONTINUED] fentaNYL, [DISCONTINUED] midazolam,  [DISCONTINUED] zolpidem  Diet:  General  Activity:   Bathroom privileges with assistance DVT Prophylaxis:  Lovenox 40 mg sq daily   CLINICALLY SIGNIFICANT STUDIES Basic Metabolic Panel:   Lab 05/07/12 0545 05/04/12 2137 05/04/12 1756  NA 139 -- 139  K 4.3 -- 3.6  CL 104 -- 104  CO2 26 -- --  GLUCOSE 95 -- 99  BUN 13 -- 5*  CREATININE 1.07 0.79 --  CALCIUM 8.9 -- --  MG -- -- --  PHOS -- -- --   Liver Function Tests: No results found for this basename: AST:2,ALT:2,ALKPHOS:2,BILITOT:2,PROT:2,ALBUMIN:2 in the last 168 hours CBC:   Lab 05/07/12 0545 05/04/12 2137 05/04/12 1733  WBC 7.3 9.0 --  NEUTROABS -- -- 4.1  HGB 11.8* 13.1 --  HCT 36.5 39.3 --  MCV 90.1 89.1 --  PLT 268 311 --   Coagulation:   Lab 05/04/12 1733  LABPROT 12.1  INR 0.90   Cardiac Enzymes: No results found for this basename: CKTOTAL:3,CKMB:3,CKMBINDEX:3,TROPONINI:3 in the last 168 hours Urinalysis:   Lab 05/06/12 2030  COLORURINE YELLOW  LABSPEC 1.009  PHURINE 5.0  GLUCOSEU NEGATIVE  HGBUR NEGATIVE  BILIRUBINUR NEGATIVE  KETONESUR NEGATIVE  PROTEINUR NEGATIVE  UROBILINOGEN 0.2  NITRITE NEGATIVE  LEUKOCYTESUR NEGATIVE   Lipid Panel    Component  Value Date/Time   CHOL 232* 05/05/2012 0550   TRIG 274* 05/05/2012 0550   HDL 27* 05/05/2012 0550   CHOLHDL 8.6 05/05/2012 0550   VLDL 55* 05/05/2012 0550   LDLCALC 150* 05/05/2012 0550   HgbA1C  Lab Results  Component Value Date   HGBA1C 6.3* 05/04/2012    Urine Drug Screen:     Component Value Date/Time   LABOPIA NONE DETECTED 05/06/2012 0619   LABOPIA NEGATIVE 06/08/2007 2320   COCAINSCRNUR NONE DETECTED 05/06/2012 0619   COCAINSCRNUR NEGATIVE 06/08/2007 2320   LABBENZ NONE DETECTED 05/06/2012 0619   LABBENZ  Value: POSITIVE (NOTE) Result repeated and verified. Sent for confirmatory testing* 06/08/2007 2320   AMPHETMU NONE DETECTED 05/06/2012 0619   AMPHETMU NEGATIVE 06/08/2007 2320   THCU POSITIVE* 05/06/2012 0619   LABBARB NONE DETECTED 05/06/2012  0619    Alcohol Level: No results found for this basename: ETH:2 in the last 168 hours  CT of the brain   1.  Interval right MCA territory infarct involving the insular cortex and inferior right frontal operculum.  This is likely acute. 2.  There is some local mass effect without significant midline shift. 3.  Scattered subcortical white matter hypoattenuation bilaterally likely reflects the sequelae of chronic microvascular ischemia.   MRI of the brain  Acute non hemorrhagic right hemispheric infarcts involving portions of the right opercular region, right sub insular region, right frontal lobe and right parietal lobe. Mild mass effect with slight compression of the right lateral ventricle without midline shift.  MRA of the brain  Markedly abnormal motion degraded MR angiogram of the circle Willis.  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  Small caliber left internal carotid artery suggesting proximal stenosis with occlusion or high-grade stenosis of the left internal carotid artery after takeoff of left ophthalmic artery.  Significant increased vasculature as seen on source imaging may represent result of collateral flow although vascular malformation is not excluded and catheter angiogram of the neck and intracranial vasculature may be considered.   2D Echocardiogram  EF 60-65% with no source of embolus.  Carotid Doppler  No evidence of hemodynamically significant internal carotid artery stenosis. Vertebral artery flow is antegrade.   TEE Normal LV size and systolic function, EF 60-65%. Normal RV size and systolic function. No left atrial appendage thrombus. No PFO or ASD. Mild plaque in descending thoracic aorta. No source of embolus.   CXR    EKG  sinus tachycardia.   Therapy Recommendations PT - none; OT - none  Physical Exam   April [name] currently not in distress.Awake alert. Afebrile. Head is nontraumatic. Neck is  supple without bruit. Hearing is normal. Cardiac exam no murmur or gallop. Lungs are clear to auscultation. Distal pulses are well felt.  Neurological exam : Awake  Alert oriented x 3. Normal speech and language.eye movements full without nystagmus.fundi were not visualized. Vision acuity and fields appear normal Face symmetric. Tongue midline. Normal strength, tone, reflexes and coordination. Normal sensation. Gait deferred.   ASSESSMENT April [name] is a 46 y.o. female presenting with numbness in her left arm with weakness. 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 Infarct felt to be likely embolic secondary to unknown etiology.  Work up completed. On no antiplatelets prior to admission. Now on aspirin 325 mg orally every day for secondary stroke prevention. April [name] with resultant mild left arm numbness   Migraines -  worse this am. Has received fentanyl, toradol, oxycodone, phenergan today Hyperlipidemia, LDL 150, not on statin PTA, now on lipitor, goal LDL < 100 HgbA1c 6.3 Cigarette smoker Hypertension Hypercoagulable panel negative thus far, complete results pending Schizophrenia   Hospital day # 3  TREATMENT/PLAN  Continue aspirin 81 mg and  clopidogrel 75 mg orally every day for secondary stroke prevention x 3 mos then aspirin alone. F/u Hypercoagulable panel  Please schedule outpatient telemetry monitoring to assess April [name] for atrial fibrillation as source of stroke. May be arranged with April [name]'s cardiologist, or cardiologist of choice.  Please schedule an outpatient TCD bubble study with emboli monitoring with Dr. Pearlean Brownie in 1 month to further evaluate for possible PFO. Have April [name] call for appointment.  Ok for discharge from neuro standpoint. Stroke Service will sign off. Follow up with Dr. Pearlean Brownie, Stroke Clinic, in 2 months.  Dr. Pearlean Brownie discussed diagnosis, prognosis and plan of care with Dr. Janee Morn.     Annie Main, MSN, RN, ANVP-BC, ANP-BC, GNP-BC [facility] Pager: [phone] 05/07/2012 9:52 AM   I have personally examined this April [name],reviewed pertinent data and developed plan of care. I agree with above   Delia Heady, MD Medical Director [facility] Pager: [phone] 05/07/2012 9:52 AM

## 2012-05-07 NOTE — Progress Notes (Signed)
CARE MANAGEMENT NOTE 05/07/2012  Patient:  April [name],April [name]   Account Number:  [id number]  Date Initiated:  05/07/2012  Documentation initiated by:  April [name]  Subjective/Objective Assessment:   46 yr old female admitted  r/o CVA     Action/Plan:   CM spoke with patient concerning home health and DME needs at discharge.   Anticipated DC Date:  05/08/2012   Anticipated DC Plan:  HOME W HOME HEALTH SERVICES      DC Planning Services  CM consult      [facility] Choice  HOME HEALTH  DURABLE MEDICAL EQUIPMENT   Choice offered to / List presented to:  [name]   DME arranged  April [name]      DME agency  [facility]     HH arranged  HH-2 PT      [facility] agency  [facility]   Status of service:  Completed, signed off Medicare Important Message given?   (If response is "NO", the following Medicare IM given date fields will be blank) Date Medicare IM given:   Date Additional Medicare IM given:    Discharge Disposition:  HOME W HOME HEALTH SERVICES  Per UR Regulation:    If discussed at Long Length of Stay Meetings, dates discussed:    Comments:

## 2012-05-07 NOTE — Progress Notes (Signed)
OT Cancellation Note  Patient Details Name: Riah Kehoe MRN: [id number] DOB: 01/07/1966   Cancelled Treatment:    Reason Eval/Treat Not Completed: Other (comment) (Pt with migraine requested therapy to come back later.)  Karsyn Rochin 05/07/2012, 12:08 PM

## 2012-05-07 NOTE — Progress Notes (Signed)
Clinical Social Work Department CLINICAL SOCIAL WORK PSYCHIATRY SERVICE LINE ASSESSMENT 05/07/2012  Patient:  April [name]  Account:  [id number]  Admit Date:  05/04/2012  Clinical Social Worker:  Unk Lightning, LCSW  Date/Time:  05/07/2012 09:30 AM Referred by:  Physician  Date referred:  05/07/2012 Reason for Referral  Behavioral Health Issues   Presenting Symptoms/Problems (In the person's/family's own words):   "I stopped taking my medications for schizoaffective."    MD consulted psych due to patient experiencing visual hallucinations.   Abuse/Neglect/Trauma History (check all that apply)  Denies history   Abuse/Neglect/Trauma Comments:   Psychiatric History (check all that apply)  Outpatient treatment   Psychiatric medications:  Celexa, Klonopin, Lamictal, Zyprexa   Current Mental Health Hospitalizations/Previous Mental Health History:   Patient was diagnosed with schizoaffective disorder. Patient has been on medication for disorder but stopped taking her medications for the past month.   Current provider:   Vickii Penna and Date:   October 2013   Current Medications:   ketorolac, ondansetron, oxyCODONE, promethazine, [DISCONTINUED] fentaNYL, [DISCONTINUED] midazolam, [DISCONTINUED] zolpidem                        . aspirin  81 mg Oral Daily  . atorvastatin  20 mg Oral q1800  . citalopram  30 mg Oral Daily  . clonazePAM  1 mg Oral Daily  . clopidogrel  75 mg Oral Q breakfast  . enoxaparin  40 mg Subcutaneous Q24H  . hydrOXYzine  50 mg Oral TID  . lamoTRIgine  200 mg Oral BID  . nicotine  21 mg Transdermal Daily  . OLANZapine  15 mg Oral QHS  . thiothixene  2 mg Oral BID  . [DISCONTINUED] aspirin  325 mg Oral Daily   Previous Impatient Admission/Date/Reason:   None reported   Emotional Health / Current Symptoms    Suicide/Self Harm  None reported   Suicide attempt in the past:   Other harmful behavior:   Psychotic/Dissociative Symptoms  Visual  Hallucinations   Other Psychotic/Dissociative Symptoms:   Patient denies auditory hallucinations. Patient has seen brother and friend's son in room when they are not actually present.    Attention/Behavioral Symptoms  Within Normal Limits   Other Attention / Behavioral Symptoms:    Cognitive Impairment  Within Normal Limits   Other Cognitive Impairment:    Mood and Adjustment  Mood Congruent    Stress, Anxiety, Trauma, Any Recent Loss/Stressor  Other - See comment   Anxiety (frequency):   Phobia (specify):   Compulsive behavior (specify):   Obsessive behavior (specify):   Other:   Patient had transportation issues to go to [place] and did not like her psychiatrist.   Substance Abuse/Use  None   SBIRT completed (please refer for detailed history):  N  Self-reported substance use:   Patient denies any substance use   Urinary Drug Screen Completed:  N Alcohol level:    Environmental/Housing/Living Arrangement  Stable housing   Who is in the home:   Patient lives with 2 friends   Emergency contact:  Justin-son   Financial  Medicaid   Patient's Strengths and Goals (patient's own words):   "I know I need to take better care of myself. This scared me and I'm going to do better."   Clinical Social Worker's Interpretive Summary:   CSW received referral due to patient having hallucinations. CSW reviewed chart and met with patient at bedside. No visitors present.    CSW  introduced myself and explained role. Patient was laying in bed with all lights off. Patient reports she has a migraine and the lights were making it worse. Patient agreeable to CSW consult at this time. Patient reports she was admitted due to having a stroke. Patient reports she feels the stroke is an effect from when she stopped taking her psychotropic medications. Patient reports that she follows up at [facility]. Patient does not like her psychiatrist but reports that she was fired so she will have a  new MD. Patient reports that she has to go monthly for refills and struggles with finding rides. Patient reports that she stopped taking her medications because her friends and family stated that she was taking too much medications and it would hurt her liver. CSW spoke with patient regarding voicing those concerns with MD in order to determine if that is correct. Patient reports she is scared that she had a stroke and will follow up at [facility]. Patient provided patient with referral for Medicaid transportation [facility]) in order to assist with rides.    Patient reports that she has been having visual hallucinations since being hospitalized. Patient reports that she has had hallucinations in the past. Patient reports her symptoms are usually controlled but contributes these hallucinations to not being on her medication for about a month. Patient reports he was diagnosed with schizoaffective disorder and struggles with depression. Patient has not been hospitalized and reports she does not have a therapist. Patient had been compliant with attending appointments and taking her medications until recently. Patient reports no hallucinations today. Patient denies any SI or HI. CSW and patient discussed reality-testing techniques in regards to hallucinations and patient is aware. Patient feels symptoms will be controlled once back on medication.    Patient was appropriate during assessment. Patient shows good insight in regards to medical problems and the need for medication. Patient feels like a burden in regards to transportation and feels that once she gets set up with Medicaid transportation that she will be able to attend all appointments. CSW will staff case with psych MD.   Disposition:  Psych Clinical Social Worker signing off

## 2012-05-07 NOTE — Consult Note (Addendum)
Patient Identification:  April [name] Date of Evaluation:  05/07/2012 Reason for Consult:  Auditory/visual Hallucinations, s/p CVA  Referring Provider: Dr. Janee Morn  History of Present Illness:Pt says she was only home two days, had no money to get prescriptions filled, and had a poor appetite, felt sick and numb; called EMT.  Evaluation at ED found L sided CVA with associated R sided numbness of face, arm, no lower extremitie involvement.  Past Psychiatric   She has diagnosis of Schizoaffective disorder, bipolar type.  She has no funds to buy her psych medication:  Celexa 30 mg, Klonopin 1 mg, Lamictal 200 mg, Zyprexa 1 mg, Navane 2 mg.   Past Medical History:     Past Medical History  Diagnosis Date  . Hypertension   . Bipolar 1 disorder   . Schizo affective schizophrenia   . Depression, major   . Hyperlipidemia 05/06/2012  . Tobacco abuse 05/06/2012  . Depression 05/06/2012       Past Surgical History  Procedure Date  . Cholecystectomy   . Abdominal hysterectomy   . Tee without cardioversion 05/06/2012    Procedure: TRANSESOPHAGEAL ECHOCARDIOGRAM (TEE);  Surgeon: Laurey Morale, MD;  Location: [facility] ENDOSCOPY;  Service: Cardiovascular;  Laterality: N/A;    Allergies:  Allergies  Allergen Reactions  . Morphine And Related Hives and Itching    Current Medications:  Prior to Admission medications   Medication Sig Start Date End Date Taking? Authorizing Provider  citalopram (CELEXA) 20 MG tablet Take 30 mg by mouth daily.   Yes Historical Provider, MD  clonazePAM (KLONOPIN) 1 MG tablet Take 1.5 mg by mouth daily. Scheduled dose   Yes Historical Provider, MD  cloNIDine (CATAPRES) 0.1 MG tablet Take 0.1 mg by mouth 2 (two) times daily.   Yes Historical Provider, MD  hydrOXYzine (ATARAX/VISTARIL) 50 MG tablet Take 50 mg by mouth 3 (three) times daily. Scheduled doses   Yes Historical Provider, MD  lamoTRIgine (LAMICTAL) 200 MG tablet Take 200 mg by mouth 2 (two) times daily.   Yes  Historical Provider, MD  lithium carbonate 300 MG capsule Take 300 mg by mouth 2 (two) times daily with a meal.   Yes Historical Provider, MD  OLANZapine (ZYPREXA) 15 MG tablet Take 15 mg by mouth at bedtime.   Yes Historical Provider, MD  thiothixene (NAVANE) 2 MG capsule Take 2 mg by mouth 2 (two) times daily.   Yes Historical Provider, MD    Social History:    reports that she has been smoking.  She does not have any smokeless tobacco history on file. She reports that she drinks alcohol. She reports that she uses illicit drugs (Marijuana).   Family History:    History reviewed. No pertinent family history.  Mental Status Examination/Evaluation: Objective:  Appearance: Casual  Obese  Eye Contact::  Good  Speech:  Clear and Coherent and Normal Rate  Volume:  Normal  Mood:  depressed  Affect:  Appropriate, Congruent and worried about loss of appetite and CVA  Thought Process:  Coherent and Logical  Orientation:  Full (Time, Place, and Person)  Thought Content:  concern re; residuzl effects of CVA nad lack of medications  Suicidal Thoughts:  No  Homicidal Thoughts:  No  Judgement:  Fair  Insight:  Fair   DIAGNOSIS:   AXIS I  Schizoaffective DO, bipolar type;   AXIS II  Deferred  AXIS III See medical notes.  AXIS IV economic problems, other psychosocial or environmental problems, problems related to social environment,  problems with primary support group and inability to support sefl and medical needs  AXIS V 51-60 moderate symptoms   Assessment/Plan:  Paged Dr. Janee Morn, Discussed with Psych CSW Pt is in bed, in darkened room.  With light on, She appears obese.  She complains about  inability to get her medication.  She briefly refers to arm weakness.  She does not endorse AV Hallucinations but refers to weakness.  She has a cooperative affect and speech slurred due to just receiving medication.  She c/obeing so sleepy and does not want to talk further.  RECOMMENDATION:  1.  Pt  has capacity and denies suicidal thoughts. 2.  Pt takes Navane with Zyprexa, first and second antipsychotics.  Suggest SLOW  taper Navane with greater risk of serious side effects: tardive dyskinesia.  Zyprexa dose is adequate for Schizoaffective disorder.   3.  Will follow pt.  Brigit Doke MD 05/07/2012 11:24 AM

## 2012-05-07 NOTE — Progress Notes (Signed)
[facility] PROGRESS NOTE  April [name] ZOX:[id number] DOB: [birth year] DOA: 05/04/2012 PCP: No primary provider on file.  Assessment/Plan: #1 acute nonhemorrhagic right hemispheric infarcts involving portions of the right opercular region, right subinsular region, right frontal lobe, right parietal lobe, Likely embolic in nature. Carotid Dopplers negative for ICA stenosis. 2-D echo is negative for any source of emboli. TEE is negative for thrombus. No PFO or ASD. No source of emboli. Continue aspirin and Plavix x30 days per neurology and then full dose aspirin. Continue Lipitor. Risk factor modification. Tobacco cessation. PT OT. Neurology following and appreciate input and recommendations.  #2 auditory and visual hallucinations Patient does have a history of schizoaffective disorder and bipolar disorder. Patient states had stopped taking her psychiatric medications of a month ago. Patient is on Zyprexa, Lamictal, NAVANE, Celexa. Patient states is having visual hallucinations. Patient denies any suicidal or homicidal ideations. Psych consultation is pending.   #3 hyperlipidemia Lipitor.  #4 schizoaffective disorder/bipolar disorder Patient is having auditory and visual hallucinations. Patient states having taken her medications in the lumbar. Will continue his Zyprexa, Celexa, Lamictal ,navane for now. Psychiatry consultation is pending.   #5 tobacco abuse Nicotine patch. Tobacco cessation.  Code Status: Full Family Communication: Updated patient. No family at bedside Disposition Plan: Home when medically stable.   Consultants:  Neurology: Dr. Roseanne Reno 05/04/2012  Procedures:  CT head 05/04/2012  MRI head 05/04/2012  2-D echo 05/05/2012  Carotid Dopplers 05/05/2012  TEE 05/06/2012  Antibiotics:  None  HPI/Subjective: Patient sitting up in chair c/o HA. Patient also complaining of visual hallucinations. Patient also complaining of headache. Patient denies suicidal or  homicidal ideation. Patient states that she stopped taking her psychiatric medications over a month ago.  Objective: Filed Vitals:   05/06/12 1730 05/06/12 2118 05/07/12 0558 05/07/12 1400  BP:  115/69 120/70 132/76  Pulse:  96 75 100  Temp:  98.5 F (36.9 C) 98 F (36.7 C) 98.4 F (36.9 C)  TempSrc:  Oral Oral Oral  Resp: 18 20 18 18   Height:      Weight:      SpO2: 98% 97% 97% 97%    Intake/Output Summary (Last 24 hours) at 05/07/12 1450 Last data filed at 05/07/12 0841  Gross per 24 hour  Intake    240 ml  Output    400 ml  Net   -160 ml   Filed Weights   05/05/12 2052  Weight: 146.33 kg (322 lb 9.6 oz)    Exam:   General:  NAD  Cardiovascular: rrr  Respiratory: ctab  Abdomen: Soft/ NT/ND/+BS  Data Reviewed: Basic Metabolic Panel:  Lab 05/07/12 8119 05/04/12 2137 05/04/12 1756  NA 139 -- 139  K 4.3 -- 3.6  CL 104 -- 104  CO2 26 -- --  GLUCOSE 95 -- 99  BUN 13 -- 5*  CREATININE 1.07 0.79 0.80  CALCIUM 8.9 -- --  MG -- -- --  PHOS -- -- --   Liver Function Tests: No results found for this basename: AST:5,ALT:5,ALKPHOS:5,BILITOT:5,PROT:5,ALBUMIN:5 in the last 168 hours No results found for this basename: LIPASE:5,AMYLASE:5 in the last 168 hours No results found for this basename: AMMONIA:5 in the last 168 hours CBC:  Lab 05/07/12 0545 05/04/12 2137 05/04/12 1756 05/04/12 1733  WBC 7.3 9.0 -- 8.1  NEUTROABS -- -- -- 4.1  HGB 11.8* 13.1 14.3 13.0  HCT 36.5 39.3 42.0 39.4  MCV 90.1 89.1 -- 89.5  PLT 268 311 -- 297   Cardiac Enzymes: No  results found for this basename: CKTOTAL:5,CKMB:5,CKMBINDEX:5,TROPONINI:5 in the last 168 hours BNP (last 3 results) No results found for this basename: PROBNP:3 in the last 8760 hours CBG: No results found for this basename: GLUCAP:5 in the last 168 hours  No results found for this or any previous visit (from the past 240 hour(s)).   Studies: No results found.  Scheduled Meds:    . aspirin  81 mg Oral  Daily  . atorvastatin  20 mg Oral q1800  . citalopram  30 mg Oral Daily  . clonazePAM  1 mg Oral Daily  . clopidogrel  75 mg Oral Q breakfast  . enoxaparin  40 mg Subcutaneous Q24H  . hydrOXYzine  50 mg Oral TID  . lamoTRIgine  200 mg Oral BID  . nicotine  21 mg Transdermal Daily  . OLANZapine  15 mg Oral QHS  . thiothixene  2 mg Oral BID   Continuous Infusions:    . sodium chloride 20 mL/hr at 05/06/12 1744    Principal Problem:  *CVA (cerebral infarction) Active Problems:  Hypertension  Schizoaffective disorder, bipolar type  Hallucination  Hyperlipidemia  Tobacco abuse  Depression    Time spent:  > 35 mins    [facility]  [facility] Pager [phone]. If 8PM-8AM, please contact night-coverage at [email or web address], password [facility] 05/07/2012, 2:50 PM  LOS: 3 days

## 2012-05-07 NOTE — Progress Notes (Signed)
Physical Therapy Treatment Patient Details Name: April [name] MRN: [id number] DOB: 1966/05/19 Today's Date: 05/07/2012 Time: 7846-9629 PT Time Calculation (min): 25 min  PT Assessment / Plan / Recommendation Comments on Treatment Session  Pt. ambulated into hallway with RW and min (A) for steadiness. Pt. demonstrated Lt hip hike and slight Lt LE circumduction and when encouraged in typical gt., pt reports that her knee is hurting from where she fell on Tuesday. Pt. standing with mod (A) and not WBing on her Lt LE for fear of pain and educated to try and use the Lt LE during mobility. Continuing to recommend 24hr (A)/supervion post D/C and HHPT.     Follow Up Recommendations  Home health PT;Supervision/Assistance - 24 hour     Does the patient have the potential to tolerate intense rehabilitation     Barriers to Discharge        Equipment Recommendations  None recommended by PT    Recommendations for Other Services    Frequency Min 3X/week   Plan Discharge plan needs to be updated    Precautions / Restrictions Precautions Precautions: Fall Restrictions Weight Bearing Restrictions: No   Pertinent Vitals/Pain Patient reports migraine pain of 8/10 and states she had had medication and would try to ambulate.    Mobility  Bed Mobility Bed Mobility: Supine to Sit;Sitting - Scoot to Edge of Bed Supine to Sit: 6: Modified independent (Device/Increase time);HOB flat Sitting - Scoot to Edge of Bed: 6: Modified independent (Device/Increase time) Details for Bed Mobility Assistance: Pt. given no physical (A) to get to sitting at EOB only VC for waiting for gt. belt to be applied before standing. Transfers Transfers: Sit to Stand;Stand to Sit Sit to Stand: 3: Mod assist;With upper extremity assist;From bed;From chair/3-in-1 Stand to Sit: 4: Min guard;With upper extremity assist;With armrests;To chair/3-in-1 Details for Transfer Assistance: Pt mod (A) for standing and min (A) for sitting  for safety and steadiness and given max VC for proper hand placement when stepping to/sitting from RW. Pt. mod (A) with sit>stand and reported that she was not putting weight on her Lt LE since it was hurting from where she fell the other day. Pt encouraged to even her WB'ing through both LEs during sit>stands Ambulation/Gait Ambulation/Gait Assistance: 4: Min assist Ambulation Distance (Feet): 175 Feet Assistive device: Rolling walker Ambulation/Gait Assistance Details: Pt used RW to ambulate into hallway with min (A) and VC to bend her Lt. knee when pt. demonstrated Lt hip hiking and slight circumduction. Pt. reports that her knee hurts and she does not want to bend it. Pt. given VC for proper hand placement on RW and to stay close to the RW and to relax her shoulders with ambulation. Gait Pattern: Step-through pattern;Decreased stride length;Left hip hike;Left circumduction Gait velocity: decreased Stairs: No Wheelchair Mobility Wheelchair Mobility: No Modified Rankin (Stroke Patients Only) Pre-Morbid Rankin Score: No symptoms Modified Rankin: Moderately severe disability      PT Goals Acute Rehab PT Goals PT Goal Formulation: With patient Time For Goal Achievement: 05/12/12 Potential to Achieve Goals: Good Pt will Ambulate: >150 feet;with modified independence PT Goal: Ambulate - Progress: Progressing toward goal Pt will Go Up / Down Stairs: 3-5 stairs;with modified independence Pt will Perform Home Exercise Program: Independently  Visit Information  Last PT Received On: 05/07/12 Assistance Needed: +1    Subjective Data  Subjective: "I have a clot in my brain" Patient Stated Goal: Return home   Cognition  Overall Cognitive Status: Impaired Area of Impairment:  Safety/judgement;Awareness of deficits Arousal/Alertness: Awake/alert Orientation Level: Appears intact for tasks assessed Behavior During Session: [facility] for tasks performed Safety/Judgement: Decreased awareness of  safety precautions;Decreased safety judgement for tasks assessed Safety/Judgement - Other Comments: Pt. given VC for waiting for SPTA to stand and reason for chair alarm    Balance  Balance Balance Assessed: No  End of Session PT - End of Session Equipment Utilized During Treatment: Gait belt Activity Tolerance: Patient tolerated treatment well Patient left: in chair;with call bell/phone within reach;with chair alarm set Nurse Communication: Mobility status    Ryka Beighley, SPTA 05/07/2012, 1:15 PM

## 2012-05-08 DIAGNOSIS — F1910 Other psychoactive substance abuse, uncomplicated: Secondary | ICD-10-CM

## 2012-05-08 DIAGNOSIS — E875 Hyperkalemia: Secondary | ICD-10-CM

## 2012-05-08 LAB — URINE CULTURE: Colony Count: 30000

## 2012-05-08 LAB — BASIC METABOLIC PANEL
BUN: 13 mg/dL (ref 6–23)
BUN: 13 mg/dL (ref 6–23)
Calcium: 9.4 mg/dL (ref 8.4–10.5)
Chloride: 106 mEq/L (ref 96–112)
Creatinine, Ser: 0.88 mg/dL (ref 0.50–1.10)
GFR calc Af Amer: 89 mL/min — ABNORMAL LOW (ref >90)
GFR calc non Af Amer: 70 mL/min — ABNORMAL LOW (ref >90)
Glucose, Bld: 101 mg/dL — ABNORMAL HIGH (ref 70–99)
Sodium: 139 mEq/L (ref 135–145)

## 2012-05-08 MED ORDER — ONDANSETRON HCL 4 MG PO TABS
4.0000 mg | ORAL_TABLET | Freq: Four times a day (QID) | ORAL | Status: DC | PRN
  Administered 2012-05-08 – 2012-05-09 (×2): 4 mg via ORAL
  Filled 2012-05-08: qty 1
  Filled 2012-05-08: qty 36
  Filled 2012-05-08: qty 1

## 2012-05-08 MED ORDER — METOCLOPRAMIDE HCL 5 MG/ML IJ SOLN
10.0000 mg | Freq: Once | INTRAMUSCULAR | Status: AC
  Administered 2012-05-08: 10 mg via INTRAVENOUS
  Filled 2012-05-08: qty 2

## 2012-05-08 MED ORDER — BUTALBITAL-APAP-CAFFEINE 50-325-40 MG PO TABS
1.0000 | ORAL_TABLET | Freq: Four times a day (QID) | ORAL | Status: DC | PRN
  Administered 2012-05-08 – 2012-05-13 (×8): 1 via ORAL
  Filled 2012-05-08 (×8): qty 1

## 2012-05-08 NOTE — Progress Notes (Signed)
Occupational Therapy Treatment Patient Details Name: April [name] MRN: [id number] DOB: 24-Oct-1965 Today's Date: 05/08/2012 Time: 1191-4782 OT Time Calculation (min): 38 min  OT Assessment / Plan / Recommendation Comments on Treatment Session This 46 yo making progress but still with decreased functional use of LUE and cognitive issues (have asked for SLP consult)    Follow Up Recommendations  CIR       Equipment Recommendations  Tub/shower seat    Recommendations for Other Services Rehab consult  Frequency Min 3X/week   Plan Discharge plan needs to be updated    Precautions / Restrictions Precautions Precautions: Fall Restrictions Weight Bearing Restrictions: No   Pertinent Vitals/Pain headache    ADL  ADL Comments: Pt asked to go to the bathroom when I entered room. She was min guard A for all aspects of toileting in bathroom. Offerred pt to wash up at sink and she was so greatful. Mainly used RUE due to decreased sensation/proprioception in LUE (although good movement).  I kept encouraging her to try and use  her LUE.       OT Goals Acute Rehab OT Goals OT Goal Formulation: With patient Time For Goal Achievement: 05/08/12 Potential to Achieve Goals: Good ADL Goals Pt Will Perform Grooming: with set-up;with supervision;Unsupported;Standing at sink (using LUE as a gross assist) ADL Goal: Grooming - Progress: Goal set today Pt Will Perform Tub/Shower Transfer: Tub transfer;with min assist;Ambulation;with DME;Shower seat with back ADL Goal: Web designer - Progress: Goal set today Arm Goals Arm Goal: Theraband Exercises - Progress: Progressing toward goal Arm Goal: Additional Goal #1 - Progress: Progressing toward goals  Visit Information  Last OT Received On: 05/08/12 Assistance Needed: +1    Subjective Data  Subjective: I am having trouble remembering things      Cognition  Overall Cognitive Status: Impaired Area of Impairment: Memory Arousal/Alertness:  Awake/alert Orientation Level: Disoriented to;Time (thought it was Thursday) Behavior During Session: [facility] for tasks performed Memory Deficits: Kept asking the same questions over and over Safety/Judgement - Other Comments: Forgets she should not get up by herself so chair and bed alarms have to be used with her.    Mobility   Bed Mobility Bed Mobility: Supine to Sit Supine to Sit: 6: Modified independent (Device/Increase time) Transfers Transfers: Sit to Stand;Stand to Sit Sit to Stand: 4: Min assist;With upper extremity assist;From bed Stand to Sit: With upper extremity assist;With armrests;To chair/3-in-1       Exercises  Other Exercises Other Exercises: For GM acitivities with LUE had pt focus on picking up tolietry containers with LUE off of one side of her tray table and then reaching across and placing the items on the opposite side of the tray table. Pt needed Supervision Other Exercises: Gave pt red theraputty and 5 "glass" gems to put in putty to then work on finding and pulling out with her LUE. Also went over sequence of rolling putty into ball, flattenign out into a pancake, rolling into a log, then pinching along the log with thumb and first finger. Pt needed Supervsion for this      End of Session OT - End of Session Equipment Utilized During Treatment:  (RW) Activity Tolerance: Patient tolerated treatment well (She did report that she felt tired at end of session) Patient left: in chair;with call bell/phone within reach;with chair alarm set Nurse Communication:  (Chair alarm in place)       Evette Georges [phone] 05/08/2012, 1:57 PM

## 2012-05-08 NOTE — Progress Notes (Signed)
[facility] PROGRESS NOTE  April [name] QMV:[id number] DOB: January 15, 1966 DOA: 05/04/2012 PCP: No primary provider on file.  Assessment/Plan: #1 acute nonhemorrhagic right hemispheric infarcts involving portions of the right opercular region, right subinsular region, right frontal lobe, right parietal lobe, Likely embolic in nature. Carotid Dopplers negative for ICA stenosis. 2-D echo is negative for any source of emboli. TEE is negative for thrombus. No PFO or ASD. No source of emboli. Continue aspirin and Plavix x30 days per neurology and then full dose aspirin. Continue Lipitor. Risk factor modification. Tobacco cessation. PT OT. Neurology following and appreciate input and recommendations.  #2 auditory and visual hallucinations Patient does have a history of schizoaffective disorder and bipolar disorder. Patient states had stopped taking her psychiatric medications of a month ago. Patient is on Zyprexa, Lamictal, NAVANE, Celexa. Patient states STILL having visual hallucinations. Patient denies any suicidal or homicidal ideations. Psych FF. D/C navane.  #3 hyperlipidemia Lipitor.  #4 schizoaffective disorder/bipolar disorder Patient is having visual hallucinations. Patient states hasn't taken her medications in a month. Will continue his Zyprexa, Celexa, Lamictal . D/C navane. Psychiatry FF.   #5 hyperkalemia Repeat BMET. If potassium is still elevated will check a EKG and give Kayexalate.  #5 tobacco abuse Nicotine patch. Tobacco cessation.  Code Status: Full Family Communication: Updated patient. No family at bedside Disposition Plan: Home when medically stable.   Consultants:  Neurology: Dr. Roseanne Reno 05/04/2012  Psychiatry Dr. Ferol Luz 05/07/2012  Procedures:  CT head 05/04/2012  MRI head 05/04/2012  2-D echo 05/05/2012  Carotid Dopplers 05/05/2012  TEE 05/06/2012  Antibiotics:  None  HPI/Subjective: Patient laying in bed in the dark with towel on head. Patient  states nausea and emesis today. C/o HA. Patient states still with visual hallucinations.  Objective: Filed Vitals:   05/07/12 2158 05/08/12 0554 05/08/12 1042 05/08/12 1436  BP: 118/76 122/78 131/75 142/78  Pulse: 89 84 99 79  Temp: 98.2 F (36.8 C) 98.2 F (36.8 C) 98 F (36.7 C) 98.2 F (36.8 C)  TempSrc:   Oral Oral  Resp: 16 16 16 16   Height:      Weight:      SpO2: 97% 100% 100% 100%    Intake/Output Summary (Last 24 hours) at 05/08/12 1712 Last data filed at 05/08/12 0630  Gross per 24 hour  Intake      1 ml  Output      0 ml  Net      1 ml   Filed Weights   05/05/12 2052  Weight: 146.33 kg (322 lb 9.6 oz)    Exam:   General:  NAD. Patient laying in dark with towel on head.  Cardiovascular: rrr  Respiratory: ctab  Abdomen: Soft/ NT/ND/+BS  Data Reviewed: Basic Metabolic Panel:  Lab 05/08/12 2841 05/07/12 0545 05/04/12 2137 05/04/12 1756  NA 140 139 -- 139  K 5.5* 4.3 -- 3.6  CL 106 104 -- 104  CO2 22 26 -- --  GLUCOSE 100* 95 -- 99  BUN 13 13 -- 5*  CREATININE 0.88 1.07 0.79 0.80  CALCIUM 9.1 8.9 -- --  MG -- -- -- --  PHOS -- -- -- --   Liver Function Tests: No results found for this basename: AST:5,ALT:5,ALKPHOS:5,BILITOT:5,PROT:5,ALBUMIN:5 in the last 168 hours No results found for this basename: LIPASE:5,AMYLASE:5 in the last 168 hours No results found for this basename: AMMONIA:5 in the last 168 hours CBC:  Lab 05/07/12 0545 05/04/12 2137 05/04/12 1756 05/04/12 1733  WBC 7.3 9.0 -- 8.1  NEUTROABS -- -- -- 4.1  HGB 11.8* 13.1 14.3 13.0  HCT 36.5 39.3 42.0 39.4  MCV 90.1 89.1 -- 89.5  PLT 268 311 -- 297   Cardiac Enzymes: No results found for this basename: CKTOTAL:5,CKMB:5,CKMBINDEX:5,TROPONINI:5 in the last 168 hours BNP (last 3 results) No results found for this basename: PROBNP:3 in the last 8760 hours CBG: No results found for this basename: GLUCAP:5 in the last 168 hours  Recent Results (from the past 240 hour(s))  URINE  CULTURE     Status: Normal   Collection Time   05/06/12  8:30 PM      Component Value Range Status Comment   Specimen Description URINE, CLEAN CATCH   Final    Special Requests NONE   Final    Culture  Setup Time 05/06/2012 20:54   Final    Colony Count 30,000 COLONIES/ML   Final    Culture     Final    Value: Multiple bacterial morphotypes present, none predominant. Suggest appropriate recollection if clinically indicated.   Report Status 05/08/2012 FINAL   Final      Studies: No results found.  Scheduled Meds:    . aspirin  81 mg Oral Daily  . atorvastatin  20 mg Oral q1800  . citalopram  30 mg Oral Daily  . clonazePAM  1 mg Oral Daily  . clopidogrel  75 mg Oral Q breakfast  . enoxaparin  40 mg Subcutaneous Q24H  . hydrOXYzine  50 mg Oral TID  . lamoTRIgine  200 mg Oral BID  . nicotine  21 mg Transdermal Daily  . OLANZapine  15 mg Oral QHS  . [DISCONTINUED] thiothixene  2 mg Oral BID   Continuous Infusions:    . sodium chloride 20 mL/hr at 05/06/12 1744    Principal Problem:  *CVA (cerebral infarction) Active Problems:  Hypertension  Schizoaffective disorder, bipolar type  Hallucination  Hyperlipidemia  Tobacco abuse  Depression  Hyperkalemia  Headache    Time spent:  > 35 mins    [facility]  [facility] Pager [phone]. If 8PM-8AM, please contact night-coverage at [email or web address], password [email or web address] 05/08/2012, 5:12 PM  LOS: 4 days

## 2012-05-08 NOTE — Progress Notes (Signed)
PT Cancellation Note  Patient Details Name: April [name] MRN: [id number] DOB: 1966-04-27   Cancelled Treatment:    Reason Eval/Treat Not Completed: Medical issues which prohibited therapy (pt with nausea and vommiting.) Will attempt treatment next session.    Milana Kidney 05/08/2012, 4:08 PM

## 2012-05-08 NOTE — Progress Notes (Signed)
Progress Notes Patient Identification:  April [name] Date of Evaluation:  05/08/2012 Reason for Consult:  Auditory, Visual Hallucinations; s/p CVA  Referring Provider: Dr. Janee Morn  History of Present Illness:pt came to [facility] ED after experiencing prodromal tingling on face and falling.  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  She could not get to her appointment due to nausea and vomiting.   Past Medical History:     Past Medical History  Diagnosis Date  . Hypertension   . Bipolar 1 disorder   . Schizo affective schizophrenia   . Depression, major   . Hyperlipidemia 05/06/2012  . Tobacco abuse 05/06/2012  . Depression 05/06/2012       Past Surgical History  Procedure Date  . Cholecystectomy   . Abdominal hysterectomy   . Tee without cardioversion 05/06/2012    Procedure: TRANSESOPHAGEAL ECHOCARDIOGRAM (TEE);  Surgeon: Laurey Morale, MD;  Location: [facility] ENDOSCOPY;  Service: Cardiovascular;  Laterality: N/A;    Allergies:  Allergies  Allergen Reactions  . Morphine And Related Hives and Itching    Current Medications:  Prior to Admission medications   Medication Sig Start Date End Date Taking? Authorizing Provider  citalopram (CELEXA) 20 MG tablet Take 30 mg by mouth daily.   Yes Historical Provider, MD  clonazePAM (KLONOPIN) 1 MG tablet Take 1.5 mg by mouth daily. Scheduled dose   Yes Historical Provider, MD  cloNIDine (CATAPRES) 0.1 MG tablet Take 0.1 mg by mouth 2 (two) times daily.   Yes Historical Provider, MD  hydrOXYzine (ATARAX/VISTARIL) 50 MG tablet Take 50 mg by mouth 3 (three) times daily. Scheduled doses   Yes Historical Provider, MD  lamoTRIgine (LAMICTAL) 200 MG tablet Take 200 mg by mouth 2  (two) times daily.   Yes Historical Provider, MD  lithium carbonate 300 MG capsule Take 300 mg by mouth 2 (two) times daily with a meal.   Yes Historical Provider, MD  OLANZapine (ZYPREXA) 15 MG tablet Take 15 mg by mouth at bedtime.   Yes Historical Provider, MD  thiothixene (NAVANE) 2 MG capsule Take 2 mg by mouth 2 (two) times daily.   Yes Historical Provider, MD    Social History:    reports that she has been smoking.  She does not have any smokeless tobacco history on file. She reports that she drinks alcohol. She reports that she uses illicit drugs (Marijuana).   Family History:    History reviewed. No pertinent family history.  Mental Status Examination/Evaluation: Objective:  Appearance: Casual and appropriate but has a migraine headache  Eye Contact::  Good  Speech:  Clear and Coherent and Normal Rate  Volume:  Normal  Mood:  Distressed, in pain  Affect:  Appropriate  Thought Process:  Coherent, Goal Directed and Logical  Orientation:  Full (Time, Place, and Person)  Thought Content:  focused on getting better, getting her medications [at steady state]  Suicidal Thoughts:  No  Homicidal Thoughts:  No  Judgement:  Fair  Insight:  Good   DIAGNOSIS:   AXIS I   Schizoaffective Disorder, Bipolar type,  AXIS II  Deferred  AXIS III See medical notes.  AXIS IV economic problems, other psychosocial or environmental problems, problems related to social environment and barriers to adherence to following treatment regimen  PT required to recover from CVA  AXIS V 51-60 moderate symptoms   Assessment/Plan: MRA revealed internal carotid arteries with occlusion.  Concern is raised today for the occurrence of  The migraine headache s/p CVA.  Suggest further evaluation of Cerebral integrity of arteries.  Migraine  Is associated with vascular changes.  Her medications for Schizoaffective Disorder have been restarted.   Despite a CVA.  She is cognitively intact and is able to logically  discuss her past history and present needs.  She is not suicidal.  She is calm.  Language and concentration are intact.  She recognizes that she needs PT RECOMMENDATION:  1.  Suggest, concern for headache following CVA. 2.  Pt has capacity to cooperate in her treatment and rehab. 3.  Suggest transfer to SNF for necessary rehabilitation sp CVA. 4.  Continue Psychiatric medications; appreciate simplification to second generation antipsychotic , dropping first generation antipsychotic.  5.  No further psychiatric needs identified.  MD Psychiatrist signs off. Mickeal Skinner MD 05/08/2012 2:09 PM

## 2012-05-08 NOTE — Progress Notes (Signed)
05/08/2012 1620 NCM spoke to pt and she did receive her RW. OT is recommending SLP, will await order if MD agrees. Isidoro Donning RN CCM Case Mgmt phone [phone]

## 2012-05-09 DIAGNOSIS — R51 Headache: Secondary | ICD-10-CM

## 2012-05-09 DIAGNOSIS — F329 Major depressive disorder, single episode, unspecified: Secondary | ICD-10-CM

## 2012-05-09 LAB — BASIC METABOLIC PANEL
CO2: 27 mEq/L (ref 19–32)
Calcium: 9.6 mg/dL (ref 8.4–10.5)
Creatinine, Ser: 0.99 mg/dL (ref 0.50–1.10)
Glucose, Bld: 104 mg/dL — ABNORMAL HIGH (ref 70–99)

## 2012-05-09 MED ORDER — VALPROATE SODIUM 500 MG/5ML IV SOLN
500.0000 mg | Freq: Once | INTRAVENOUS | Status: AC
  Administered 2012-05-09: 500 mg via INTRAVENOUS
  Filled 2012-05-09: qty 5

## 2012-05-09 NOTE — Progress Notes (Signed)
[facility] PROGRESS NOTE  April [name] OZH:[id number] DOB: 05/06/1966 DOA: 05/04/2012 PCP: No primary provider on file.  Assessment/Plan: #1 acute nonhemorrhagic right hemispheric infarcts involving portions of the right opercular region, right subinsular region, right frontal lobe, right parietal lobe, Likely embolic in nature. Carotid Dopplers negative for ICA stenosis. 2-D echo is negative for any source of emboli. TEE is negative for thrombus. No PFO or ASD. No source of emboli. Continue aspirin and Plavix x30 days per neurology and then full dose aspirin. Continue Lipitor. Risk factor modification. Tobacco cessation. PT/ OT. Neurology following and appreciate input and recommendations.  #2 auditory and visual hallucinations Patient does have a history of schizoaffective disorder and bipolar disorder. Patient states had stopped taking her psychiatric medications of a month ago. Patient is on Zyprexa, Lamictal, Celexa. Patient states STILL having visual hallucinations. Patient denies any suicidal or homicidal ideations. Psych FF.   #3 hyperlipidemia Lipitor.  #4 schizoaffective disorder/bipolar disorder Patient is still having visual hallucinations. Patient states hasn't taken her medications in a month. Will continue his Zyprexa, Celexa, Lamictal . Psychiatry FF.   #5 hyperkalemia Resolved.  #5 tobacco abuse Nicotine patch. Tobacco cessation.  #6 headache Likely a migrainous headache versus post CVA headache. Will give a dose of IV Depakote x1 today. Follow.  Code Status: Full Family Communication: Updated patient. No family at bedside Disposition Plan: CIR versus Home when medically stable.   Consultants:  Neurology: Dr. Roseanne Reno 05/04/2012  Psychiatry Dr. Ferol Luz 05/07/2012  Procedures:  CT head 05/04/2012  MRI head 05/04/2012  2-D echo 05/05/2012  Carotid Dopplers 05/05/2012  TEE 05/06/2012  Antibiotics:  None  HPI/Subjective: Patient laying in bed in  the dark with towel on head. C/o HA. Patient states still with visual hallucinations. Patient stated that had some mood swings today.  Objective: Filed Vitals:   05/08/12 2144 05/09/12 0200 05/09/12 0600 05/09/12 1025  BP: 119/70 112/66 126/77 131/76  Pulse: 102 82 80 81  Temp: 97.9 F (36.6 C) 97.8 F (36.6 C) 98.2 F (36.8 C) 98.3 F (36.8 C)  TempSrc:    Oral  Resp: 18 18 18 18   Height:      Weight:      SpO2: 97% 97% 98% 99%   No intake or output data in the 24 hours ending 05/09/12 1326 Filed Weights   05/05/12 2052  Weight: 146.33 kg (322 lb 9.6 oz)    Exam:   General:  NAD. Patient laying in dark with towel on head.  Cardiovascular: rrr  Respiratory: ctab  Abdomen: Soft/ NT/ND/+BS  Data Reviewed: Basic Metabolic Panel:  Lab 05/09/12 6295 05/08/12 1744 05/08/12 0642 05/07/12 0545 05/04/12 2137 05/04/12 1756  NA 139 139 140 139 -- 139  K 4.4 3.7 5.5* 4.3 -- 3.6  CL 101 103 106 104 -- 104  CO2 27 27 22 26  -- --  GLUCOSE 104* 101* 100* 95 -- 99  BUN 12 13 13 13  -- 5*  CREATININE 0.99 0.95 0.88 1.07 0.79 --  CALCIUM 9.6 9.4 9.1 8.9 -- --  MG -- -- -- -- -- --  PHOS -- -- -- -- -- --   Liver Function Tests: No results found for this basename: AST:5,ALT:5,ALKPHOS:5,BILITOT:5,PROT:5,ALBUMIN:5 in the last 168 hours No results found for this basename: LIPASE:5,AMYLASE:5 in the last 168 hours No results found for this basename: AMMONIA:5 in the last 168 hours CBC:  Lab 05/07/12 0545 05/04/12 2137 05/04/12 1756 05/04/12 1733  WBC 7.3 9.0 -- 8.1  NEUTROABS -- -- --  4.1  HGB 11.8* 13.1 14.3 13.0  HCT 36.5 39.3 42.0 39.4  MCV 90.1 89.1 -- 89.5  PLT 268 311 -- 297   Cardiac Enzymes: No results found for this basename: CKTOTAL:5,CKMB:5,CKMBINDEX:5,TROPONINI:5 in the last 168 hours BNP (last 3 results) No results found for this basename: PROBNP:3 in the last 8760 hours CBG: No results found for this basename: GLUCAP:5 in the last 168 hours  Recent Results  (from the past 240 hour(s))  URINE CULTURE     Status: Normal   Collection Time   05/06/12  8:30 PM      Component Value Range Status Comment   Specimen Description URINE, CLEAN CATCH   Final    Special Requests NONE   Final    Culture  Setup Time 05/06/2012 20:54   Final    Colony Count 30,000 COLONIES/ML   Final    Culture     Final    Value: Multiple bacterial morphotypes present, none predominant. Suggest appropriate recollection if clinically indicated.   Report Status 05/08/2012 FINAL   Final      Studies: No results found.  Scheduled Meds:    . aspirin  81 mg Oral Daily  . atorvastatin  20 mg Oral q1800  . citalopram  30 mg Oral Daily  . clonazePAM  1 mg Oral Daily  . clopidogrel  75 mg Oral Q breakfast  . enoxaparin  40 mg Subcutaneous Q24H  . hydrOXYzine  50 mg Oral TID  . lamoTRIgine  200 mg Oral BID  . [COMPLETED] metoCLOPramide (REGLAN) injection  10 mg Intravenous Once  . nicotine  21 mg Transdermal Daily  . OLANZapine  15 mg Oral QHS  . valproate sodium  500 mg Intravenous Once  . [DISCONTINUED] thiothixene  2 mg Oral BID   Continuous Infusions:    . [DISCONTINUED] sodium chloride 20 mL/hr at 05/06/12 1744    Principal Problem:  *CVA (cerebral infarction) Active Problems:  Hypertension  Schizoaffective disorder, bipolar type  Hallucination  Hyperlipidemia  Tobacco abuse  Depression  Hyperkalemia  Headache    Time spent:  > 35 mins    [facility]  [facility] Pager [phone]. If 8PM-8AM, please contact night-coverage at [email or web address], password [place] 05/09/2012, 1:26 PM  LOS: 5 days

## 2012-05-09 NOTE — Consult Note (Signed)
Patient Identification:  April [name] Date of Evaluation:  05/09/2012 Reason for Consult:  Auditory/visual Hallucinations, s/p CVA  Referring Provider: unknown  History of Present Illness:   This is a 46 year old female who came after she kept dropping things from her left hand. The patient has been  off her psych medications approximately a month. . She does have hypertension but she states she did continue take her blood pressure medications. Was seen by psychiatry before here. Per primary team she continue to have AVH now and stressed out about it. She admitted that today. Currently her headache is not under control and not able to give detailed hx due to sever headache. Thinks she is also depressed right. psy meds seems help now. Also have trouble sleeping.   Past Psychiatric   She has diagnosis of Schizoaffective disorder, bipolar type.  She has no funds to buy her psych medication:  Celexa 30 mg, Klonopin 1 mg, Lamictal 200 mg, Zyprexa 1 mg, Navane 2 mg.   Past Medical History:     Past Medical History  Diagnosis Date  . Hypertension   . Bipolar 1 disorder   . Schizo affective schizophrenia   . Depression, major   . Hyperlipidemia 05/06/2012  . Tobacco abuse 05/06/2012  . Depression 05/06/2012       Past Surgical History  Procedure Date  . Cholecystectomy   . Abdominal hysterectomy   . Tee without cardioversion 05/06/2012    Procedure: TRANSESOPHAGEAL ECHOCARDIOGRAM (TEE);  Surgeon: Laurey Morale, MD;  Location: [facility] ENDOSCOPY;  Service: Cardiovascular;  Laterality: N/A;    Allergies:  Allergies  Allergen Reactions  . Morphine And Related Hives and Itching    Current Medications:  Prior to Admission medications   Medication Sig Start Date End Date Taking? Authorizing Provider  citalopram (CELEXA) 20 MG tablet Take 30 mg by mouth daily.   Yes Historical Provider, MD  clonazePAM (KLONOPIN) 1 MG tablet Take 1.5 mg by mouth daily. Scheduled dose   Yes Historical Provider,  MD  cloNIDine (CATAPRES) 0.1 MG tablet Take 0.1 mg by mouth 2 (two) times daily.   Yes Historical Provider, MD  hydrOXYzine (ATARAX/VISTARIL) 50 MG tablet Take 50 mg by mouth 3 (three) times daily. Scheduled doses   Yes Historical Provider, MD  lamoTRIgine (LAMICTAL) 200 MG tablet Take 200 mg by mouth 2 (two) times daily.   Yes Historical Provider, MD  lithium carbonate 300 MG capsule Take 300 mg by mouth 2 (two) times daily with a meal.   Yes Historical Provider, MD  OLANZapine (ZYPREXA) 15 MG tablet Take 15 mg by mouth at bedtime.   Yes Historical Provider, MD  thiothixene (NAVANE) 2 MG capsule Take 2 mg by mouth 2 (two) times daily.   Yes Historical Provider, MD    Social History:    reports that she has been smoking.  She does not have any smokeless tobacco history on file. She reports that she drinks alcohol. She reports that she uses illicit drugs (Marijuana).   Family History:    History reviewed. No pertinent family history.  Mental Status Examination/Evaluation: Objective:  Appearance: Casual  Obese  Eye Contact::  Good  Speech:  Clear and Coherent and Normal Rate  Volume:  Normal  Mood:  depressed  Affect:  Appropriate, Congruent and worried about loss of appetite and CVA  Thought Process:  Coherent and Logical  Orientation:  Full (Time, Place, and Person)  Thought Content:  concern re; residuzl effects of CVA nad lack of  medications  Suicidal Thoughts:  No  Homicidal Thoughts:  No  Judgement:  Fair  Insight:  Fair   DIAGNOSIS:   AXIS I  Schizoaffective DO, bipolar type;   AXIS II  Deferred  AXIS III See medical notes.  AXIS IV economic problems, other psychosocial or environmental problems, problems related to social environment, problems with primary support group and inability to support sefl and medical needs  AXIS V 51-60 moderate symptoms    RECOMMENDATION:   1.  Recomedn Depkoate level before increasing it. also TSh  2. Increase celexa to 40 mg QAm for  mood  3. Trazadone 5--100 mg qhs for sleep    4. Increase Zyprexa to 20 mg for AVH  5.  Will follow pt. As needed      ROS   Physical Exam

## 2012-05-09 NOTE — Progress Notes (Signed)
PT Cancellation Note  Patient Details Name: April [name] MRN: [id number] DOB: [birth year]   Cancelled Treatment:    Reason Eval/Treat Not Completed: Medical issues which prohibited therapy (back pain and nausea) Pt educated on importance of OOB mobility. Patient aware and reports "I've been squeezing my ball."   Nolawi Kanady, Becky Sax 05/09/2012, 3:26 PM  Lewis Shock, PT, DPT Pager #: [phone] Office #: [phone]

## 2012-05-09 NOTE — Progress Notes (Signed)
Occupational Therapy Treatment Patient Details Name: April [name] MRN: [id number] DOB: 10-11-1965 Today's Date: 05/09/2012 Time: 7829-5621 OT Time Calculation (min): 23 min  OT Assessment / Plan / Recommendation Comments on Treatment Session Making steady progress. Educated pt on increasing attention to L UE during functional acitivites by using vision and working in [employer].Pt appears to over exagerate her symptoms at times. Pt appropriate for DC homo with 24/7 S and HHOT.    Follow Up Recommendations  Home health OT    Barriers to Discharge   none    Equipment Recommendations       Recommendations for Other Services  none  Frequency Min 3X/week   Plan Discharge plan remains appropriate    Precautions / Restrictions Precautions Precautions: Fall Restrictions Weight Bearing Restrictions: No   Pertinent Vitals/Pain No c/o pain    ADL  Eating/Feeding: Independent;Other (comment) (pt stated she has difficulty opening packkages) Where Assessed - Eating/Feeding: Chair ADL Comments: focus of session on FM/GM coordination acitivties. Pt educated on additonal theraputty exercies to address pinch strength. Educated pt on BUE integrated tasks and activities to address apparent motor impersistence.    OT Diagnosis:    OT Problem List:   OT Treatment Interventions:     OT Goals Acute Rehab OT Goals Time For Goal Achievement: 05/08/12 Potential to Achieve Goals: Good ADL Goals Pt Will Perform Grooming: with set-up;with supervision;Unsupported;Standing at sink ADL Goal: Grooming - Progress: Met Pt Will Perform Tub/Shower Transfer: Tub transfer;with min assist;Ambulation;with DME;Shower seat with back ADL Goal: Web designer - Progress: Progressing toward goals Arm Goals Pt Will Complete Theraband Exer: Independently;Bilateral upper extremities;10 reps;2 sets;Level 2 Theraband Arm Goal: Theraband Exercises - Progress: Progressing toward goal Additional Arm  Goal #1: Pt will peform exercise with theraputty for L hand to increase strength in preparation for increased  I with ADL activity I ly Arm Goal: Additional Goal #1 - Progress: Progressing toward goals  Visit Information  Last OT Received On: 05/09/12 Assistance Needed: +1    Subjective Data      Prior Functioning       Cognition  Overall Cognitive Status: Impaired Area of Impairment: Memory Arousal/Alertness: Awake/alert Orientation Level: Disoriented to;Time Behavior During Session: WFL for tasks performed Memory: Decreased recall of precautions Cognition - Other Comments: Feel cognition is most likely baseline (Has schitzoaffective disorder, bipolar and )    Mobility  Shoulder Instructions         Exercises  Other Exercises Other Exercises: Fm/GM acticities. BUE integratetd tasks. UE strengthening Other Exercises: . Educated pt to use vision to compensate for sensory loss LUE.   Balance     End of Session OT - End of Session Activity Tolerance: Patient tolerated treatment well Patient left: in chair;with call bell/phone within reach;with family/visitor present Nurse Communication: Mobility status  GO     Mills Mitton,HILLARY 05/09/2012, 5:26 PM [facility], OTR/L  [phone] 05/09/2012

## 2012-05-09 NOTE — Evaluation (Signed)
Speech Language Pathology Evaluation Patient Details Name: April [name] MRN: [id number] DOB: 1965/07/15 Today's Date: 05/09/2012 Time: 4098-1191 SLP Time Calculation (min): 12 min  Problem List:  Patient Active Problem List  Diagnosis  . Hypertension  . CVA (cerebral infarction)  . Schizoaffective disorder, bipolar type  . Hallucination  . Hyperlipidemia  . Tobacco abuse  . Depression  . Hyperkalemia  . Headache   Past Medical History:  Past Medical History  Diagnosis Date  . Hypertension   . Bipolar 1 disorder   . Schizo affective schizophrenia   . Depression, major   . Hyperlipidemia 05/06/2012  . Tobacco abuse 05/06/2012  . Depression 05/06/2012   Past Surgical History:  Past Surgical History  Procedure Date  . Cholecystectomy   . Abdominal hysterectomy   . Tee without cardioversion 05/06/2012    Procedure: TRANSESOPHAGEAL ECHOCARDIOGRAM (TEE);  Surgeon: Laurey Morale, MD;  Location: [facility] ENDOSCOPY;  Service: Cardiovascular;  Laterality: N/A;   HPI:  46 y.o. female admitted with acute nonhemorrhagic right hemispheric infarcts involving portions of the right opercular region, right subinsular region, right frontal lobe, right parietal lobe. Pt with history of schizoaffective disorder and bipolar disorder.  Is on disability; lives with two friends, spends her days watching television and has "no responsibilities" according to pt.     Assessment / Plan / Recommendation Clinical Impression  Pt presents with moderate cognitive deficits, likely overlying baseline difficulties with attention and impulsivity according to pt.  Presents with impaired selective attention,  working memory, executive functioning with decreased impulse control.  Pt would benefit from skilled SLP services while in acute care.  OT recommends CIR consult; agree pt may benefit.  Pt will need 24 hour supervision post-acute care given cognitive deficits.     SLP Assessment  Patient needs continued Speech  Language Pathology Services    Follow Up Recommendations  Inpatient Rehab    Frequency and Duration min 2x/week  2 weeks   Pertinent Vitals/Pain Premedicated for HA   SLP Goals  SLP Goals Potential to Achieve Goals: Good SLP Goal #1: Pt will problem-solve through complex verbal/written tasks with mod assist. SLP Goal #2: Pt will demonstrate/verbalize anticipatory awareness with min cues.  SLP Evaluation Prior Functioning  Cognitive/Linguistic Baseline: Within functional limits Type of Home: House Lives With: Friend(s) Available Help at Discharge: Friend(s);Available 24 hours/day Vocation: On disability   Cognition  Overall Cognitive Status: Impaired Arousal/Alertness: Awake/alert Orientation Level: Oriented X4 Attention: Selective Selective Attention: Impaired Selective Attention Impairment: Verbal basic;Verbal complex Memory: Impaired Memory Impairment: Storage deficit;Retrieval deficit;Decreased short term memory Awareness: Impaired Awareness Impairment: Emergent impairment Problem Solving: Impaired Problem Solving Impairment: Verbal basic;Functional basic Executive Function: Organizing;Decision Making;Self Monitoring Decision Making: Impaired Decision Making Impairment: Verbal basic;Verbal complex Self Monitoring: Impaired Self Monitoring Impairment: Verbal basic;Verbal complex Behaviors: Impulsive;Poor frustration tolerance Safety/Judgment: Impaired    Comprehension  Auditory Comprehension Overall Auditory Comprehension: Appears within functional limits for tasks assessed Visual Recognition/Discrimination Discrimination: Within Function Limits Reading Comprehension Reading Status: Within funtional limits    Expression Expression Primary Mode of Expression: Verbal Verbal Expression Overall Verbal Expression: Appears within functional limits for tasks assessed Written Expression Dominant Hand: Right   Oral / Motor Oral Motor/Sensory Function Overall Oral  Motor/Sensory Function: Appears within functional limits for tasks assessed Motor Speech Overall Motor Speech: Appears within functional limits for tasks assessed   GO    April [name] L. April [name], [place] CCC/SLP Pager [phone] [name] April [name] 05/09/2012, 11:58 AM

## 2012-05-10 LAB — BASIC METABOLIC PANEL
BUN: 10 mg/dL (ref 6–23)
CO2: 29 mEq/L (ref 19–32)
Calcium: 9.7 mg/dL (ref 8.4–10.5)
Creatinine, Ser: 1.09 mg/dL (ref 0.50–1.10)
GFR calc non Af Amer: 59 mL/min — ABNORMAL LOW (ref >90)
Glucose, Bld: 129 mg/dL — ABNORMAL HIGH (ref 70–99)

## 2012-05-10 MED ORDER — METOCLOPRAMIDE HCL 5 MG/ML IJ SOLN
10.0000 mg | Freq: Once | INTRAMUSCULAR | Status: AC
  Administered 2012-05-10: 10 mg via INTRAVENOUS
  Filled 2012-05-10: qty 2

## 2012-05-10 MED ORDER — CITALOPRAM HYDROBROMIDE 40 MG PO TABS
40.0000 mg | ORAL_TABLET | Freq: Every day | ORAL | Status: DC
  Administered 2012-05-10 – 2012-05-14 (×5): 40 mg via ORAL
  Filled 2012-05-10 (×5): qty 1

## 2012-05-10 MED ORDER — TRAZODONE HCL 50 MG PO TABS
50.0000 mg | ORAL_TABLET | Freq: Every evening | ORAL | Status: DC | PRN
  Administered 2012-05-10: 100 mg via ORAL
  Filled 2012-05-10: qty 2

## 2012-05-10 MED ORDER — PROMETHAZINE HCL 25 MG/ML IJ SOLN
12.5000 mg | Freq: Four times a day (QID) | INTRAMUSCULAR | Status: DC | PRN
  Administered 2012-05-10: 12.5 mg via INTRAVENOUS
  Administered 2012-05-10 – 2012-05-11 (×3): 25 mg via INTRAVENOUS
  Administered 2012-05-11: 12.5 mg via INTRAVENOUS
  Administered 2012-05-12: 25 mg via INTRAVENOUS
  Filled 2012-05-10 (×6): qty 1

## 2012-05-10 MED ORDER — PROCHLORPERAZINE EDISYLATE 5 MG/ML IJ SOLN
10.0000 mg | Freq: Once | INTRAMUSCULAR | Status: AC
  Administered 2012-05-10: 10 mg via INTRAVENOUS
  Filled 2012-05-10: qty 2

## 2012-05-10 MED ORDER — DEXAMETHASONE SODIUM PHOSPHATE 4 MG/ML IJ SOLN
4.0000 mg | Freq: Once | INTRAMUSCULAR | Status: AC
  Administered 2012-05-10: 4 mg via INTRAVENOUS
  Filled 2012-05-10: qty 1

## 2012-05-10 MED ORDER — OLANZAPINE 10 MG PO TABS
20.0000 mg | ORAL_TABLET | Freq: Every day | ORAL | Status: DC
  Administered 2012-05-10: 20 mg via ORAL
  Filled 2012-05-10 (×2): qty 2

## 2012-05-10 MED ORDER — ACETAMINOPHEN 325 MG PO TABS
650.0000 mg | ORAL_TABLET | ORAL | Status: DC | PRN
  Administered 2012-05-13 (×2): 650 mg via ORAL
  Filled 2012-05-10 (×2): qty 2

## 2012-05-10 MED ORDER — TRAZODONE HCL 50 MG PO TABS
50.0000 mg | ORAL_TABLET | Freq: Every evening | ORAL | Status: DC | PRN
  Filled 2012-05-10: qty 1

## 2012-05-10 NOTE — Progress Notes (Signed)
Physical Therapy Treatment Patient Details Name: April [name] MRN: [id number] DOB: 06/17/1965 Today's Date: 05/10/2012 Time: 4098-1191 PT Time Calculation (min): 16 min  PT Assessment / Plan / Recommendation Comments on Treatment Session  Pt requiring less assistance this session, still with significant L sided weakness increasing fall risk. Pt able to complete stairs with assist of RW and for stability. Discussed 24 hr assist upon d/c home for safety with HHPT    Follow Up Recommendations  Home health PT;Supervision/Assistance - 24 hour     Does the patient have the potential to tolerate intense rehabilitation     Barriers to Discharge        Equipment Recommendations  Rolling walker with 5" wheels;Tub/shower seat    Recommendations for Other Services    Frequency Min 3X/week   Plan Discharge plan remains appropriate;Frequency remains appropriate    Precautions / Restrictions Precautions Precautions: Fall Restrictions Weight Bearing Restrictions: No   Pertinent Vitals/Pain No complaints of pain.     Mobility  Bed Mobility Bed Mobility: Supine to Sit Supine to Sit: 6: Modified independent (Device/Increase time) Transfers Transfers: Sit to Stand;Stand to Sit Sit to Stand: 4: Min assist;With upper extremity assist;From bed Stand to Sit: 4: Min assist;With upper extremity assist;To chair/3-in-1 Details for Transfer Assistance: Min assist for stability during sit/stand. Pt impulsive with movements even though she described her leg as "completed asleep" Ambulation/Gait Ambulation/Gait Assistance: 4: Min guard Ambulation Distance (Feet): 200 Feet Assistive device: Rolling walker Ambulation/Gait Assistance Details: Pt ambulated with decreased support of LLE, increased use of UEs. improvements with increased distance Gait Pattern: Step-through pattern;Decreased stride length;Left hip hike;Left circumduction Gait velocity: decreased Stairs: Yes Stairs Assistance: 4: Min  assist Stairs Assistance Details (indicate cue type and reason): Instructed pt on safe technique using RW backwrds. min assist for stability.  Stair Management Technique: No rails;Backwards;Step to pattern;With walker Number of Stairs: 3     Exercises General Exercises - Lower Extremity Ankle Circles/Pumps: AAROM;Strengthening;Left;10 reps;Supine Hip ABduction/ADduction: AAROM;Strengthening;10 reps;Left;Supine Straight Leg Raises: AAROM;Strengthening;Left;10 reps;Supine   PT Diagnosis:    PT Problem List:   PT Treatment Interventions:     PT Goals Acute Rehab PT Goals PT Goal Formulation: With patient PT Goal: Ambulate - Progress: Progressing toward goal PT Goal: Up/Down Stairs - Progress: Progressing toward goal PT Goal: Perform Home Exercise Program - Progress: Progressing toward goal  Visit Information  Last PT Received On: 05/10/12 Assistance Needed: +1    Subjective Data      Cognition  Overall Cognitive Status: Impaired Area of Impairment: Memory Arousal/Alertness: Awake/alert Orientation Level: Disoriented to;Time Behavior During Session: WFL for tasks performed Memory: Decreased recall of precautions    Balance     End of Session PT - End of Session Equipment Utilized During Treatment: Gait belt Activity Tolerance: Patient tolerated treatment well Patient left: in chair;with call bell/phone within reach;with chair alarm set Nurse Communication: Mobility status   GP     Milana Kidney 05/10/2012, 12:32 PM

## 2012-05-10 NOTE — Progress Notes (Signed)
Patient continue to hallucinate this evening,saying"i heard someone flushing my commode, and i asked who he is , but there was no answer" tried to reassure patient but will not calm down, all that she wanted is to tell the doctor to give her something to calm her or else she will leave and go home. Psyc saw her earlier today but was doing better then. MD aware. No orders at this time. Will continue to monitor
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
## 2012-05-11 LAB — BASIC METABOLIC PANEL
BUN: 13 mg/dL (ref 6–23)
Creatinine, Ser: 0.98 mg/dL (ref 0.50–1.10)
GFR calc Af Amer: 78 mL/min — ABNORMAL LOW (ref >90)
GFR calc non Af Amer: 68 mL/min — ABNORMAL LOW (ref >90)
Glucose, Bld: 151 mg/dL — ABNORMAL HIGH (ref 70–99)

## 2012-05-11 MED ORDER — OLANZAPINE 10 MG PO TABS
10.0000 mg | ORAL_TABLET | Freq: Every day | ORAL | Status: DC
  Administered 2012-05-11: 10 mg via ORAL
  Filled 2012-05-11 (×2): qty 1

## 2012-05-11 MED ORDER — QUETIAPINE FUMARATE ER 200 MG PO TB24
200.0000 mg | ORAL_TABLET | Freq: Every day | ORAL | Status: DC
  Administered 2012-05-12: 200 mg via ORAL
  Filled 2012-05-11: qty 1

## 2012-05-11 MED ORDER — TRAZODONE HCL 100 MG PO TABS
100.0000 mg | ORAL_TABLET | Freq: Every evening | ORAL | Status: DC | PRN
  Filled 2012-05-11: qty 1

## 2012-05-11 NOTE — Progress Notes (Signed)
Physical medicine and rehabilitation consult requested in chart has been reviewed. Physical and occupational therapy evaluations completed an ongoing with recommendations of home health physical and occupational therapy. Patient does have support at home. Will hold on formal rehabilitation consult at this time. Case management to followup with recommendations on discharge to home

## 2012-05-11 NOTE — Progress Notes (Addendum)
Occupational Therapy Treatment Patient Details Name: Ayiana Winslett MRN: [id number] DOB: 03/01/1966 Today's Date: 05/11/2012 Time: 7829-5621 OT Time Calculation (min): 23 min  OT Assessment / Plan / Recommendation Comments on Treatment Session This patient making progress however needs more intense therapy to get back to a point that she would be at a Supervision level (which she has at home). Pt is a high fall risk (has fallen once while here in the hospital). Would benefit from inpatient rehab (or SNF if not appropriate for inpatient  rehab) due to physical  and cognitive deficits as well as the fact that she is Medicaid and would get very limited services/benefits from an OP or HH standpoint.    Follow Up Recommendations  CIR (if not appropriate for CIR then SNF)       Equipment Recommendations  3 in 1 bedside comode;Tub/shower bench    Recommendations for Other Services Rehab consult  Frequency Min 3X/week   Plan Discharge plan needs to be updated    Precautions / Restrictions Precautions Precautions: Fall Precaution Comments: Decreased attention to tasks, especially when she is up on her feet thus increasing her risk for falls. Restrictions Weight Bearing Restrictions: No   Pertinent Vitals/Pain 8/10 headache (Migraine)    ADL  Grooming: Performed;Wash/dry hands;Brushing hair;Min guard (with min A for standing balance) Where Assessed - Grooming: Supported standing Toilet Transfer: Minimal assistance (unsafe with turning to sit down on toliet and too far away) Statistician Method: Sit to Barista: Comfort height toilet;Grab bars Toileting - Clothing Manipulation and Hygiene: Performed;Min guard Where Assessed - Engineer, mining and Hygiene: Standing Equipment Used: Rolling walker;Gait belt Transfers/Ambulation Related to ADLs: While standing to don gown as robe on the back of her, the patient lost her balance backwards and needed Mod A to  regain.  While ambulating in the hall pt consistently ran into objects on the right hand side with RW and did not try to correct (she kept trying to go straight as the RW was turning perpendicular to her).  Feel she is easily distracted when out in the hall with others  moving around as well as talking alot and thus looses focus on just walking safely. ADL Comments: Had patient read one of the signs on her bedside table and she was able to do this without turning the paper and without turning her head--she did have difficulty pronouning some of the words and subsitituted words for other words at times (not sure if this is new or not). Had her locate items on the table in front of her on her right and left and she did not have any trouble with this. Feel she had more success with this task visually due to there were not any distractions from the tasks at hand in her room.      OT Goals ADL Goals ADL Goal: Grooming - Progress:  (lower level today--due to balance deficits) Pt Will Transfer to Toilet: with supervision;Ambulation;with DME;3-in-1 (over toliet) ADL Goal: Statistician - Progress: Goal set today Pt Will Perform Toileting - Clothing Manipulation: with supervision;Standing ADL Goal: Toileting - Clothing Manipulation - Progress: Goal set today Pt Will Perform Toileting - Hygiene: Independently;Sitting on 3-in-1 or toilet ADL Goal: Toileting - Hygiene - Progress: Goal set today Miscellaneous OT Goals Miscellaneous OT Goal #1: Pt will not run into objects on her right as she is ambulating in her room or the hallway to increase safety OT Goal: Miscellaneous Goal #1 - Progress: Goal  set today  Visit Information  Last OT Received On: 05/11/12 Assistance Needed: +1 PT/OT Co-Evaluation/Treatment: Yes    Subjective Data  Subjective: I told you my memory is just not good.      Cognition  Overall Cognitive Status: Impaired Area of Impairment: Attention;Memory;Following  commands;Safety/judgement;Problem solving Arousal/Alertness: Awake/alert Orientation Level: Time;Disoriented to Behavior During Session: [facility] for tasks performed Current Attention Level: Sustained Attention - Other Comments: Easily distracted Memory Deficits: Weren't you here yesterday? (I told her no, that I saw her last week and then outside in the hall eariler this morning)--"Oh, I just can't remember" Following Commands: Follows one step commands inconsistently (Needs increased # of cues at times) Safety/Judgement: Decreased safety judgement for tasks assessed Safety/Judgement - Other Comments: Still forgeting to call instead of getting up by herself (chair and bed alarm), Did not try to correct RW when she ran into objects on her right    Mobility   Transfers Transfers: Sit to Stand;Stand to Sit Sit to Stand: 4: Min assist;With upper extremity assist;With armrests;From chair/3-in-1 Stand to Sit: 4: Min assist;With upper extremity assist;With armrests;To chair/3-in-1             End of Session OT - End of Session Equipment Utilized During Treatment: Gait belt (RW 3/4 of session, HHA the other 1/4) Activity Tolerance: Patient tolerated treatment well Patient left: in chair;with call bell/phone within reach;with chair alarm set Nurse Communication:  (Nursing aware she is +1 A but does not always call)       Evette Georges [phone] 05/11/2012, 2:12 PM
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
## 2012-05-11 NOTE — Progress Notes (Signed)
[facility] PROGRESS NOTE  April [name] WGN:[id number] DOB: 1965-09-09 DOA: 05/04/2012 PCP: No primary provider on file.  Assessment/Plan: #1 acute nonhemorrhagic right hemispheric infarcts involving portions of the right opercular region, right subinsular region, right frontal lobe, right parietal lobe, Likely embolic in nature. Carotid Dopplers negative for ICA stenosis. 2-D echo is negative for any source of emboli. TEE is negative for thrombus. No PFO or ASD. No source of emboli. Continue aspirin and Plavix x30 days per neurology and then full dose aspirin. Continue Lipitor. Risk factor modification. Tobacco cessation. PT/ OT. Neurology following and appreciate input and recommendations.  #2 auditory and visual hallucinations Patient does have a history of schizoaffective disorder and bipolar disorder. Patient states had stopped taking her psychiatric medications one month ago prior to admission. Patient is on Zyprexa, Lamictal, Celexa. Patient states STILL having visual hallucinations. Patient denies any suicidal or homicidal ideations. Psych FF. Per psychiatry.  #3 hyperlipidemia Lipitor.  #4 schizoaffective disorder/bipolar disorder Patient is still having visual hallucinations. Patient states hasn't taken her medications in a month prior to admission. Patient seen by psych over the weekend and recommended increasing zyprexa to 20mg  daily, increasing celexa. Continue lamictal. Trazadone for sleep prn. Will check a TSH. Psychiatry FF.   #5 hyperkalemia Resolved.  #5 tobacco abuse Nicotine patch. Tobacco cessation.  #6 headache Likely a migrainous headache versus post CVA headache. Patient s/p a dose of IV Depakote x1 2 days ago with some improvement however still with bad headache yesterday. Patient was given IV Decadron IV Compazine and IV Reglan with some improvement. Patient states did not sleep last night and has a headache due to that. Follow.   Code Status: Full Family  Communication: Updated patient. No family at bedside Disposition Plan: CIR versus SNF when medically stable.   Consultants:  Neurology: Dr. Roseanne Reno 05/04/2012  Psychiatry Dr. Ferol Luz 05/07/2012  Procedures:  CT head 05/04/2012  MRI head 05/04/2012  2-D echo 05/05/2012  Carotid Dopplers 05/05/2012  TEE 05/06/2012  Antibiotics:  None  HPI/Subjective: Patient sitting in chair. Patient still with visual hallucinations stating she saw rats running on the wall, heard someone in bathroom on the phone, saw a man come through the window stating thatnhe " was going to do her". Patient also states did not sleep much last night and as a result has a headache.  Objective: Filed Vitals:   05/10/12 2227 05/11/12 0223 05/11/12 0641 05/11/12 0925  BP: 131/88 124/76 116/74 127/87  Pulse: 111 100 99 99  Temp: 98.5 F (36.9 C) 98.4 F (36.9 C) 98.8 F (37.1 C) 98.7 F (37.1 C)  TempSrc: Oral Oral Oral Oral  Resp: 20 18 18 18   Height:      Weight:      SpO2: 98% 98% 98% 99%    Intake/Output Summary (Last 24 hours) at 05/11/12 1416 Last data filed at 05/11/12 1218  Gross per 24 hour  Intake    960 ml  Output      0 ml  Net    960 ml   Filed Weights   05/05/12 2052  Weight: 146.33 kg (322 lb 9.6 oz)    Exam:   General:  NAD. Patient sitting in chair  Cardiovascular: rrr. No lower extremity edema.  Respiratory: ctab  Abdomen: Soft/ NT/ND/+BS  Data Reviewed: Basic Metabolic Panel:  Lab 05/11/12 7846 05/10/12 0908 05/09/12 0600 05/08/12 1744 05/08/12 0642  NA 137 141 139 139 140  K 3.8 3.5 4.4 3.7 5.5*  CL 100 100 101  103 106  CO2 28 29 27 27 22   GLUCOSE 151* 129* 104* 101* 100*  BUN 13 10 12 13 13   CREATININE 0.98 1.09 0.99 0.95 0.88  CALCIUM 9.9 9.7 9.6 9.4 9.1  MG -- -- -- -- --  PHOS -- -- -- -- --   Liver Function Tests: No results found for this basename: AST:5,ALT:5,ALKPHOS:5,BILITOT:5,PROT:5,ALBUMIN:5 in the last 168 hours No results found for this  basename: LIPASE:5,AMYLASE:5 in the last 168 hours No results found for this basename: AMMONIA:5 in the last 168 hours CBC:  Lab 05/07/12 0545 05/04/12 2137 05/04/12 1756 05/04/12 1733  WBC 7.3 9.0 -- 8.1  NEUTROABS -- -- -- 4.1  HGB 11.8* 13.1 14.3 13.0  HCT 36.5 39.3 42.0 39.4  MCV 90.1 89.1 -- 89.5  PLT 268 311 -- 297   Cardiac Enzymes: No results found for this basename: CKTOTAL:5,CKMB:5,CKMBINDEX:5,TROPONINI:5 in the last 168 hours BNP (last 3 results) No results found for this basename: PROBNP:3 in the last 8760 hours CBG: No results found for this basename: GLUCAP:5 in the last 168 hours  Recent Results (from the past 240 hour(s))  URINE CULTURE     Status: Normal   Collection Time   05/06/12  8:30 PM      Component Value Range Status Comment   Specimen Description URINE, CLEAN CATCH   Final    Special Requests NONE   Final    Culture  Setup Time 05/06/2012 20:54   Final    Colony Count 30,000 COLONIES/ML   Final    Culture     Final    Value: Multiple bacterial morphotypes present, none predominant. Suggest appropriate recollection if clinically indicated.   Report Status 05/08/2012 FINAL   Final      Studies: No results found.  Scheduled Meds:    . aspirin  81 mg Oral Daily  . atorvastatin  20 mg Oral q1800  . citalopram  40 mg Oral Daily  . clonazePAM  1 mg Oral Daily  . clopidogrel  75 mg Oral Q breakfast  . enoxaparin  40 mg Subcutaneous Q24H  . hydrOXYzine  50 mg Oral TID  . lamoTRIgine  200 mg Oral BID  . nicotine  21 mg Transdermal Daily  . OLANZapine  20 mg Oral QHS   Continuous Infusions:    Principal Problem:  *CVA (cerebral infarction) Active Problems:  Hypertension  Schizoaffective disorder, bipolar type  Hallucination  Hyperlipidemia  Tobacco abuse  Depression  Hyperkalemia  Headache    Time spent:  > 35 mins    [facility]  [facility] Pager [phone]. If 8PM-8AM, please contact night-coverage at [email or web address],  password [facility] 05/11/2012, 2:16 PM  LOS: 7 days

## 2012-05-11 NOTE — Consult Note (Signed)
Physical Medicine and Rehabilitation Consult Reason for Consult: CVA Referring Physician: [facility]   HPI: April [name] is a 46 y.o. right-handed female with history of bipolar disorder, schizoaffective schizophrenia, hypertension and tobacco abuse. Admitted 05/04/2012 with left-sided weakness. MRI of the brain showed acutel right hemispheric infarct. 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 Echocardiogram with ejection fraction of 65% without thrombus. Carotid Dopplers with no ICA stenosis. TEE completed showing EF of 65% again without thrombus or PFO. Neurology services consulted maintained on aspirin Plavix therapy as well as subcutaneous Lovenox for DVT prophylaxis. NicoDerm patch was placed for history of tobacco abuse. Patient remained on Celexa, Zyprexa and Lamictal for history of schizo affective disorder. Urine drug screen was positive for marijuana. Physical occupational therapy ongoing mobility has waxed and waned. There was consideration of home health therapies until followup by occupational therapist recommendations of physical medicine rehabilitation consult to consider inpatient rehabilitation services   Review of Systems  Musculoskeletal: Positive for myalgias.  Neurological: Positive for weakness.  Psychiatric/Behavioral: Positive for depression.       Anxiety, bipolar disorder  All other systems reviewed and are negative.   Past Medical History  Diagnosis Date  . Hypertension   . Bipolar 1 disorder   . Schizo affective schizophrenia   . Depression, major   . Hyperlipidemia 05/06/2012  . Tobacco abuse 05/06/2012  . Depression 05/06/2012   Past Surgical History  Procedure Date  . Cholecystectomy   . Abdominal hysterectomy   . Tee without cardioversion 05/06/2012    Procedure: TRANSESOPHAGEAL ECHOCARDIOGRAM (TEE);  Surgeon: Laurey Morale, MD;  Location: [facility] ENDOSCOPY;  Service: Cardiovascular;  Laterality: N/A;    History reviewed. No pertinent family history. Social History:  reports that she has been smoking.  She does not have any smokeless tobacco history on file. She reports that she drinks alcohol. She reports that she uses illicit drugs (Marijuana). Allergies:  Allergies  Allergen Reactions  . Morphine And Related Hives and Itching   Medications Prior to Admission  Medication Sig Dispense Refill  . citalopram (CELEXA) 20 MG tablet Take 30 mg by mouth daily.      . clonazePAM (KLONOPIN) 1 MG tablet Take 1.5 mg by mouth daily. Scheduled dose      . cloNIDine (CATAPRES) 0.1 MG tablet Take 0.1 mg by mouth 2 (two) times daily.      . hydrOXYzine (ATARAX/VISTARIL) 50 MG tablet Take 50 mg by mouth 3 (three) times daily. Scheduled doses      . lamoTRIgine (LAMICTAL) 200 MG tablet Take 200 mg by mouth 2 (two) times daily.      Marland Kitchen lithium carbonate 300 MG capsule Take 300 mg by mouth 2 (two) times daily with a meal.      . OLANZapine (ZYPREXA) 15 MG tablet Take 15 mg by mouth at bedtime.      Marland Kitchen thiothixene (NAVANE) 2 MG capsule Take 2 mg by mouth 2 (two) times daily.        Home: Home Living Lives With: Friend(s) Available Help at Discharge: Friend(s);Available 24 hours/day Type of Home: House Home Access: Stairs to enter [employer] of Steps: 3 Entrance Stairs-Rails: None Home Layout: One level Bathroom Shower/Tub: Engineer, manufacturing systems: Standard Bathroom Accessibility: Yes Home Adaptive Equipment: None  Functional History: Prior Function Able to Take Stairs?: Yes Driving: Yes Vocation: On disability Functional Status:  Mobility: Bed Mobility Bed Mobility: Supine to Sit Supine to Sit: 6:  Modified independent (Device/Increase time) Sitting - Scoot to Edge of Bed: 6: Modified independent (Device/Increase time) Transfers Transfers: Sit to Stand;Stand to Sit Sit to Stand: 4: Min assist;With upper extremity assist;From bed Stand to Sit: 4: Min assist;With upper  extremity assist;To chair/3-in-1 Ambulation/Gait Ambulation/Gait Assistance: 4: Min guard Ambulation Distance (Feet): 200 Feet Assistive device: Rolling walker Ambulation/Gait Assistance Details: Pt ambulated with decreased support of LLE, increased use of UEs. improvements with increased distance Gait Pattern: Step-through pattern;Decreased stride length;Left hip hike;Left circumduction Gait velocity: decreased Stairs: Yes Stairs Assistance: 4: Min assist Stairs Assistance Details (indicate cue type and reason): Instructed pt on safe technique using RW backwrds. min assist for stability.  Stair Management Technique: No rails;Backwards;Step to pattern;With walker Number of Stairs: 3  Wheelchair Mobility Wheelchair Mobility: No  ADL: ADL Eating/Feeding: Independent;Other (comment) (pt stated she has difficulty opening packkages) Where Assessed - Eating/Feeding: Chair Grooming: Performed;Wash/dry hands;Independent Where Assessed - Grooming: Unsupported standing Upper Body Bathing: Performed;Independent Where Assessed - Upper Body Bathing: Unsupported sitting Lower Body Bathing: Performed;Minimal assistance (needed increased time to don left sock) Where Assessed - Lower Body Bathing: Unsupported sit to stand Upper Body Dressing: Simulated;Independent Where Assessed - Upper Body Dressing: Unsupported sitting Lower Body Dressing: Performed;Minimal assistance (increased time to don left sock) Where Assessed - Lower Body Dressing: Unsupported sit to stand Toilet Transfer: Performed;Independent Toilet Transfer Method: Sit to Barista: Regular height toilet ADL Comments: focus of session on FM/GM coordination acitivties. Pt educated on additonal theraputty exercies to address pinch strength. Educated pt on BUE integrated tasks and activities to address apparent motor impersistence.  Cognition: Cognition Overall Cognitive Status: Impaired Arousal/Alertness:  Awake/alert Orientation Level: Oriented X4 Attention: Selective Selective Attention: Impaired Selective Attention Impairment: Verbal basic;Verbal complex Memory: Impaired Memory Impairment: Storage deficit;Retrieval deficit;Decreased short term memory Awareness: Impaired Awareness Impairment: Emergent impairment Problem Solving: Impaired Problem Solving Impairment: Verbal basic;Functional basic Executive Function: Organizing;Decision Making;Self Monitoring Decision Making: Impaired Decision Making Impairment: Verbal basic;Verbal complex Self Monitoring: Impaired Self Monitoring Impairment: Verbal basic;Verbal complex Behaviors: Impulsive;Poor frustration tolerance Safety/Judgment: Impaired Cognition Overall Cognitive Status: Impaired Area of Impairment: Memory Arousal/Alertness: Awake/alert Orientation Level: Disoriented to;Time Behavior During Session: WFL for tasks performed Memory: Decreased recall of precautions Memory Deficits: Kept asking the same questions over and over Safety/Judgement: Decreased awareness of safety precautions;Decreased safety judgement for tasks assessed Safety/Judgement - Other Comments: Forgets she should not get up by herself so chair and bed alarms have to be used with her. Awareness of Deficits: Pt. asking if she was going to be ok to go home and be on her own, with increased unsteadiness and poor safety awareness throughout session. Cognition - Other Comments: Feel cognition is most likely baseline (Has schitzoaffective disorder, bipolar and )  Blood pressure 127/87, pulse 99, temperature 98.7 F (37.1 C), temperature source Oral, resp. rate 18, height 5\' 8"  (1.727 m), weight 146.33 kg (322 lb 9.6 oz), SpO2 99.00%. Physical Exam  Vitals reviewed. Constitutional: She is oriented to person, place, and time.  HENT:  Head: Normocephalic.  Eyes:       Pupils round reactive to light  Neck: Normal range of motion. Neck supple. No thyromegaly present.   Cardiovascular: Normal rate and regular rhythm.   Pulmonary/Chest: Effort normal and breath sounds normal. She has no wheezes.  Abdominal: Soft. Bowel sounds are normal. There is no tenderness.  Musculoskeletal: She exhibits no edema.  Neurological: She is alert and oriented to person, place, and time.  She is somewhat anxious. She does follow three-step commands  Skin: Skin is warm and dry.    Results for orders placed during the hospital encounter of 05/04/12 (from the past 24 hour(s))  BASIC METABOLIC PANEL     Status: Abnormal   Collection Time   05/11/12  6:42 AM      Component Value Range   Sodium 137  135 - 145 mEq/L   Potassium 3.8  3.5 - 5.1 mEq/L   Chloride 100  96 - 112 mEq/L   CO2 28  19 - 32 mEq/L   Glucose, Bld 151 (*) 70 - 99 mg/dL   BUN 13  6 - 23 mg/dL   Creatinine, Ser 4.54  0.50 - 1.10 mg/dL   Calcium 9.9  8.4 - 09.8 mg/dL   GFR calc non Af Amer 68 (*) >90 mL/min   GFR calc Af Amer 78 (*) >90 mL/min   No results found.  Assessment/Plan: Diagnosis: right CVA 1. Does the need for close, 24 hr/day medical supervision in concert with the patient's rehab needs make it unreasonable for this patient to be served in a less intensive setting? No 2. Co-Morbidities requiring supervision/potential complications: see above 3. Due to bladder management, safety, skin/wound care, pain management and patient education, does the patient require 24 hr/day rehab nursing? No 4. Does the patient require coordinated care of a physician, rehab nurse, PT, OT to address physical and functional deficits in the context of the above medical diagnosis(es)? No Addressing deficits in the following areas: balance, endurance, locomotion, strength, transferring, bowel/bladder control, bathing, dressing, feeding, grooming and toileting 5. Can the patient actively participate in an intensive therapy program of at least 3 hrs of therapy per day at least 5 days per week? Potentially 6. The  potential for patient to make measurable gains while on inpatient rehab is fair 7. Anticipated functional outcomes upon discharge from inpatient rehab are n/a with PT, n/a with OT, n/a with SLP. 8. Estimated rehab length of stay to reach the above functional goals is: n/a 9. Does the patient have adequate social supports to accommodate these discharge functional goals? Potentially 10. Anticipated D/C setting: Home 11. Anticipated post D/C treatments: HH therapy 12. Overall Rehab/Functional Prognosis: good  RECOMMENDATIONS: This patient's condition is appropriate for continued rehabilitative care in the following setting: HH Patient has agreed to participate in recommended program.   Note that insurance prior authorization may be required for reimbursement for recommended care.  Comment: If support system unable to provide for current supervision/min assist needs then pursue short term SNF  Ivory Broad  05/11/2012

## 2012-05-11 NOTE — Progress Notes (Signed)
Physical Therapy Treatment Patient Details Name: Leba Tibbitts MRN: [id number] DOB: 1966/05/20 Today's Date: 05/11/2012 Time: 4098-1191 PT Time Calculation (min): 25 min  PT Assessment / Plan / Recommendation Comments on Treatment Session  Pt. continuing to have fluctuations in her performance during therapy sessions. Today, pt. with decreased safety awareness and needing mod (A) with ambulation. Pt. with little regard to running into obstacles or trying to self correct once she has run into an obstacle in the hallway. Pt presenting distracted throughout. At this time, would be recommending CIR, 24 hr supervision/(A), and SNF if not not approved by CIR. WIll discuss with PT the changes in d/c venue.     Follow Up Recommendations  CIR;Supervision/Assistance - 24 hour;Other (comment) (SNF if not approved by CIR)     Does the patient have the potential to tolerate intense rehabilitation     Barriers to Discharge        Equipment Recommendations       Recommendations for Other Services Rehab consult  Frequency Min 3X/week   Plan Discharge plan needs to be updated    Precautions / Restrictions Precautions Precautions: Fall Precaution Comments: Decreased attention to tasks, especially when she is up on her feet thus increasing her risk for falls. Restrictions Weight Bearing Restrictions: No   Pertinent Vitals/Pain Patient rates pain in her head 8/10 (migraine)   Mobility  Bed Mobility Bed Mobility: Not assessed Details for Bed Mobility Assistance: Pt. presented to be sitting in recliner upon arrival to room with a towel over her eyes; pt. reporting the lights and sounds were "too much to handle" Transfers Transfers: Sit to Stand;Stand to Sit Sit to Stand: 4: Min assist;With upper extremity assist;From chair/3-in-1 Stand to Sit: 4: Min assist;With upper extremity assist;With armrests;To chair/3-in-1 Details for Transfer Assistance: Pt given min (A) with transfers during this session as  she was unsteady with initially rising and with a strong posterior lean requiring mod (A) to correct. Pt. given max VC for proper hand placement and to focus on tasks at hand. Ambulation/Gait Ambulation/Gait Assistance: 3: Mod assist Ambulation Distance (Feet): 150 Feet Assistive device: Rolling walker Ambulation/Gait Assistance Details: Pt. with very poor safety awareness and poor use of RW with ambulation during this session. Pt. unsteady and needing frequent reminders of proper hand placement on RW handles. Pt. would run into obstacles or the wall and continue to move; pt. would not stop to see what she had run into or try and self correct after running into an obstacle. Gait Pattern: Step-through pattern;Decreased stride length;Shuffle;Left hip hike;Left circumduction (Shuffle with Lt LE) Stairs: No Wheelchair Mobility Wheelchair Mobility: No Modified Rankin (Stroke Patients Only) Pre-Morbid Rankin Score: No symptoms Modified Rankin: Moderately severe disability      PT Goals Acute Rehab PT Goals PT Goal Formulation: With patient Time For Goal Achievement: 05/12/12 Potential to Achieve Goals: Good Pt will Ambulate: >150 feet;with modified independence PT Goal: Ambulate - Progress: Progressing toward goal Pt will Go Up / Down Stairs: 3-5 stairs;with modified independence Pt will Perform Home Exercise Program: Independently  Visit Information  Last PT Received On: 05/11/12 Assistance Needed: +1 PT/OT Co-Evaluation/Treatment: Yes    Subjective Data  Subjective: "I just need to keep this cover on my head, I need some quiet and dark"   Cognition  Overall Cognitive Status: Impaired Area of Impairment: Attention;Memory;Following commands;Safety/judgement;Problem solving Arousal/Alertness: Awake/alert Orientation Level: Disoriented to;Time Behavior During Session: WFL for tasks performed Current Attention Level: Sustained Attention - Other Comments: Easily distracted Memory  Deficits: Pt reporting throughout session that she could not remember items asked or who we (PT/OT) were Following Commands: Follows one step commands inconsistently Safety/Judgement: Decreased safety judgement for tasks assessed;Decreased awareness of safety precautions Safety/Judgement - Other Comments: Pt. having to be reminded to call to have someone help with standing and decreased safety awareness for use of RW.    Balance  Balance Balance Assessed: Yes Static Standing Balance Static Standing - Balance Support: Bilateral upper extremity supported;During functional activity Static Standing - Level of Assistance: 4: Min assist Static Standing - Comment/# of Minutes: Pt. min (A) with standing balance during clean up from toileting and washing hands. Pt given VC for being safe and paying attention to task. Pt. presenting to be very distracted throughout session.  End of Session PT - End of Session Equipment Utilized During Treatment: Gait belt Activity Tolerance: Patient tolerated treatment well Patient left: in chair;with call bell/phone within reach;with chair alarm set Nurse Communication: Mobility status    Mertie Clause, SPTA 05/11/2012, 3:19 PM  Agree with above assessment.  Lewis Shock, PT, DPT Pager #: [phone] Office #: [phone]
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
## 2012-05-11 NOTE — Progress Notes (Signed)
Patient is refusing the chair and bed alarm.

## 2012-05-11 NOTE — Progress Notes (Signed)
Ahmia Colford, PTA [phone] 05/11/2012  

## 2012-05-11 NOTE — Progress Notes (Signed)
Physical Therapy Treatment Patient Details Name: April [name] MRN: [id number] DOB: [birth year] Today's Date: 05/11/2012 Time: 8469-6295 PT Time Calculation (min): 25 min  PT Assessment / Plan / Recommendation Comments on Treatment Session  Pt. continuing to have fluctuations in her performance during therapy sessions. Today, pt. with decreased safety awareness and needing mod (A) with ambulation. Pt. with little regard to running into obstacles or trying to self correct once she has run into an obstacle in the hallway. Pt presenting distracted throughout. At this time, would be recommending CIR, 24 hr supervision/(A), and SNF if not not approved by CIR. WIll discuss with PT the changes in d/c venue.     Follow Up Recommendations  CIR;Supervision/Assistance - 24 hour;Other (comment) (SNF if not approved by CIR)     Does the patient have the potential to tolerate intense rehabilitation     Barriers to Discharge        Equipment Recommendations       Recommendations for Other Services Rehab consult  Frequency Min 3X/week   Plan Discharge plan needs to be updated    Precautions / Restrictions Precautions Precautions: Fall Precaution Comments: Decreased attention to tasks, especially when she is up on her feet thus increasing her risk for falls. Restrictions Weight Bearing Restrictions: No   Pertinent Vitals/Pain Patient rates pain in her head 8/10 (migraine)   Mobility  Bed Mobility Bed Mobility: Not assessed Details for Bed Mobility Assistance: Pt. presented to be sitting in recliner upon arrival to room with a towel over her eyes; pt. reporting the lights and sounds were "too much to handle" Transfers Transfers: Sit to Stand;Stand to Sit Sit to Stand: 4: Min assist;With upper extremity assist;From chair/3-in-1 Stand to Sit: 4: Min assist;With upper extremity assist;With armrests;To chair/3-in-1 Details for Transfer Assistance: Pt given min (A) with transfers during this session as  she was unsteady with initially rising and with a strong posterior lean requiring mod (A) to correct. Pt. given max VC for proper hand placement and to focus on tasks at hand. Ambulation/Gait Ambulation/Gait Assistance: 3: Mod assist Ambulation Distance (Feet): 150 Feet Assistive device: Rolling walker Ambulation/Gait Assistance Details: Pt. with very poor safety awareness and poor use of RW with ambulation during this session. Pt. unsteady and needing frequent reminders of proper hand placement on RW handles. Pt. would run into obstacles or the wall and continue to move; pt. would not stop to see what she had run into or try and self correct after running into an obstacle. Gait Pattern: Step-through pattern;Decreased stride length;Shuffle;Left hip hike;Left circumduction (Shuffle with Lt LE) Stairs: No Wheelchair Mobility Wheelchair Mobility: No Modified Rankin (Stroke Patients Only) Pre-Morbid Rankin Score: No symptoms Modified Rankin: Moderately severe disability      PT Goals Acute Rehab PT Goals PT Goal Formulation: With patient Time For Goal Achievement: 05/12/12 Potential to Achieve Goals: Good Pt will Ambulate: >150 feet;with modified independence PT Goal: Ambulate - Progress: Progressing toward goal Pt will Go Up / Down Stairs: 3-5 stairs;with modified independence Pt will Perform Home Exercise Program: Independently  Visit Information  Last PT Received On: 05/11/12 Assistance Needed: +1 PT/OT Co-Evaluation/Treatment: Yes    Subjective Data  Subjective: "I just need to keep this cover on my head, I need some quiet and dark"   Cognition  Overall Cognitive Status: Impaired Area of Impairment: Attention;Memory;Following commands;Safety/judgement;Problem solving Arousal/Alertness: Awake/alert Orientation Level: Disoriented to;Time Behavior During Session: WFL for tasks performed Current Attention Level: Sustained Attention - Other Comments: Easily distracted Memory  Deficits: Pt reporting throughout session that she could not remember items asked or who we (PT/OT) were Following Commands: Follows one step commands inconsistently Safety/Judgement: Decreased safety judgement for tasks assessed;Decreased awareness of safety precautions Safety/Judgement - Other Comments: Pt. having to be reminded to call to have someone help with standing and decreased safety awareness for use of RW.    Balance  Balance Balance Assessed: Yes Static Standing Balance Static Standing - Balance Support: Bilateral upper extremity supported;During functional activity Static Standing - Level of Assistance: 4: Min assist Static Standing - Comment/# of Minutes: Pt. min (A) with standing balance during clean up from toileting and washing hands. Pt given VC for being safe and paying attention to task. Pt. presenting to be very distracted throughout session.  End of Session PT - End of Session Equipment Utilized During Treatment: Gait belt Activity Tolerance: Patient tolerated treatment well Patient left: in chair;with call bell/phone within reach;with chair alarm set Nurse Communication: Mobility status    Mertie Clause, SPTA 05/11/2012, 3:19 PM

## 2012-05-12 MED ORDER — OLANZAPINE 5 MG PO TABS
5.0000 mg | ORAL_TABLET | Freq: Every day | ORAL | Status: DC
  Filled 2012-05-12: qty 1

## 2012-05-12 MED ORDER — QUETIAPINE FUMARATE ER 50 MG PO TB24
50.0000 mg | ORAL_TABLET | Freq: Once | ORAL | Status: AC
  Administered 2012-05-12: 50 mg via ORAL
  Filled 2012-05-12: qty 1

## 2012-05-12 MED ORDER — QUETIAPINE FUMARATE ER 50 MG PO TB24
150.0000 mg | ORAL_TABLET | Freq: Every day | ORAL | Status: DC
  Administered 2012-05-13: 150 mg via ORAL
  Filled 2012-05-12 (×2): qty 3

## 2012-05-12 MED ORDER — OLANZAPINE 5 MG PO TABS
5.0000 mg | ORAL_TABLET | Freq: Every day | ORAL | Status: DC
  Administered 2012-05-13 – 2012-05-14 (×2): 5 mg via ORAL
  Filled 2012-05-12 (×2): qty 1

## 2012-05-12 MED ORDER — PROMETHAZINE HCL 25 MG PO TABS
12.5000 mg | ORAL_TABLET | Freq: Four times a day (QID) | ORAL | Status: DC | PRN
  Administered 2012-05-12 – 2012-05-14 (×4): 25 mg via ORAL
  Filled 2012-05-12 (×4): qty 1

## 2012-05-12 NOTE — Progress Notes (Signed)
SLP Cancellation Note  Patient Details Name: April [name] MRN: [id number] DOB: Jun 25, 1965   Cancelled treatment:        Attempted cognitive therapy, however psychiatrist is currently seeing pt.  Will f/u next date   Royce Macadamia M.Ed [employer] [phone]  05/12/2012

## 2012-05-12 NOTE — Progress Notes (Signed)
Clinical Social Work  CSW met with patient at bedside. Patient reports frustration with nursing staff due staff not believing the objects she is hallucinating about. Patient believes she saw a cat in the room last night that was chasing a mouse. CSW and patient discussed reality testing again but patient is upset that RN will not give her a shot of Ativan. Patient is fixated on shot and reports that pill form of Ativan does not work. Patient reports at previous admission she was provided a shot of Ativan and it made her sleep. CSW and patient discussed how sleeping is not fixing the problem of hallucinating. Patient reports that she is not getting the medication she needs at the hospital and wants to leave. CSW and patient discussed outpatient counseling to assist with coping skills and continuing to work on managing symptoms. Patient refusing therapy at dc. Patient reports she will follow up with [facility] and will set up transportation via [facility] at dc.   CSW and patient discussed PT recommendations for SNF. Patient refusing SNF. Patient only wants to return home. Patient agreeable to [facility] and CM confirms HH has been set up. CSW and patient discussed patient's safety if returning home. Patient reports that roommates and son will assist if needed but reports that she does not desire placement. Patient reports that sister-in-law will transport her home and she is ready to dc.   Patient appears to be at baseline in regards to hallucinations. Patient fixated on medication and refusing outpatient follow up with therapist. Patient understands options and referrals but chooses to decline services. CSW informed psych MD of patient's plans.  [place], [place] [phone]

## 2012-05-12 NOTE — Progress Notes (Signed)
Pt keep on refusing bed and chair alarms.
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
## 2012-05-12 NOTE — Progress Notes (Signed)
[facility] PROGRESS NOTE  April [name] ZOX:[id number] DOB: 04/02/1966 DOA: 05/04/2012 PCP: No primary provider on file.  Assessment/Plan: #1 acute nonhemorrhagic right hemispheric infarcts involving portions of the right opercular region, right subinsular region, right frontal lobe, right parietal lobe, Likely embolic in nature. Carotid Dopplers negative for ICA stenosis. 2-D echo is negative for any source of emboli. TEE is negative for thrombus. No PFO or ASD. No source of emboli. Continue aspirin and Plavix x30 days per neurology and then full dose aspirin. Continue Lipitor. Risk factor modification. Tobacco cessation. PT/ OT. Will need to followup with neurology as outpatient.   #2 auditory and visual hallucinations Patient does have a history of schizoaffective disorder and bipolar disorder. Patient states had stopped taking her psychiatric medications one month ago prior to admission. Patient is on Zyprexa, Lamictal, Celexa. Patient states STILL having visual hallucinations. Patient denies any suicidal or homicidal ideations. Psych FF. Per psychiatry.  #3 hyperlipidemia Lipitor.  #4 schizoaffective disorder/bipolar disorder Patient is still having visual hallucinations. Patient states hasn't taken her medications in a month prior to admission. Patient seen by psych recommended decreasing zyprexa to 5mg  daily,and started patient on Seroquel 150 mg at 8 PM daily. Celexa was increased. Continue lamictal. Will discontinue trazadone for sleep prn. TSH within normal limits. Psychiatry FF.   #5 hyperkalemia Resolved.  #5 tobacco abuse Nicotine patch. Tobacco cessation.  #6 headache Likely a migrainous headache versus post CVA headache. Patient s/p a dose of IV Depakote x1 3 days ago with some improvement however still with bad headache yesterday. Patient was given IV Decadron IV Compazine and IV Reglan with some improvement. Improvement. Patient is to have Seroquel tonight and hopefully  with sleep headache should improve. Follow.   Code Status: Full Family Communication: Updated patient. No family at bedside Disposition Plan: SNF when medically stable.per nursing patient refusing SNF and wanted to go home with home health when stable per psychiatry. SNF versus home with home health   Consultants:  Neurology: Dr. Roseanne Reno 05/04/2012  Psychiatry Dr. Ferol Luz 05/07/2012  Procedures:  CT head 05/04/2012  MRI head 05/04/2012  2-D echo 05/05/2012  Carotid Dopplers 05/05/2012  TEE 05/06/2012  Antibiotics:  None  HPI/Subjective: Patient sitting in chair. Patient still with visual hallucinations stating she saw rats running on the wall, patient also states saw a cat in the room.patient states headache has improved. Patient received Seroquel this morning and slept most of the day.  Objective: Filed Vitals:   05/12/12 0308 05/12/12 0530 05/12/12 1111 05/12/12 1503  BP: 112/74 133/75 128/80 112/75  Pulse: 89 91 95 101  Temp: 97.9 F (36.6 C) 97.7 F (36.5 C) 98.2 F (36.8 C) 97.9 F (36.6 C)  TempSrc: Oral Oral Oral Oral  Resp: 18 18 18 16   Height:      Weight:      SpO2: 98% 99% 99% 99%   No intake or output data in the 24 hours ending 05/12/12 1835 Filed Weights   05/05/12 2052  Weight: 146.33 kg (322 lb 9.6 oz)    Exam:   General:  NAD. Patient sitting in chair  Cardiovascular: rrr. No lower extremity edema.  Respiratory: ctab  Abdomen: Soft/ NT/ND/+BS  Data Reviewed: Basic Metabolic Panel:  Lab 05/11/12 8119 05/10/12 0908 05/09/12 0600 05/08/12 1744 05/08/12 0642  NA 137 141 139 139 140  K 3.8 3.5 4.4 3.7 5.5*  CL 100 100 101 103 106  CO2 28 29 27 27 22   GLUCOSE 151* 129* 104* 101* 100*  BUN 13 10 12 13 13   CREATININE 0.98 1.09 0.99 0.95 0.88  CALCIUM 9.9 9.7 9.6 9.4 9.1  MG -- -- -- -- --  PHOS -- -- -- -- --   Liver Function Tests: No results found for this basename: AST:5,ALT:5,ALKPHOS:5,BILITOT:5,PROT:5,ALBUMIN:5 in the last  168 hours No results found for this basename: LIPASE:5,AMYLASE:5 in the last 168 hours No results found for this basename: AMMONIA:5 in the last 168 hours CBC:  Lab 05/07/12 0545  WBC 7.3  NEUTROABS --  HGB 11.8*  HCT 36.5  MCV 90.1  PLT 268   Cardiac Enzymes: No results found for this basename: CKTOTAL:5,CKMB:5,CKMBINDEX:5,TROPONINI:5 in the last 168 hours BNP (last 3 results) No results found for this basename: PROBNP:3 in the last 8760 hours CBG: No results found for this basename: GLUCAP:5 in the last 168 hours  Recent Results (from the past 240 hour(s))  URINE CULTURE     Status: Normal   Collection Time   05/06/12  8:30 PM      Component Value Range Status Comment   Specimen Description URINE, CLEAN CATCH   Final    Special Requests NONE   Final    Culture  Setup Time 05/06/2012 20:54   Final    Colony Count 30,000 COLONIES/ML   Final    Culture     Final    Value: Multiple bacterial morphotypes present, none predominant. Suggest appropriate recollection if clinically indicated.   Report Status 05/08/2012 FINAL   Final      Studies: No results found.  Scheduled Meds:    . aspirin  81 mg Oral Daily  . atorvastatin  20 mg Oral q1800  . citalopram  40 mg Oral Daily  . clonazePAM  1 mg Oral Daily  . clopidogrel  75 mg Oral Q breakfast  . enoxaparin  40 mg Subcutaneous Q24H  . hydrOXYzine  50 mg Oral TID  . lamoTRIgine  200 mg Oral BID  . nicotine  21 mg Transdermal Daily  . OLANZapine  5 mg Oral QHS  . QUEtiapine  150 mg Oral Daily  . [DISCONTINUED] OLANZapine  10 mg Oral QHS  . [DISCONTINUED] QUEtiapine  200 mg Oral Daily   Continuous Infusions:    Principal Problem:  *CVA (cerebral infarction) Active Problems:  Hypertension  Schizoaffective disorder, bipolar type  Hallucination  Hyperlipidemia  Tobacco abuse  Depression  Hyperkalemia  Headache    Time spent:  > 35 mins    [facility]  [facility] Pager [phone]. If 8PM-8AM,  please contact night-coverage at [email or web address], password [place] Day 05/12/2012, 6:35 PM  LOS: 8 days

## 2012-05-12 NOTE — Progress Notes (Signed)
Progress Notes follow up/consultation  Patient Identification:  April [name] Date of Evaluation:  05/12/2012 Reason for Consult: Schizoaffective Disorder, Visual hallucinations, surgery, CVA  Referring Provider: Dr. Maisie Fus, Dr. Arbutus Leas  History of Present Illness:  Pt admitted for CVA, L sided weakness L arm extremity. She has been asking of iv/IM analgesics, saying that she has a 'migraine headache'  She has been seeing 'rats and a man' in her room all day long 12/10.    Past Psychiatric History:schizoaffective disorder  She stopped taking her medications ~ 2-3 weeks before the CVA  She smokes cannabis, smokes tobacco, drinks some alcohol    Past Medical History:  Past Medical History   Diagnosis  Date   .  Hypertension    .  Bipolar 1 disorder    .  Schizo affective schizophrenia    .  Depression, major    .  Hyperlipidemia  05/06/2012   .  Tobacco abuse  05/06/2012   .  Depression  05/06/2012    Past Surgical History   Procedure  Date   .  Cholecystectomy    .  Abdominal hysterectomy    .  Tee without cardioversion  05/06/2012     Procedure: TRANSESOPHAGEAL ECHOCARDIOGRAM (TEE); Surgeon: Laurey Morale, MD; Location: [facility] ENDOSCOPY; Service: Cardiovascular; Laterality: N/A;    Allergies:  Allergies   Allergen  Reactions   .  Morphine And Related  Hives and Itching    Current Medications:  Prior to Admission medications   Medication  Sig  Start Date  End Date  Taking?  Authorizing Provider   citalopram (CELEXA) 20 MG tablet  Take 30 mg by mouth daily.    Yes  Historical Provider, MD   clonazePAM (KLONOPIN) 1 MG tablet  Take 1.5 mg by mouth daily. Scheduled dose    Yes  Historical Provider, MD   cloNIDine (CATAPRES) 0.1 MG tablet  Take 0.1 mg by mouth 2 (two) times daily.    Yes  Historical Provider, MD   hydrOXYzine (ATARAX/VISTARIL) 50 MG tablet  Take 50 mg by mouth 3 (three) times daily. Scheduled doses    Yes  Historical Provider, MD   lamoTRIgine (LAMICTAL) 200 MG tablet  Take  200 mg by mouth 2 (two) times daily.    Yes  Historical Provider, MD   lithium carbonate 300 MG capsule  Take 300 mg by mouth 2 (two) times daily with a meal.    Yes  Historical Provider, MD   OLANZapine (ZYPREXA) 15 MG tablet  Take 15 mg by mouth at bedtime.    Yes  Historical Provider, MD   thiothixene (NAVANE) 2 MG capsule  Take 2 mg by mouth 2 (two) times daily.    Yes  Historical Provider, MD   Social History:  reports that she has been smoking. She does not have any smokeless tobacco history on file. She reports that she drinks alcohol. She reports that she uses illicit drugs (Marijuana).  Family History:  History reviewed. No pertinent family history.   Mental Status Examination/Evaluation:  Objective: Appearance: Casual   Eye Contact:: Fair   Speech: Clear and Coherent and Normal Rate   Volume: Normal   Mood: distressed  Affect: Appropriate, Congruent and amused with hallucinations;    Thought Process: Coherent, Goal Directed and drug seeking [IM]   Orientation: Full (Time, Place, and Person)   Thought Content: Hallucinations: Visual   Suicidal Thoughts: No   Homicidal Thoughts: No   Judgement: Fair   Insight: Fair  DIAGNOSIS:  AXIS I  Schizoaffective Disorder with visual hallucinations,, Polysubstance abuse  AXIS II  Deferred   AXIS III  See medical notes.   AXIS IV  economic problems, occupational problems, other psychosocial or environmental problems, problems related to social environment and no resources to maintain treatment; [facility] have pt assistance programs   AXIS V  51-60 moderate symptoms   Assessment/Plan: Pt evaluated 12/10-11/13  Pt reported Visual hallucinations Wed., and Thurs, pm.  Tonight 05/13/12, Pt says she has been seeing  A 'cat with kittens' crawling out of the wall.  She insists what she saw was real and was not able to identify this  AS an hallucination.  She has a cloth over her forehead and is resting in a dark room.  She asks for  medication yesterday and to night.  She says she has plans to go to [facility] when discharged.  Pt may be drug seeking becifause there is no measure for migraine headache..   RECOMMENDATION:  1.  Visual Hallucinations may be metabolic based, IF real. 2.  Pt has capacity to return home when medically stable.  3.  No change in medication recommendations.  4.  No further psychiatric needs   MD Psychiatrist signs off. Ying Blankenhorn MD  05/12/2012 10:07 AM

## 2012-05-13 LAB — BASIC METABOLIC PANEL
CO2: 30 mEq/L (ref 19–32)
Calcium: 9.6 mg/dL (ref 8.4–10.5)
Chloride: 104 mEq/L (ref 96–112)
GFR calc Af Amer: 76 mL/min — ABNORMAL LOW (ref >90)
Sodium: 142 mEq/L (ref 135–145)

## 2012-05-13 MED ORDER — PROCHLORPERAZINE EDISYLATE 5 MG/ML IJ SOLN: 5.0000 mg | Freq: Once | INTRAMUSCULAR | Status: DC

## 2012-05-13 MED ORDER — DIPHENHYDRAMINE HCL 25 MG PO CAPS
25.0000 mg | ORAL_CAPSULE | Freq: Once | ORAL | Status: AC
  Administered 2012-05-13: 25 mg via ORAL
  Filled 2012-05-13: qty 1

## 2012-05-13 MED ORDER — PROCHLORPERAZINE EDISYLATE 5 MG/ML IJ SOLN
5.0000 mg | Freq: Once | INTRAMUSCULAR | Status: AC
  Administered 2012-05-13: 5 mg via INTRAMUSCULAR
  Filled 2012-05-13: qty 1

## 2012-05-13 MED ORDER — DIPHENHYDRAMINE HCL 50 MG/ML IJ SOLN: 12.5000 mg | Freq: Once | INTRAMUSCULAR | Status: DC

## 2012-05-13 NOTE — Progress Notes (Signed)
Physical Therapy Treatment Patient Details Name: April [name] MRN: [id number] DOB: 03-Mar-1966 Today's Date: 05/13/2012 Time: 4098-1191 PT Time Calculation (min): 24 min  PT Assessment / Plan / Recommendation Comments on Treatment Session  Pt. presents more focused and less distracted during PT session. Pt reports of seeing a cat in the corner and asking why she could see and it no one else could. Pt. given VC for safe hand placement using RW and ambulated into hallway demonstrating increased awareness of obstacles. Pt. continues to deny wanting to  have further rehab at SNF, but is acceptable to someone coming to the home. Would continue to recommend 24 hr supervision/(A) at this time post d/c. Will consult PT of goal update due to arriving at goal to be met date.    Follow Up Recommendations  CIR;Supervision/Assistance - 24 hour;Other (comment)     Does the patient have the potential to tolerate intense rehabilitation     Barriers to Discharge        Equipment Recommendations       Recommendations for Other Services Rehab consult  Frequency Min 3X/week   Plan Discharge plan needs to be updated    Precautions / Restrictions Precautions Precautions: Fall Restrictions Weight Bearing Restrictions: No   Pertinent Vitals/Pain Patient reports pain 10/10 in Bil ankles and knees from walking; Headache 4/10; RN aware and presented with pain medications during session    Mobility  Bed Mobility Bed Mobility: Supine to Sit;Sitting - Scoot to [place] of Bed;Scooting to HOB Supine to Sit: 6: Modified independent (Device/Increase time) Sitting - Scoot to Edge of Bed: 6: Modified independent (Device/Increase time) Scooting to [facility]: 6: Modified independent (Device/Increase time);With rail Details for Bed Mobility Assistance: pt given no physical (A) to get to sitting at EOB; only VC given for proper hand placement to pull herself up in bed. Transfers Transfers: Sit to Stand;Stand to Sit Sit to  Stand: 4: Min guard;With upper extremity assist;From bed Stand to Sit: 4: Min guard;With upper extremity assist;To bed Details for Transfer Assistance: Pt min guard for safety and steadiness and given VC for proper hand placement stepping to/sitting from RW. Ambulation/Gait Ambulation/Gait Assistance: 4: Min guard Ambulation Distance (Feet): 150 Feet Assistive device: Rolling walker Ambulation/Gait Assistance Details: Pt ambulated into hallway with RW presenting with increased focus and less distracted than in previous sessions. Pt. able to maneuver around obstacles using RW. VC for staying close enough to RW for her safety. Gait Pattern: Step-through pattern;Decreased stride length Gait velocity: decreased Stairs: No Wheelchair Mobility Wheelchair Mobility: No Modified Rankin (Stroke Patients Only) Pre-Morbid Rankin Score: No symptoms Modified Rankin: Moderately severe disability     PT Goals Acute Rehab PT Goals PT Goal Formulation: With patient Time For Goal Achievement: 05/27/12 Potential to Achieve Goals: Good Pt will Ambulate: >150 feet;Independently PT Goal: Ambulate - Progress: Goal set today Pt will Go Up / Down Stairs: 3-5 stairs;Independently PT Goal: Up/Down Stairs - Progress: Goal set today Pt will Perform Home Exercise Program: Independently PT Goal: Perform Home Exercise Program - Progress: Goal set today  Visit Information  Last PT Received On: 05/13/12 Assistance Needed: +1    Subjective Data  Subjective: "I just got back into bed; my ankles and knees are killing me" Patient Stated Goal: Return home   Cognition  Overall Cognitive Status: Impaired Area of Impairment: Safety/judgement Arousal/Alertness: Awake/alert Orientation Level: Appears intact for tasks assessed Behavior During Session: [facility] for tasks performed Attention - Other Comments: Pt. presents to be more focused  during todays session in hallway and around distractions. Cognition - Other  Comments: Pt. continuing to have hallucinations during session; presents more aware and focused during session     Balance     End of Session PT - End of Session Equipment Utilized During Treatment: Gait belt Activity Tolerance: Patient tolerated treatment well Patient left: in bed;with call bell/phone within reach;with bed alarm set Nurse Communication: Mobility status;Other (comment);Patient requests pain meds (Bed alarm set; Pt seeing cat in room)    Garfield Coiner, SPTA 05/13/2012, 11:00 AM

## 2012-05-13 NOTE — Progress Notes (Signed)
Physical Therapy Treatment Patient Details Name: April [name] MRN: [id number] DOB: 20-Dec-1965 Today's Date: 05/13/2012 Time: 4098-1191 PT Time Calculation (min): 24 min  PT Assessment / Plan / Recommendation Comments on Treatment Session  Pt. presents more focused and less distracted during PT session. Pt reports of seeing a cat in the corner and asking why she could see and it no one else could. Pt. given VC for safe hand placement using RW and ambulated into hallway demonstrating increased awareness of obstacles. Pt. continues to deny wanting to  have further rehab at SNF, but is acceptable to someone coming to the home. Would continue to recommend 24 hr supervision/(A) at this time post d/c. Will consult PT of goal update due to arriving at goal to be met date.    Follow Up Recommendations  CIR;Supervision/Assistance - 24 hour;Other (comment)     Does the patient have the potential to tolerate intense rehabilitation     Barriers to Discharge        Equipment Recommendations       Recommendations for Other Services Rehab consult  Frequency Min 3X/week   Plan Discharge plan needs to be updated    Precautions / Restrictions Precautions Precautions: Fall Restrictions Weight Bearing Restrictions: No   Pertinent Vitals/Pain Patient reports pain 10/10 in Bil ankles and knees from walking; Headache 4/10; RN aware and presented with pain medications during session    Mobility  Bed Mobility Bed Mobility: Supine to Sit;Sitting - Scoot to [place] of Bed;Scooting to HOB Supine to Sit: 6: Modified independent (Device/Increase time) Sitting - Scoot to Edge of Bed: 6: Modified independent (Device/Increase time) Scooting to [facility]: 6: Modified independent (Device/Increase time);With rail Details for Bed Mobility Assistance: pt given no physical (A) to get to sitting at EOB; only VC given for proper hand placement to pull herself up in bed. Transfers Transfers: Sit to Stand;Stand to Sit Sit to  Stand: 4: Min guard;With upper extremity assist;From bed Stand to Sit: 4: Min guard;With upper extremity assist;To bed Details for Transfer Assistance: Pt min guard for safety and steadiness and given VC for proper hand placement stepping to/sitting from RW. Ambulation/Gait Ambulation/Gait Assistance: 4: Min guard Ambulation Distance (Feet): 150 Feet Assistive device: Rolling walker Ambulation/Gait Assistance Details: Pt ambulated into hallway with RW presenting with increased focus and less distracted than in previous sessions. Pt. able to maneuver around obstacles using RW. VC for staying close enough to RW for her safety. Gait Pattern: Step-through pattern;Decreased stride length Gait velocity: decreased Stairs: No Wheelchair Mobility Wheelchair Mobility: No Modified Rankin (Stroke Patients Only) Pre-Morbid Rankin Score: No symptoms Modified Rankin: Moderately severe disability     PT Goals Acute Rehab PT Goals PT Goal Formulation: With patient Time For Goal Achievement: 05/27/12 Potential to Achieve Goals: Good Pt will Ambulate: >150 feet;Independently PT Goal: Ambulate - Progress: Goal set today Pt will Go Up / Down Stairs: 3-5 stairs;Independently PT Goal: Up/Down Stairs - Progress: Goal set today Pt will Perform Home Exercise Program: Independently PT Goal: Perform Home Exercise Program - Progress: Goal set today  Visit Information  Last PT Received On: 05/13/12 Assistance Needed: +1    Subjective Data  Subjective: "I just got back into bed; my ankles and knees are killing me" Patient Stated Goal: Return home   Cognition  Overall Cognitive Status: Impaired Area of Impairment: Safety/judgement Arousal/Alertness: Awake/alert Orientation Level: Appears intact for tasks assessed Behavior During Session: [facility] for tasks performed Attention - Other Comments: Pt. presents to be more focused  during todays session in hallway and around distractions. Cognition - Other  Comments: Pt. continuing to have hallucinations during session; presents more aware and focused during session     Balance     End of Session PT - End of Session Equipment Utilized During Treatment: Gait belt Activity Tolerance: Patient tolerated treatment well Patient left: in bed;with call bell/phone within reach;with bed alarm set Nurse Communication: Mobility status;Other (comment);Patient requests pain meds (Bed alarm set; Pt seeing cat in room)    STROUD, LAURA, SPTA 05/13/2012, 11:00 AM  Agree with above update of goals.  Lewis Shock, PT, DPT Pager #: [phone] Office #: [phone]

## 2012-05-13 NOTE — Progress Notes (Signed)
Speech Language Pathology Treatment Patient Details Name: April [name] MRN: [id number] DOB: Nov 01, 1965 Today's Date: 05/13/2012 Time: 6578-4696 SLP Time Calculation (min): 33 min  Assessment / Plan / Recommendation Clinical Impression  Pt continues to exhibit decreased high level cognition, specifically recall, awareness of deficits and functional impact.  Verbal problem solving is superficial, and without reasoning (pt verbalizes not being ok to get up independently, but states rationale as "they told me not to"/    SLP Plan  Continue with current plan of care    Pertinent Vitals/Pain Migraine headache, 7-8/10  SLP Goals  SLP Goals Potential to Achieve Goals: Good Progress/Goals/Alternative treatment plan discussed with pt/caregiver and they: Agree SLP Goal #1 - Progress: Progressing toward goal SLP Goal #2 - Progress: Progressing toward goal  General Temperature Spikes Noted: No Respiratory Status: Room air Behavior/Cognition: Alert;Cooperative Patient Positioning: Partially reclined   Treatment Treatment focused on: Cognition Skilled Treatment: Pt able to verbalize safe and accurate responses to questions regarding functional situations, but requires cues to indicate rationale ("they told me to" is her usual response). Pt verbalizes awareness of memory deficits, but is unable to verbalize strategies to compensate.  Establishment of compensatory strategy not addressed at this time, due to pt having headache, and preferring to keep lights off, eyes closed, and cold cloth on forehead. Will address strategies to improve recall when pain subsides. Limited  awareness of functional impact of memory impairment ("I just forget stuff, so they won't let me do anything"). Pt continues to report rats on the walls, and a kitten scratching at the wall to get out.     Beauregard Jarrells B. [place], [facility], CCC-SLP [phone]    April [name] 05/13/2012, 2:33 PM

## 2012-05-13 NOTE — Progress Notes (Signed)
Pt has complained frequently about her headache, and also her lower legs hurting.  Offered meds as available, has not indicated to this nurse that she needed a short,  Appears to sleep after medicated although it is hard to access as keeps her face covered and lights out, but when checked pt is quiet, respirations are regular.

## 2012-05-13 NOTE — Progress Notes (Signed)
[facility] PROGRESS NOTE  April [name] AVW:[id number] DOB: [birth year] DOA: 05/04/2012 PCP: No primary provider on file.  Assessment/Plan: #1 acute nonhemorrhagic right hemispheric infarcts involving portions of the right opercular region, right subinsular region, right frontal lobe, right parietal lobe,  Likely embolic in nature -Carotid Dopplers negative for ICA stenosis -2-D echo is negative for any source of emboli -TEE is negative for thrombus. No PFO or ASD. No source of emboli -Continue aspirin and Plavix x30 days per neurology and then full dose aspirin -Continue Lipitor. Risk factor modification. Tobacco cessation. PT/ OT. Will need to followup with neurology as outpatient.  #2 auditory and visual hallucinations  -history of schizoaffective disorder and bipolar disorder. Patient states had stopped taking her psychiatric medications one month ago prior to admission. Patient is on Zyprexa, Lamictal, Celexa. Patient states STILL having visual hallucinations. Patient denies any suicidal or homicidal ideations -pt has hallucinations at home at baseline -appreciate psychiatry followup #3 hyperlipidemia  Lipitor.  #4 schizoaffective disorder/bipolar disorder  Patient is still having visual hallucinations - Patient states hasn't taken her medications in a month prior to admission -psych recommended decreasing zyprexa to 5mg  daily,and started patient on Seroquel 150 mg at 8 PM daily -Celexa was increased. Continue lamictal -Will discontinue trazadone for sleep prn. TSH within normal limits. Psychiatry FF.  #5 hyperkalemia  Resolved.  #5 tobacco abuse  Nicotine patch. Tobacco cessation.  #6 headache  Likely a migrainous headache versus post CVA headache -s/p a dose of IV Depakote x1 3 days ago with some improvement however still with bad headache yesterday -given IV Decadron IV Compazine and IV Reglan with some improvement. Improvement. Patient is to have Seroquel tonight and  hopefully with sleep headache should improve -Give one-time dose Benadryl and Compazine Code Status: Full  Family Communication: Updated patient. No family at bedside  Disposition Plan: SNF when medically stable.per nursing patient refusing SNF and wanted to go home with home health when stable per psychiatry. SNF versus home with home health  Consultants:  Neurology: Dr. Roseanne Reno 05/04/2012  Psychiatry Dr. Ferol Luz 05/07/2012 Procedures:  CT head 05/04/2012  MRI head 05/04/2012  2-D echo 05/05/2012  Carotid Dopplers 05/05/2012  TEE 05/06/2012          Procedures/Studies: Ct Head Wo Contrast  05/04/2012  *RADIOLOGY REPORT*  Clinical Data: Weakness.  Unable views left arm.  CT HEAD WITHOUT CONTRAST  Technique:  Contiguous axial images were obtained from the base of the skull through the vertex without contrast.  Comparison: CT head without contrast 08/09/2007.  Findings: Focal hypoattenuation involves the anterior insular cortex and frontal operculum.  There is no associated hemorrhage. There is focal sulcal effacement.  No other focal cortical infarct is evident.  Scattered subcortical white matter hypoattenuation is present bilaterally.  The ventricles are of normal size.  No significant extra-axial fluid collection is present.  The paranasal sinuses and mastoid air cells are clear.  The osseous skull is intact.  IMPRESSION:  1.  Interval right MCA territory infarct involving the insular cortex and inferior right frontal operculum.  This is likely acute. 2.  There is some local mass effect without significant midline shift. 3.  Scattered subcortical white matter hypoattenuation bilaterally likely reflects the sequelae of chronic microvascular ischemia.  These results were called by telephone on 05/04/2012 at 03:45 p.m. to Dr. Ranae Palms, who verbally acknowledged these results.   Original Report Authenticated By: Marin Roberts, M.D.    Mri Brain Without Contrast  05/05/2012  *RADIOLOGY  REPORT*  Clinical Data:  Left arm weakness.  Hypertension.  MRI BRAIN WITHOUT CONTRAST MRA HEAD WITHOUT CONTRAST  Technique: Multiplanar, multiecho pulse sequences of the brain and surrounding structures were obtained according to standard protocol without intravenous contrast.  Angiographic images of the head were obtained using MRA technique without contrast.  Comparison: 05/04/2012 head CT.  No comparison brain MR.  MRI HEAD  Findings:  Motion degraded exam.  Acute non hemorrhagic right hemispheric infarcts involving portions of the right opercular region, right sub insular region, right frontal lobe and right parietal lobe. Mild mass effect with slight compression of the right lateral ventricle without midline shift.  No intracranial hemorrhage.  No intracranial mass lesion detected on this unenhanced exam.  No hydrocephalus.  Partially empty sella.  Minimal asymmetry of the hippocampi.  IMPRESSION: Acute total right hemispheric infarct as noted above.  MRA HEAD  Findings: Motion degraded exam.  Abrupt cut off left flow at the right carotid terminus with very few right middle cerebral artery branch vessels visualized.  Poor delineation of a majority of the anterior cerebral arteries bilaterally.  Small caliber left internal carotid arteries suggesting proximal stenosis with occlusion or significant narrowing of the left internal carotid artery after the takeoff of the left ophthalmic artery.  Poor delineation of a majority of the left middle cerebral artery branches which appear significantly narrowed irregular.  On source sequence, increased number of vessels in the suprasellar cistern and ambient cistern.  Increased vasculature right occipital lobe.  On FLAIR imaging, altered flow is seen within intracranial vasculature.  These findings may represent result of slow flow from prominent intracranial atherosclerotic type changes and subsequent collateral flow.  Vascular malformation not excluded.  The patient  would benefit from catheter angiography.  Right vertebral artery is dominant.  Irregularity of both vertebral arteries with mild narrowing.  Poor delineation of the PICAs.  Small caliber basilar artery without high-grade stenosis.  Markedly irregular appearance of the superior cerebral arteries and posterior cerebral arteries with poor delineation of the majority of the left posterior cerebral artery.  High-grade stenosis proximal right posterior cerebral artery.  Limited for detection of aneurysm.  IMPRESSION: Markedly abnormal motion degraded MR angiogram of the circle Willis.  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  Small caliber left internal carotid artery suggesting proximal stenosis with occlusion or high-grade stenosis of the left internal carotid artery after takeoff of left ophthalmic artery.  Significant increased vasculature as seen on source imaging may represent result of collateral flow although vascular malformation is not excluded and catheter angiogram of the neck and intracranial vasculature may be considered.  This has been made a PRA call report utilizing dashboard call feature.   Original Report Authenticated By: Lacy Duverney, M.D.    Mr Mra Head/brain Wo Cm  05/05/2012  *RADIOLOGY REPORT*  Clinical Data:  Left arm weakness.  Hypertension.  MRI BRAIN WITHOUT CONTRAST MRA HEAD WITHOUT CONTRAST  Technique: Multiplanar, multiecho pulse sequences of the brain and surrounding structures were obtained according to standard protocol without intravenous contrast.  Angiographic images of the head were obtained using MRA technique without contrast.  Comparison: 05/04/2012 head CT.  No comparison brain MR.  MRI HEAD  Findings:  Motion degraded exam.  Acute non hemorrhagic right hemispheric infarcts involving portions of the right opercular region, right sub insular region, right frontal lobe and right parietal lobe. Mild mass effect with slight  compression of the right lateral ventricle without midline shift.  No intracranial hemorrhage.  No intracranial mass lesion detected on this unenhanced exam.  No hydrocephalus.  Partially empty sella.  Minimal asymmetry of the hippocampi.  IMPRESSION: Acute total right hemispheric infarct as noted above.  MRA HEAD  Findings: Motion degraded exam.  Abrupt cut off left flow at the right carotid terminus with very few right middle cerebral artery branch vessels visualized.  Poor delineation of a majority of the anterior cerebral arteries bilaterally.  Small caliber left internal carotid arteries suggesting proximal stenosis with occlusion or significant narrowing of the left internal carotid artery after the takeoff of the left ophthalmic artery.  Poor delineation of a majority of the left middle cerebral artery branches which appear significantly narrowed irregular.  On source sequence, increased number of vessels in the suprasellar cistern and ambient cistern.  Increased vasculature right occipital lobe.  On FLAIR imaging, altered flow is seen within intracranial vasculature.  These findings may represent result of slow flow from prominent intracranial atherosclerotic type changes and subsequent collateral flow.  Vascular malformation not excluded.  The patient would benefit from catheter angiography.  Right vertebral artery is dominant.  Irregularity of both vertebral arteries with mild narrowing.  Poor delineation of the PICAs.  Small caliber basilar artery without high-grade stenosis.  Markedly irregular appearance of the superior cerebral arteries and posterior cerebral arteries with poor delineation of the majority of the left posterior cerebral artery.  High-grade stenosis proximal right posterior cerebral artery.  Limited for detection of aneurysm.  IMPRESSION: Markedly abnormal motion degraded MR angiogram of the circle Willis.  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  Small caliber left internal carotid artery suggesting proximal stenosis with occlusion or high-grade stenosis of the left internal carotid artery after takeoff of left ophthalmic artery.  Significant increased vasculature as seen on source imaging may represent result of collateral flow although vascular malformation is not excluded and catheter angiogram of the neck and intracranial vasculature may be considered.  This has been made a PRA call report utilizing dashboard call feature.   Original Report Authenticated By: Lacy Duverney, M.D.          Subjective: Patient continues to have visualization's. She feels that she is seeing cats and the wall. Denies auditory hallucinations. Denies any fevers, chills, chest pain, shortness of breath, vomiting, diarrhea, abdominal pain, dysuria. Patient has some nausea.  Objective: Filed Vitals:   05/13/12 0247 05/13/12 0628 05/13/12 1314 05/13/12 1802  BP: 126/89 135/76 128/63 117/76  Pulse: 102 104 94 99  Temp: 98.1 F (36.7 C) 98.2 F (36.8 C) 97.3 F (36.3 C) 98.2 F (36.8 C)  TempSrc: Oral Oral Oral Oral  Resp: 17 18 18 18   Height:      Weight:      SpO2: 100% 98% 97% 98%   No intake or output data in the 24 hours ending 05/13/12 1817 Weight change:  Exam:   General:  Pt is alert, follows commands appropriately, not in acute distress  HEENT: No icterus, No thrush,  [place]/AT  Cardiovascular: RRR, S1/S2, no rubs, no gallops  Respiratory: CTA bilaterally, no wheezing, no crackles, no rhonchi  Abdomen: Soft/+BS, non tender, non distended, no guarding  Extremities: No edema, No lymphangitis, No petechiae, No rashes, no synovitis  Data Reviewed: Basic Metabolic Panel:  Lab 05/13/12 2956 05/11/12 0642 05/10/12 0908 05/09/12 0600 05/08/12 1744  NA 142 137 141 139 139  K 4.5 3.8 3.5 4.4 3.7  CL 104  100 100 101 103  CO2 30 28 29 27 27   GLUCOSE 101* 151* 129* 104* 101*  BUN 10 13 10 12 13   CREATININE 1.01 0.98 1.09  0.99 0.95  CALCIUM 9.6 9.9 9.7 9.6 9.4  MG -- -- -- -- --  PHOS -- -- -- -- --   Liver Function Tests: No results found for this basename: AST:5,ALT:5,ALKPHOS:5,BILITOT:5,PROT:5,ALBUMIN:5 in the last 168 hours No results found for this basename: LIPASE:5,AMYLASE:5 in the last 168 hours No results found for this basename: AMMONIA:5 in the last 168 hours CBC:  Lab 05/07/12 0545  WBC 7.3  NEUTROABS --  HGB 11.8*  HCT 36.5  MCV 90.1  PLT 268   Cardiac Enzymes: No results found for this basename: CKTOTAL:5,CKMB:5,CKMBINDEX:5,TROPONINI:5 in the last 168 hours BNP: No components found with this basename: POCBNP:5 CBG: No results found for this basename: GLUCAP:5 in the last 168 hours  Recent Results (from the past 240 hour(s))  URINE CULTURE     Status: Normal   Collection Time   05/06/12  8:30 PM      Component Value Range Status Comment   Specimen Description URINE, CLEAN CATCH   Final    Special Requests NONE   Final    Culture  Setup Time 05/06/2012 20:54   Final    Colony Count 30,000 COLONIES/ML   Final    Culture     Final    Value: Multiple bacterial morphotypes present, none predominant. Suggest appropriate recollection if clinically indicated.   Report Status 05/08/2012 FINAL   Final      Scheduled Meds:   . aspirin  81 mg Oral Daily  . atorvastatin  20 mg Oral q1800  . citalopram  40 mg Oral Daily  . clonazePAM  1 mg Oral Daily  . clopidogrel  75 mg Oral Q breakfast  . enoxaparin  40 mg Subcutaneous Q24H  . hydrOXYzine  50 mg Oral TID  . lamoTRIgine  200 mg Oral BID  . nicotine  21 mg Transdermal Daily  . OLANZapine  5 mg Oral Daily  . QUEtiapine  150 mg Oral Q2000  . [COMPLETED] QUEtiapine  50 mg Oral Once  . [DISCONTINUED] OLANZapine  10 mg Oral QHS  . [DISCONTINUED] OLANZapine  5 mg Oral QHS  . [DISCONTINUED] QUEtiapine  200 mg Oral Daily   Continuous Infusions:    Ontario Pettengill, DO  [facility] Pager [phone]  If 7PM-7AM, please contact  night-coverage [email or web address] Password [email or web address] 05/13/2012, 6:17 PM   LOS: 9 days

## 2012-05-13 NOTE — Progress Notes (Signed)
Clinical Social Work Progress Note PSYCHIATRY SERVICE LINE 05/13/2012  Patient:  LIRA STEPHEN  Account:  [id number]  Admit Date:  05/04/2012  Clinical Social Worker:  Unk Lightning, LCSW  Date/Time:  05/13/2012 04:30 PM  Review of Patient  Overall Medical Condition:   "My head and legs hurt"   Participation Level:  Active  Participation Quality  Appropriate   Other Participation Quality:   Affect  Flat   Cognitive  Appropriate   Reaction to Medications/Concerns:   Patient reports that she needs a shot of Demerol to help with pain. Patient feels pills will not help. Patient is concerned that she has not received   Modes of Intervention  Support   Summary of Progress/Plan at Discharge   CSW met with patient at bedside. Patient in bed with all lights off and a washcloth on her face. Patient reports her head hurts and asks that no lights be turned on. Patient reports that she is upset with care and is not getting the proper medication. Patient states that pills do not work and she needs shots. Patient states several times that she is not pain seeking.    Patient reports that she is still having hallucinations at times. Patient reports she saw a cat in the wall and it had kittens. Patient feels the RN should cut open the wall and everyone would see the cat. CSW and patient discussed her hallucinations and patient was able to recognize that it was a hallucination instead of reality. Patient agreed to be more aware and to continue to use reality testing. CSW will staff case with psych MD and will continue to follow.

## 2012-05-13 NOTE — Progress Notes (Signed)
Pt complaining of headache,  That her legs are hurting from her knees to her ankles 'shin bone"  Medicated.

## 2012-05-14 LAB — COMPREHENSIVE METABOLIC PANEL
ALT: 35 U/L (ref 0–35)
AST: 33 U/L (ref 0–37)
Albumin: 3.4 g/dL — ABNORMAL LOW (ref 3.5–5.2)
Alkaline Phosphatase: 136 U/L — ABNORMAL HIGH (ref 39–117)
CO2: 27 mEq/L (ref 19–32)
Chloride: 102 mEq/L (ref 96–112)
Creatinine, Ser: 0.91 mg/dL (ref 0.50–1.10)
GFR calc non Af Amer: 74 mL/min — ABNORMAL LOW (ref >90)
Potassium: 5 mEq/L (ref 3.5–5.1)
Total Bilirubin: 0.2 mg/dL — ABNORMAL LOW (ref 0.3–1.2)

## 2012-05-14 LAB — CBC
MCV: 90.2 fL (ref 78.0–100.0)
Platelets: 283 10*3/uL (ref 150–400)
RBC: 3.97 MIL/uL (ref 3.87–5.11)
RDW: 13.6 % (ref 11.5–15.5)
WBC: 6.7 10*3/uL (ref 4.0–10.5)

## 2012-05-14 MED ORDER — QUETIAPINE FUMARATE ER 150 MG PO TB24: 150.0000 mg | ORAL_TABLET | Freq: Every day | ORAL | Status: DC

## 2012-05-14 MED ORDER — CLOPIDOGREL BISULFATE 75 MG PO TABS: 75.0000 mg | ORAL_TABLET | Freq: Every day | ORAL | Status: DC

## 2012-05-14 MED ORDER — UNABLE TO FIND: Status: DC

## 2012-05-14 MED ORDER — PROMETHAZINE HCL 25 MG PO TABS: 25.0000 mg | ORAL_TABLET | Freq: Four times a day (QID) | ORAL | Status: DC | PRN

## 2012-05-14 MED ORDER — ASPIRIN 81 MG PO CHEW: 81.0000 mg | CHEWABLE_TABLET | Freq: Every day | ORAL | Status: AC

## 2012-05-14 MED ORDER — OLANZAPINE 5 MG PO TABS: 5.0000 mg | ORAL_TABLET | Freq: Every day | ORAL | Status: DC

## 2012-05-14 MED ORDER — ATORVASTATIN CALCIUM 20 MG PO TABS: 20.0000 mg | ORAL_TABLET | Freq: Every day | ORAL | Status: AC

## 2012-05-14 NOTE — Discharge Summary (Signed)
Physician Discharge Summary  April [name] RUE:[id number] DOB: 03/25/1966 DOA: 05/04/2012  PCP: No primary provider on file.  Admit date: 05/04/2012 Discharge date: 05/14/2012  Recommendations for Outpatient Follow-up:  1. Pt will need to follow up with PCP in 2 weeks post discharge 2. Followup with neurologist, Dr. Pearlean Brownie in 2 months 3. Followup at [facility], walk-in for psychiatric services in one week  Discharge Diagnoses:  #1 acute nonhemorrhagic right hemispheric infarcts involving portions of the right opercular region, right subinsular region, right frontal lobe, right parietal lobe,  Likely embolic in nature  -Carotid Dopplers negative for ICA stenosis  -2-D echo is negative for any source of emboli  -TEE is negative for thrombus. No PFO or ASD. No source of emboli  -Continue aspirin and Plavix x 3 months  per neurology and then  aspirin daily -Continue Lipitor. Risk factor modification. Tobacco cessation. PT/ OT. Will need to followup with neurology as outpatient.  #2 auditory and visual hallucinations  -history of schizoaffective disorder and bipolar disorder. Patient states had stopped taking her psychiatric medications one month ago prior to admission. Patient is on Zyprexa, Lamictal, Celexa. Patient states STILL having visual hallucinations, but this has returned to baseline.  Patient denies any suicidal or homicidal ideations  -pt has hallucinations at home at baseline  -appreciate psychiatry followup  #3 hyperlipidemia  Lipitor.  #4 schizoaffective disorder/bipolar disorder  - Patient states hasn't taken her medications in a month prior to admission  -psych recommended decreasing zyprexa to 5mg  daily,and started patient on Seroquel 150 mg at 8 PM daily  -Celexa was increased. Continue lamictal  -Will discontinue trazadone for sleep prn. TSH within normal limits. Psychiatry FF.  #5 hyperkalemia  Resolved.  #5 tobacco abuse  Nicotine patch. Tobacco cessation discussed. #6  headache  Likely a migrainous headache versus post CVA headache  -s/p a dose of IV Depakote x1 with some improvement however still with bad headache yesterday  -given IV Decadron IV Compazine and IV Reglan with some improvement. Improvement. Patient is to have Seroquel tonight and hopefully with sleep headache should improve  -Give one-time dose Benadryl and Compazine  Code Status: Full  Family Communication: Updated patient and sister  Consultants:  Neurology: Dr. Roseanne Reno 05/04/2012  Psychiatry Dr. Ferol Luz 05/07/2012 Procedures:  CT head 05/04/2012  MRI head 05/04/2012  2-D echo 05/05/2012  Carotid Dopplers 05/05/2012  TEE 05/06/2012   Discharge Condition: stable  Disposition: d/c home Follow-up Information    Call [facility]). (To assist with transportation to medical appointments)    Contact information:   Call [phone] and complete assessment         Diet:cardiac Wt Readings from Last 3 Encounters:  05/05/12 146.33 kg (322 lb 9.6 oz)  05/05/12 146.33 kg (322 lb 9.6 oz)    History of present illness:  46 year old female who states on Thursday prior to admission, while helping to prepare Thanksgiving dinner, noted she kept dropping things from her left hand. This has persisted, yesterday she dropped a plate of food. She reports no other abnormalities, no slurred speech, no difficulty swallowing, no difficulty walking, no altered mentation. The patient has been off her psych medications approximately a month, she wondered if her symptoms was due to withdrawal. She does have hypertension but she states she did continue take her blood pressure medications. In the ER, the patient's blood pressure is normal. She states at home her blood pressure normally runs slightly elevated with systolic blood pressure in the low 140s. Patient came  to the ER because of the persisting mild weakness of the left upper extremity. History provided by the patient who is alert  and oriented. She doesn't report some headache but no blurred vision no tinnitus   Hospital Course:  Neurology was consulted to see the patient. A full neurologic workup was undertaken. MRI of the brain revealed acute total right hemispheric infarction particularly in the right opercular, right frontal, and right parietal, and right insular regions. Although the MRA of the brain was motion degraded, there was an abrupt occlusion noted on the right internal carotid artery, intracranial portion. There was also a small caliber left internal carotid artery suggesting proximal occlusion or high-grade stenosis. Echocardiogram, both transthoracic and transesophageal did not show any thrombi. Carotid ultrasound was negative for any extracranial stenosis. The patient was not thought to be a TPA candidate at the time of arrival. The patient was initially started on aspirin 325 mg daily. She was transitioned to aspirin 81 mg with Plavix 75 mg daily. Neurology recommended aggressive risk factor modification including tobacco cessation. This was discussed with the patient. Neurology recommended aspirin 81 mg with Plavix 75 mg for 3 months, then continue aspirin 81 mg daily indefinitely. The patient began having visual hallucinations. This prompted a psychiatry consultation. The patient has a history of schizoaffective disorder. The patient had stopped all her medications for one month prior to admission. Psychiatry recommended slow taper of Navane. To start the patient on Seroquel 150 mg at bedtime with a taper of the Zyprexa to 5 mg daily. The patient was continued on hydroxyzine 50 mg 3 times a day. Klonopin was continued at 1 mg daily. Her Celexa was increased to 40 mg daily. She was continued on her Lamictal 200 mg twice a day. The patient was deemed to have capacity to make her decisions. The patient did not have any suicidal ideations. Psychiatry recommended outpatient followup at [facility] for the patient's psychiatric  needs. The patient's visual hallucinations returned to baseline with adjustment of her medications. Inpatient PM&R was consulted, but they felt the patient would be better served at [facility]. After discussion with the patient, the patient refused SNF, and wanted to be discharged home with home health. During the hospitalization, the patient suffered from recurrent headaches. It was thought that part of this was drug seeking behavior. The patient responded moderately to a variety of regimens including Toradol, Depakote, and Decadron with Reglan and Compazine. She did not have any further neurologic complications. Neurology recommended followup at the stroke clinic in 2 months.  Consultants: Neurology, Dr. Noel Christmas Psychiatry, Dr. Ferol Luz  Discharge Exam: Filed Vitals:   05/14/12 1000  BP: 115/67  Pulse: 89  Temp: 97.5 F (36.4 C)  Resp: 18   Filed Vitals:   05/13/12 1802 05/13/12 2141 05/14/12 0515 05/14/12 1000  BP: 117/76 122/70 126/75 115/67  Pulse: 99 98 88 89  Temp: 98.2 F (36.8 C) 98.7 F (37.1 C) 98.3 F (36.8 C) 97.5 F (36.4 C)  TempSrc: Oral Oral Oral Oral  Resp: 18 18 18 18   Height:      Weight:      SpO2: 98% 98% 98% 99%   General: A&O x 3, NAD, pleasant, cooperative Cardiovascular: RRR, no rub, no gallop, no S3 Respiratory: CTAB, no wheeze, no rhonchi Abdomen:soft, nontender, nondistended, positive bowel sounds Extremities: No edema, No lymphangitis, no petechiae  Discharge Instructions      Discharge Orders    Future Orders Please Complete By Expires   Diet -  low sodium heart healthy      Increase activity slowly      Discharge instructions      Comments:   Aspirin 81 mg once a day AND Plavix 75mg  once a day for 3 months. After 3 months, take aspirin 81mg  once a day indefinitely. Follow up at [facility] for psychiatric care. Follow up with neurology in one month.       Medication List     As of 05/14/2012  1:01 PM    STOP taking these medications          cloNIDine 0.1 MG tablet   Commonly known as: CATAPRES      lithium carbonate 300 MG capsule      TAKE these medications         aspirin 81 MG chewable tablet   Chew 1 tablet (81 mg total) by mouth daily.      atorvastatin 20 MG tablet   Commonly known as: LIPITOR   Take 1 tablet (20 mg total) by mouth daily at 6 PM.      citalopram 20 MG tablet   Commonly known as: CELEXA   Take 30 mg by mouth daily.      clonazePAM 1 MG tablet   Commonly known as: KLONOPIN   Take 1.5 mg by mouth daily. Scheduled dose      clopidogrel 75 MG tablet   Commonly known as: PLAVIX   Take 1 tablet (75 mg total) by mouth daily with breakfast.      hydrOXYzine 50 MG tablet   Commonly known as: ATARAX/VISTARIL   Take 50 mg by mouth 3 (three) times daily. Scheduled doses      lamoTRIgine 200 MG tablet   Commonly known as: LAMICTAL   Take 200 mg by mouth 2 (two) times daily.      OLANZapine 5 MG tablet   Commonly known as: ZYPREXA   Take 1 tablet (5 mg total) by mouth daily.      promethazine 25 MG tablet   Commonly known as: PHENERGAN   Take 1 tablet (25 mg total) by mouth every 6 (six) hours as needed for nausea.      QUEtiapine Fumarate 150 MG 24 hr tablet   Commonly known as: SEROQUEL XR   Take 1 tablet (150 mg total) by mouth daily at 8 pm.      thiothixene 2 MG capsule   Commonly known as: NAVANE   Take 2 mg by mouth 2 (two) times daily.      UNABLE TO FIND   Ms. Dilpreet Hosick was admitted and treated for her illness at [facility] between 05/04/12 and 05/14/12.           The results of significant diagnostics from this hospitalization (including imaging, microbiology, ancillary and laboratory) are listed below for reference.    Significant Diagnostic Studies: Ct Head Wo Contrast  05/04/2012  *RADIOLOGY REPORT*  Clinical Data: Weakness.  Unable views left arm.  CT HEAD WITHOUT CONTRAST  Technique:  Contiguous axial images were obtained from the base of  the skull through the vertex without contrast.  Comparison: CT head without contrast 08/09/2007.  Findings: Focal hypoattenuation involves the anterior insular cortex and frontal operculum.  There is no associated hemorrhage. There is focal sulcal effacement.  No other focal cortical infarct is evident.  Scattered subcortical white matter hypoattenuation is present bilaterally.  The ventricles are of normal size.  No significant extra-axial fluid collection is present.  The  paranasal sinuses and mastoid air cells are clear.  The osseous skull is intact.  IMPRESSION:  1.  Interval right MCA territory infarct involving the insular cortex and inferior right frontal operculum.  This is likely acute. 2.  There is some local mass effect without significant midline shift. 3.  Scattered subcortical white matter hypoattenuation bilaterally likely reflects the sequelae of chronic microvascular ischemia.  These results were called by telephone on 05/04/2012 at 03:45 p.m. to Dr. Ranae Palms, who verbally acknowledged these results.   Original Report Authenticated By: Marin Roberts, M.D.    Mri Brain Without Contrast  05/05/2012  *RADIOLOGY REPORT*  Clinical Data:  Left arm weakness.  Hypertension.  MRI BRAIN WITHOUT CONTRAST MRA HEAD WITHOUT CONTRAST  Technique: Multiplanar, multiecho pulse sequences of the brain and surrounding structures were obtained according to standard protocol without intravenous contrast.  Angiographic images of the head were obtained using MRA technique without contrast.  Comparison: 05/04/2012 head CT.  No comparison brain MR.  MRI HEAD  Findings:  Motion degraded exam.  Acute non hemorrhagic right hemispheric infarcts involving portions of the right opercular region, right sub insular region, right frontal lobe and right parietal lobe. Mild mass effect with slight compression of the right lateral ventricle without midline shift.  No intracranial hemorrhage.  No intracranial mass lesion  detected on this unenhanced exam.  No hydrocephalus.  Partially empty sella.  Minimal asymmetry of the hippocampi.  IMPRESSION: Acute total right hemispheric infarct as noted above.  MRA HEAD  Findings: Motion degraded exam.  Abrupt cut off left flow at the right carotid terminus with very few right middle cerebral artery branch vessels visualized.  Poor delineation of a majority of the anterior cerebral arteries bilaterally.  Small caliber left internal carotid arteries suggesting proximal stenosis with occlusion or significant narrowing of the left internal carotid artery after the takeoff of the left ophthalmic artery.  Poor delineation of a majority of the left middle cerebral artery branches which appear significantly narrowed irregular.  On source sequence, increased number of vessels in the suprasellar cistern and ambient cistern.  Increased vasculature right occipital lobe.  On FLAIR imaging, altered flow is seen within intracranial vasculature.  These findings may represent result of slow flow from prominent intracranial atherosclerotic type changes and subsequent collateral flow.  Vascular malformation not excluded.  The patient would benefit from catheter angiography.  Right vertebral artery is dominant.  Irregularity of both vertebral arteries with mild narrowing.  Poor delineation of the PICAs.  Small caliber basilar artery without high-grade stenosis.  Markedly irregular appearance of the superior cerebral arteries and posterior cerebral arteries with poor delineation of the majority of the left posterior cerebral artery.  High-grade stenosis proximal right posterior cerebral artery.  Limited for detection of aneurysm.  IMPRESSION: Markedly abnormal motion degraded MR angiogram of the circle Willis.  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  Small caliber left internal carotid artery suggesting proximal stenosis with occlusion or high-grade stenosis  of the left internal carotid artery after takeoff of left ophthalmic artery.  Significant increased vasculature as seen on source imaging may represent result of collateral flow although vascular malformation is not excluded and catheter angiogram of the neck and intracranial vasculature may be considered.  This has been made a PRA call report utilizing dashboard call feature.   Original Report Authenticated By: Lacy Duverney, M.D.    Mr Mra Head/brain Wo Cm  05/05/2012  *RADIOLOGY REPORT*  Clinical  Data:  Left arm weakness.  Hypertension.  MRI BRAIN WITHOUT CONTRAST MRA HEAD WITHOUT CONTRAST  Technique: Multiplanar, multiecho pulse sequences of the brain and surrounding structures were obtained according to standard protocol without intravenous contrast.  Angiographic images of the head were obtained using MRA technique without contrast.  Comparison: 05/04/2012 head CT.  No comparison brain MR.  MRI HEAD  Findings:  Motion degraded exam.  Acute non hemorrhagic right hemispheric infarcts involving portions of the right opercular region, right sub insular region, right frontal lobe and right parietal lobe. Mild mass effect with slight compression of the right lateral ventricle without midline shift.  No intracranial hemorrhage.  No intracranial mass lesion detected on this unenhanced exam.  No hydrocephalus.  Partially empty sella.  Minimal asymmetry of the hippocampi.  IMPRESSION: Acute total right hemispheric infarct as noted above.  MRA HEAD  Findings: Motion degraded exam.  Abrupt cut off left flow at the right carotid terminus with very few right middle cerebral artery branch vessels visualized.  Poor delineation of a majority of the anterior cerebral arteries bilaterally.  Small caliber left internal carotid arteries suggesting proximal stenosis with occlusion or significant narrowing of the left internal carotid artery after the takeoff of the left ophthalmic artery.  Poor delineation of a majority of the  left middle cerebral artery branches which appear significantly narrowed irregular.  On source sequence, increased number of vessels in the suprasellar cistern and ambient cistern.  Increased vasculature right occipital lobe.  On FLAIR imaging, altered flow is seen within intracranial vasculature.  These findings may represent result of slow flow from prominent intracranial atherosclerotic type changes and subsequent collateral flow.  Vascular malformation not excluded.  The patient would benefit from catheter angiography.  Right vertebral artery is dominant.  Irregularity of both vertebral arteries with mild narrowing.  Poor delineation of the PICAs.  Small caliber basilar artery without high-grade stenosis.  Markedly irregular appearance of the superior cerebral arteries and posterior cerebral arteries with poor delineation of the majority of the left posterior cerebral artery.  High-grade stenosis proximal right posterior cerebral artery.  Limited for detection of aneurysm.  IMPRESSION: Markedly abnormal motion degraded MR angiogram of the circle Willis.  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  Small caliber left internal carotid artery suggesting proximal stenosis with occlusion or high-grade stenosis of the left internal carotid artery after takeoff of left ophthalmic artery.  Significant increased vasculature as seen on source imaging may represent result of collateral flow although vascular malformation is not excluded and catheter angiogram of the neck and intracranial vasculature may be considered.  This has been made a PRA call report utilizing dashboard call feature.   Original Report Authenticated By: Lacy Duverney, M.D.      Microbiology: Recent Results (from the past 240 hour(s))  URINE CULTURE     Status: Normal   Collection Time   05/06/12  8:30 PM      Component Value Range Status Comment   Specimen Description URINE, CLEAN CATCH   Final     Special Requests NONE   Final    Culture  Setup Time 05/06/2012 20:54   Final    Colony Count 30,000 COLONIES/ML   Final    Culture     Final    Value: Multiple bacterial morphotypes present, none predominant. Suggest appropriate recollection if clinically indicated.   Report Status 05/08/2012 FINAL   Final      Labs: Basic  Metabolic Panel:  Lab 05/14/12 1610 05/13/12 0545 05/11/12 0642 05/10/12 0908 05/09/12 0600  NA 139 142 137 141 139  K 5.0 4.5 -- -- --  CL 102 104 100 100 101  CO2 27 30 28 29 27   GLUCOSE 107* 101* 151* 129* 104*  BUN 10 10 13 10 12   CREATININE 0.91 1.01 0.98 1.09 0.99  CALCIUM 9.1 9.6 9.9 9.7 9.6  MG -- -- -- -- --  PHOS -- -- -- -- --   Liver Function Tests:  Lab 05/14/12 0455  AST 33  ALT 35  ALKPHOS 136*  BILITOT 0.2*  PROT 6.7  ALBUMIN 3.4*   No results found for this basename: LIPASE:5,AMYLASE:5 in the last 168 hours No results found for this basename: AMMONIA:5 in the last 168 hours CBC:  Lab 05/14/12 0455  WBC 6.7  NEUTROABS --  HGB 11.8*  HCT 35.8*  MCV 90.2  PLT 283   Cardiac Enzymes: No results found for this basename: CKTOTAL:5,CKMB:5,CKMBINDEX:5,TROPONINI:5 in the last 168 hours BNP: No components found with this basename: POCBNP:5 CBG: No results found for this basename: GLUCAP:5 in the last 168 hours  Time coordinating discharge:  Greater than 30 minutes  Signed:  Shanitra Phillippi, DO [facility] Pager: [phone] 05/14/2012, 1:01 PM

## 2012-05-20 ENCOUNTER — Emergency Department (HOSPITAL_COMMUNITY)
Admission: EM | Admit: 2012-05-20 | Discharge: 2012-05-20 | Disposition: A | Discharge status: Home / Self Care | Payer: Medicaid Other | Attending: Emergency Medicine | Admitting: Emergency Medicine

## 2012-05-20 ENCOUNTER — Encounter (HOSPITAL_COMMUNITY): Payer: Self-pay

## 2012-05-20 DIAGNOSIS — I10 Essential (primary) hypertension: Secondary | ICD-10-CM | POA: Insufficient documentation

## 2012-05-20 DIAGNOSIS — Z7982 Long term (current) use of aspirin: Secondary | ICD-10-CM | POA: Insufficient documentation

## 2012-05-20 DIAGNOSIS — Z79899 Other long term (current) drug therapy: Secondary | ICD-10-CM | POA: Insufficient documentation

## 2012-05-20 DIAGNOSIS — F209 Schizophrenia, unspecified: Secondary | ICD-10-CM | POA: Insufficient documentation

## 2012-05-20 DIAGNOSIS — F329 Major depressive disorder, single episode, unspecified: Secondary | ICD-10-CM | POA: Insufficient documentation

## 2012-05-20 DIAGNOSIS — Z8673 Personal history of transient ischemic attack (TIA), and cerebral infarction without residual deficits: Secondary | ICD-10-CM | POA: Insufficient documentation

## 2012-05-20 DIAGNOSIS — E785 Hyperlipidemia, unspecified: Secondary | ICD-10-CM | POA: Insufficient documentation

## 2012-05-20 DIAGNOSIS — F17200 Nicotine dependence, unspecified, uncomplicated: Secondary | ICD-10-CM | POA: Insufficient documentation

## 2012-05-20 DIAGNOSIS — F319 Bipolar disorder, unspecified: Secondary | ICD-10-CM | POA: Insufficient documentation

## 2012-05-20 DIAGNOSIS — M6281 Muscle weakness (generalized): Secondary | ICD-10-CM | POA: Insufficient documentation

## 2012-05-20 HISTORY — DX: Cerebral infarction, unspecified: I63.9

## 2012-05-20 LAB — CBC WITH DIFFERENTIAL/PLATELET
Basophils Absolute: 0.1 10*3/uL (ref 0.0–0.1)
Basophils Relative: 1 % (ref 0–1)
Eosinophils Absolute: 0.1 10*3/uL (ref 0.0–0.7)
Eosinophils Relative: 1 % (ref 0–5)
HCT: 37.1 % (ref 36.0–46.0)
Hemoglobin: 12.2 g/dL (ref 12.0–15.0)
Lymphocytes Relative: 47 % — ABNORMAL HIGH (ref 12–46)
Lymphs Abs: 3.5 10*3/uL (ref 0.7–4.0)
MCH: 29.2 pg (ref 26.0–34.0)
MCHC: 32.9 g/dL (ref 30.0–36.0)
MCV: 88.8 fL (ref 78.0–100.0)
Monocytes Absolute: 0.4 10*3/uL (ref 0.1–1.0)
Monocytes Relative: 5 % (ref 3–12)
Neutro Abs: 3.5 10*3/uL (ref 1.7–7.7)
Neutrophils Relative %: 46 % (ref 43–77)
Platelets: 338 10*3/uL (ref 150–400)
RBC: 4.18 MIL/uL (ref 3.87–5.11)
RDW: 13.2 % (ref 11.5–15.5)
WBC: 7.5 10*3/uL (ref 4.0–10.5)

## 2012-05-20 LAB — BASIC METABOLIC PANEL
BUN: 15 mg/dL (ref 6–23)
CO2: 25 mEq/L (ref 19–32)
Calcium: 9.9 mg/dL (ref 8.4–10.5)
Chloride: 105 mEq/L (ref 96–112)
Creatinine, Ser: 0.8 mg/dL (ref 0.50–1.10)
GFR calc Af Amer: >90 mL/min (ref >90)
GFR calc non Af Amer: 86 mL/min — ABNORMAL LOW (ref >90)
Glucose, Bld: 107 mg/dL — ABNORMAL HIGH (ref 70–99)
Potassium: 3.9 mEq/L (ref 3.5–5.1)
Sodium: 140 mEq/L (ref 135–145)

## 2012-05-20 NOTE — ED Notes (Signed)
Per [facility], pt was at store and started having left arm weakness around 1630 and was having trouble holding a bag. Once the pt got home and called 911. When fire arrived she had slurred speech, facial droop on left side tremors to the left hand. Pt c/o slight HA. 24g to [facility]

## 2012-05-20 NOTE — ED Provider Notes (Signed)
History     CSN: [id number]  Arrival date & time 05/20/12  1846   First MD Initiated Contact with Patient 05/20/12 1854      Chief Complaint  Patient presents with  . Stroke Symptoms    (Consider location/radiation/quality/duration/timing/severity/associated sxs/prior treatment) HPI The patient with a past medical history of CVA presents with Left sided weakness.  She reports weakness onset with CVA.  She reports intermittent twitching" in L Upper extremity.   She reports dropping her bags in [employer] today and her friend called EMS.  A neighbor reported L sided facial droop.  She reports an intermittent HA since CVA.  Denies LOC, fall, or blow to head.  She reports compliance with her Clopidogrel. She reports decrease in short term memory.   Past Medical History  Diagnosis Date  . Hypertension   . Bipolar 1 disorder   . Schizo affective schizophrenia   . Depression, major   . Hyperlipidemia 05/06/2012  . Tobacco abuse 05/06/2012  . Depression 05/06/2012  . Stroke     05/02/12    Past Surgical History  Procedure Date  . Cholecystectomy   . Abdominal hysterectomy   . Tee without cardioversion 05/06/2012    Procedure: TRANSESOPHAGEAL ECHOCARDIOGRAM (TEE);  Surgeon: Laurey Morale, MD;  Location: [facility] ENDOSCOPY;  Service: Cardiovascular;  Laterality: N/A;    No family history on file.  History  Substance Use Topics  . Smoking status: Current Every Day Smoker  . Smokeless tobacco: Not on file  . Alcohol Use: Yes     Comment: ocassional    OB History    Grav Para Term Preterm Abortions TAB SAB Ect Mult Living                  Review of Systems All other systems negative except as documented in the HPI. All pertinent positives and negatives as reviewed in the HPI.  Allergies  Morphine and related  Home Medications   Current Outpatient Rx  Name  Route  Sig  Dispense  Refill  . ASPIRIN 81 MG PO CHEW   Oral   Chew 1 tablet (81 mg total) by mouth daily.          . ATORVASTATIN CALCIUM 20 MG PO TABS   Oral   Take 1 tablet (20 mg total) by mouth daily at 6 PM.   30 tablet   2   . CITALOPRAM HYDROBROMIDE 20 MG PO TABS   Oral   Take 30 mg by mouth daily.         Marland Kitchen CLOPIDOGREL BISULFATE 75 MG PO TABS   Oral   Take 1 tablet (75 mg total) by mouth daily with breakfast.   30 tablet   2   . METOPROLOL SUCCINATE ER 50 MG PO TB24   Oral   Take 50 mg by mouth daily. Take with or immediately following a meal.         . OLANZAPINE 5 MG PO TABS   Oral   Take 1 tablet (5 mg total) by mouth daily.   30 tablet   2   . QUETIAPINE FUMARATE ER 150 MG PO TB24   Oral   Take 1 tablet (150 mg total) by mouth daily at 8 pm.   30 each   2   . PROMETHAZINE HCL 25 MG PO TABS   Oral   Take 1 tablet (25 mg total) by mouth every 6 (six) hours as needed for nausea.   10 tablet  0   . UNABLE TO FIND      Ms. Wretha Grell was admitted and treated for her illness at [facility] between 05/04/12 and 05/14/12.   1 Units   0     BP 106/65  Pulse 75  Temp 98.4 F (36.9 C) (Oral)  Resp 16  Ht 5\' 3"  (1.6 m)  Wt 193 lb (87.544 kg)  BMI 34.19 kg/m2  SpO2 100%  Physical Exam  Nursing note and vitals reviewed. Constitutional: She is oriented to person, place, and time. She appears well-developed and well-nourished. She is cooperative. No distress.  HENT:  Head: Normocephalic and atraumatic.  Mouth/Throat: Oropharynx is clear and moist.  Eyes: EOM are normal. Pupils are equal, round, and reactive to light.  Neck: Normal range of motion. Neck supple.  Cardiovascular: Normal rate, regular rhythm and normal heart sounds.  Exam reveals no gallop and no friction rub.   No murmur heard. Pulmonary/Chest: Effort normal and breath sounds normal. No respiratory distress.  Neurological: She is alert and oriented to person, place, and time. No cranial nerve deficit.       Decrease L Upper extremity strength and Left lower extremity strength    Skin: Skin is warm and dry.     ED Course  Procedures (including critical care time) I spent an extensive amount of time with the patient and that she does have a stroke and that she will have residual symptoms until her therapies complete.  I advised her that when she was exerting herself and fatigue.  Her body today that her symptoms worsened, and that is a common finding.  The patient, states she did notice when she gets fatigued her symptoms worsen.  Patient, is advised followup with her primary care Dr. advised to return here for any worsening in her condition.   MDM          Carlyle Dolly, PA-C 05/22/12 0041

## 2012-05-20 NOTE — ED Notes (Signed)
PA student at the bedside.  

## 2012-05-22 NOTE — ED Provider Notes (Signed)
Medical screening examination/treatment/procedure(s) were performed by non-physician practitioner and as supervising physician I was immediately available for consultation/collaboration.   Charles B. Bernette Mayers, MD 05/22/12 [phone]

## 2012-09-01 ENCOUNTER — Other Ambulatory Visit: Payer: Self-pay

## 2012-09-01 DIAGNOSIS — Z1231 Encounter for screening mammogram for malignant neoplasm of breast: Secondary | ICD-10-CM

## 2012-09-14 ENCOUNTER — Ambulatory Visit: Payer: Medicaid Other | Admitting: Neurology

## 2012-09-22 ENCOUNTER — Ambulatory Visit
Admission: RE | Admit: 2012-09-22 | Discharge: 2012-09-22 | Disposition: A | Discharge status: Home / Self Care | Payer: Medicaid Other | Source: Ambulatory Visit

## 2012-09-22 DIAGNOSIS — Z1231 Encounter for screening mammogram for malignant neoplasm of breast: Secondary | ICD-10-CM

## 2012-10-06 ENCOUNTER — Encounter: Payer: Self-pay | Admitting: Neurology

## 2012-10-06 ENCOUNTER — Ambulatory Visit (INDEPENDENT_AMBULATORY_CARE_PROVIDER_SITE_OTHER): Payer: Medicaid Other | Admitting: Neurology

## 2012-10-06 VITALS — BP 124/87 | HR 88 | Ht 61.0 in | Wt 214.0 lb

## 2012-10-06 DIAGNOSIS — F329 Major depressive disorder, single episode, unspecified: Secondary | ICD-10-CM | POA: Insufficient documentation

## 2012-10-06 DIAGNOSIS — I639 Cerebral infarction, unspecified: Secondary | ICD-10-CM | POA: Insufficient documentation

## 2012-10-06 DIAGNOSIS — F319 Bipolar disorder, unspecified: Secondary | ICD-10-CM

## 2012-10-06 DIAGNOSIS — I6350 Cerebral infarction due to unspecified occlusion or stenosis of unspecified cerebral artery: Secondary | ICD-10-CM

## 2012-10-06 NOTE — Progress Notes (Signed)
Reason for visit: Cerebrovascular disease  April [name] is a 47 y.o. female  History of present illness:  April [name] is a 47 year old right-handed white female with a history of bipolar disorder. The patient sustained a right brain stroke that occurred in December of 2013. 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 The patient had a TEE that was unremarkable. The patient was placed on aspirin and Plavix initially, with plans to convert to aspirin alone in 3 months. The patient has not had any recurrence of numbness or weakness. The patient however, began having episodes of left upper extremity jerking and twitching that began about 6 weeks after admission. The patient however, has no longer had any further events. The patient was placed on Depakote and gabapentin for her bipolar disorder approximately 2 months ago, and the episodes of jerking have disappeared. The patient has an occasional headache, and she will occasionally have some numbness of the left hand. The patient drives a car, but she does not do this frequently. The patient has had some swelling of the feet on the gabapentin. The patient returns for an evaluation.  Past Medical History  Diagnosis Date  . Hypertension   . Bipolar 1 disorder   . Schizo affective schizophrenia   . Depression, major   . Hyperlipidemia 05/06/2012  . Tobacco abuse 05/06/2012  . Depression 05/06/2012  . Stroke     05/02/12  . Obesity     Past Surgical History  Procedure Laterality Date  . Cholecystectomy    . Abdominal hysterectomy    . Tee without cardioversion  05/06/2012    Procedure: TRANSESOPHAGEAL ECHOCARDIOGRAM (TEE);  Surgeon: Laurey Morale, MD;  Location: [facility] ENDOSCOPY;  Service: Cardiovascular;  Laterality: N/A;    Family History  Problem Relation Age of Onset  . Multiple sclerosis Sister   . COPD Sister   . COPD  Sister   . Anxiety disorder Brother   . COPD Mother   . COPD Father     Social history:  reports that she has been smoking Cigarettes.  She has been smoking about 0.50 packs per day. She has never used smokeless tobacco. She reports that she uses illicit drugs (Marijuana). She reports that she does not drink alcohol.  Medications:  Current Outpatient Prescriptions on File Prior to Visit  Medication Sig Dispense Refill  . aspirin 81 MG chewable tablet Chew 1 tablet (81 mg total) by mouth daily.      Marland Kitchen atorvastatin (LIPITOR) 20 MG tablet Take 1 tablet (20 mg total) by mouth daily at 6 PM.  30 tablet  2  . citalopram (CELEXA) 20 MG tablet Take 30 mg by mouth daily.      . metoprolol succinate (TOPROL-XL) 50 MG 24 hr tablet Take 50 mg by mouth daily. Take with or immediately following a meal.      . OLANZapine (ZYPREXA) 5 MG tablet Take 1 tablet (5 mg total) by mouth daily.  30 tablet  2  . QUEtiapine (SEROQUEL XR) 150 MG 24 hr tablet Take 1 tablet (150 mg total) by mouth daily at 8 pm.  30 each  2  . UNABLE TO FIND April [name] was admitted and treated for her illness at [facility] between 05/04/12 and 05/14/12.  1 Units  0   No current facility-administered medications on file prior to visit.  Allergies:  Allergies  Allergen Reactions  . Morphine And Related Hives and Itching    ROS:  Out of a complete 14 system review of symptoms, the patient complains only of the following symptoms, and all other reviewed systems are negative.  Weight gain Easy bruising Joint pain, muscle cramps, achy muscles Memory loss Depression, anxiety, not enough sleep, racing thoughts  Blood pressure 124/87, pulse 88, height 5\' 1"  (1.549 m), weight 214 lb (97.07 kg).  Physical Exam  General: The patient is alert and cooperative at the time of the examination. The patient is moderately obese.  Head: Pupils are equal, round, and reactive to light. Discs are flat  bilaterally.  Neck: The neck is supple, no carotid bruits are noted.  Respiratory: The respiratory examination is clear.  Cardiovascular: The cardiovascular examination reveals a regular rate and rhythm, no obvious murmurs or rubs are noted.  Skin: Extremities are with 1-2+ edema at the ankles bilaterally.  Neurologic Exam  Mental status:  Cranial nerves: Facial symmetry is present. There is good sensation of the face to pinprick and soft touch bilaterally. The strength of the facial muscles and the muscles to head turning and shoulder shrug are normal bilaterally. Speech is well enunciated, no aphasia or dysarthria is noted. Extraocular movements are full. Visual fields are full.  Motor: The motor testing reveals 5 over 5 strength of all 4 extremities. Good symmetric motor tone is noted throughout.  Sensory: Sensory testing is intact to pinprick, soft touch, and vibration sensation on all 4 extremities. No evidence of extinction is noted.  Coordination: Cerebellar testing reveals good finger-nose-finger and heel-to-shin bilaterally.  Gait and station: Gait is normal. Tandem gait is slightly unsteady. Romberg is negative. No drift is seen.  Reflexes: Deep tendon reflexes are symmetric and normal bilaterally. Toes are downgoing bilaterally.   Assessment/Plan:  1. Cerebrovascular disease, right brain stroke  2. Bipolar disorder  3. Episodic left arm jerking, possible seizures  The patient may be having seizures following the stroke, but she went on Depakote and gabapentin for her bipolar disorder, and this appears to have controlled the events. The patient will be set up for an EEG study. The patient will be converted to aspirin alone, and she will go off the Plavix. The patient will otherwise followup through this office if needed. The patient has minimal deficits following her stroke.  Marlan Palau MD 10/06/2012 9:08 PM  [facility] [address]  [address] [place], [place] [address]  Phone [phone] Fax [phone]

## 2012-11-11 ENCOUNTER — Ambulatory Visit (INDEPENDENT_AMBULATORY_CARE_PROVIDER_SITE_OTHER): Payer: Medicaid Other | Admitting: Radiology

## 2012-11-11 ENCOUNTER — Telehealth: Payer: Self-pay | Admitting: Neurology

## 2012-11-11 DIAGNOSIS — F329 Major depressive disorder, single episode, unspecified: Secondary | ICD-10-CM

## 2012-11-11 DIAGNOSIS — F319 Bipolar disorder, unspecified: Secondary | ICD-10-CM

## 2012-11-11 DIAGNOSIS — I6350 Cerebral infarction due to unspecified occlusion or stenosis of unspecified cerebral artery: Secondary | ICD-10-CM

## 2012-11-11 DIAGNOSIS — I639 Cerebral infarction, unspecified: Secondary | ICD-10-CM

## 2012-11-11 NOTE — Procedures (Signed)
  History:  April [name] is a 47 year old patient with a history of bipolar disorder. The patient sustained a right brain stroke in December 2013. The patient has had episodes of left arm twitching that began 6 weeks following her hospitalization for her stroke. The patient is being evaluated for possible seizures.  This is a routine EEG. No skull defects are noted. Medications include aspirin, Lipitor, Celexa, Depakote, gabapentin, metoprolol, Zyprexa, Seroquel, and Invega.  EEG classification: Normal awake  Description of the recording: The background rhythms of this recording consists of a fairly well modulated medium amplitude alpha rhythm of 9 Hz that is reactive to eye opening and closure. As the record progresses, the patient appears to remain in the waking state throughout the recording. Photic stimulation was performed, resulting in a bilateral and symmetric photic driving response. Hyperventilation was not performed. At no time during the recording does there appear to be evidence of spike or spike wave discharges or evidence of focal slowing. EKG monitor shows no evidence of cardiac rhythm abnormalities with a heart rate of 90.  Impression: This is a normal EEG recording in the waking state. No evidence of ictal or interictal discharges are seen.

## 2012-11-11 NOTE — Telephone Encounter (Signed)
I called the patient. The EEG study was unremarkable. The patient still may have seizures given the history the right brain stroke and left sided jerking. The patient is on Depakote and gabapentin for her bipolar disorder, but this will also treat seizures.

## 2013-02-18 ENCOUNTER — Encounter (HOSPITAL_BASED_OUTPATIENT_CLINIC_OR_DEPARTMENT_OTHER): Payer: Self-pay | Admitting: *Deleted

## 2013-02-18 ENCOUNTER — Emergency Department (HOSPITAL_BASED_OUTPATIENT_CLINIC_OR_DEPARTMENT_OTHER)
Admission: EM | Admit: 2013-02-18 | Discharge: 2013-02-18 | Disposition: A | Discharge status: Home / Self Care | Payer: Medicaid Other | Attending: Emergency Medicine | Admitting: Emergency Medicine

## 2013-02-18 ENCOUNTER — Emergency Department (HOSPITAL_BASED_OUTPATIENT_CLINIC_OR_DEPARTMENT_OTHER): Payer: Medicaid Other

## 2013-02-18 DIAGNOSIS — G43909 Migraine, unspecified, not intractable, without status migrainosus: Secondary | ICD-10-CM

## 2013-02-18 DIAGNOSIS — F259 Schizoaffective disorder, unspecified: Secondary | ICD-10-CM | POA: Insufficient documentation

## 2013-02-18 DIAGNOSIS — F17200 Nicotine dependence, unspecified, uncomplicated: Secondary | ICD-10-CM | POA: Insufficient documentation

## 2013-02-18 DIAGNOSIS — R0602 Shortness of breath: Secondary | ICD-10-CM | POA: Insufficient documentation

## 2013-02-18 DIAGNOSIS — R110 Nausea: Secondary | ICD-10-CM | POA: Insufficient documentation

## 2013-02-18 DIAGNOSIS — Z8673 Personal history of transient ischemic attack (TIA), and cerebral infarction without residual deficits: Secondary | ICD-10-CM | POA: Insufficient documentation

## 2013-02-18 DIAGNOSIS — Z7982 Long term (current) use of aspirin: Secondary | ICD-10-CM | POA: Insufficient documentation

## 2013-02-18 DIAGNOSIS — I10 Essential (primary) hypertension: Secondary | ICD-10-CM | POA: Insufficient documentation

## 2013-02-18 DIAGNOSIS — F411 Generalized anxiety disorder: Secondary | ICD-10-CM | POA: Insufficient documentation

## 2013-02-18 DIAGNOSIS — E669 Obesity, unspecified: Secondary | ICD-10-CM | POA: Insufficient documentation

## 2013-02-18 DIAGNOSIS — F314 Bipolar disorder, current episode depressed, severe, without psychotic features: Secondary | ICD-10-CM | POA: Insufficient documentation

## 2013-02-18 DIAGNOSIS — E785 Hyperlipidemia, unspecified: Secondary | ICD-10-CM | POA: Insufficient documentation

## 2013-02-18 DIAGNOSIS — R0789 Other chest pain: Secondary | ICD-10-CM | POA: Insufficient documentation

## 2013-02-18 DIAGNOSIS — Z79899 Other long term (current) drug therapy: Secondary | ICD-10-CM | POA: Insufficient documentation

## 2013-02-18 DIAGNOSIS — R5381 Other malaise: Secondary | ICD-10-CM | POA: Insufficient documentation

## 2013-02-18 DIAGNOSIS — IMO0001 Reserved for inherently not codable concepts without codable children: Secondary | ICD-10-CM | POA: Insufficient documentation

## 2013-02-18 LAB — CBC WITH DIFFERENTIAL/PLATELET
Basophils Absolute: 0.1 10*3/uL (ref 0.0–0.1)
Basophils Relative: 1 % (ref 0–1)
Eosinophils Absolute: 0.1 10*3/uL (ref 0.0–0.7)
Eosinophils Relative: 1 % (ref 0–5)
Lymphs Abs: 3.4 10*3/uL (ref 0.7–4.0)
MCH: 30.1 pg (ref 26.0–34.0)
MCV: 89.5 fL (ref 78.0–100.0)
Neutrophils Relative %: 48 % (ref 43–77)
Platelets: 261 10*3/uL (ref 150–400)
RBC: 4.48 MIL/uL (ref 3.87–5.11)
RDW: 13.1 % (ref 11.5–15.5)
WBC: 8.3 10*3/uL (ref 4.0–10.5)

## 2013-02-18 LAB — COMPREHENSIVE METABOLIC PANEL
ALT: 50 U/L — ABNORMAL HIGH (ref 0–35)
AST: 27 U/L (ref 0–37)
Albumin: 4.2 g/dL (ref 3.5–5.2)
Alkaline Phosphatase: 173 U/L — ABNORMAL HIGH (ref 39–117)
Calcium: 9.9 mg/dL (ref 8.4–10.5)
GFR calc Af Amer: 86 mL/min — ABNORMAL LOW (ref >90)
Glucose, Bld: 108 mg/dL — ABNORMAL HIGH (ref 70–99)
Potassium: 4.1 mEq/L (ref 3.5–5.1)
Sodium: 139 mEq/L (ref 135–145)
Total Protein: 7.6 g/dL (ref 6.0–8.3)

## 2013-02-18 MED ORDER — SODIUM CHLORIDE 0.9 % IV BOLUS (SEPSIS)
1000.0000 mL | Freq: Once | INTRAVENOUS | Status: AC
  Administered 2013-02-18: 1000 mL via INTRAVENOUS

## 2013-02-18 MED ORDER — METOCLOPRAMIDE HCL 5 MG/ML IJ SOLN
10.0000 mg | Freq: Once | INTRAMUSCULAR | Status: AC
  Administered 2013-02-18: 10 mg via INTRAVENOUS
  Filled 2013-02-18: qty 2

## 2013-02-18 MED ORDER — KETOROLAC TROMETHAMINE 30 MG/ML IJ SOLN
30.0000 mg | Freq: Once | INTRAMUSCULAR | Status: AC
  Administered 2013-02-18: 30 mg via INTRAVENOUS
  Filled 2013-02-18: qty 1

## 2013-02-18 MED ORDER — DIPHENHYDRAMINE HCL 50 MG/ML IJ SOLN
25.0000 mg | Freq: Once | INTRAMUSCULAR | Status: AC
  Administered 2013-02-18: 25 mg via INTRAVENOUS
  Filled 2013-02-18: qty 1

## 2013-02-18 NOTE — ED Provider Notes (Signed)
CSN: [id number]     Arrival date & time 02/18/13  0216 History   First MD Initiated Contact with Patient 02/18/13 [phone]     Chief Complaint  Patient presents with  . Headache   (Consider location/radiation/quality/duration/timing/severity/associated sxs/prior Treatment) HPI Comments: Patient with history of bipolar disorder and previous stroke. She presents tonight with complaints of headache that started around midnight. She states that her vision is blurry and that she feels "weak all over". Shortly after the headache started she began to feel shaky and as if she is going to have a panic attack. She feels somewhat short of breath and tight in her chest as well.  Patient is a 47 y.o. female presenting with headaches. The history is provided by the patient.  Headache Pain location:  Generalized Quality:  Dull Radiates to:  Does not radiate Onset quality:  Sudden Duration:  2 hours Timing:  Constant Progression:  Worsening Chronicity:  New Similar to prior headaches: yes   Context: activity and bright light   Relieved by:  Nothing Worsened by:  Nothing tried Ineffective treatments:  None tried Associated symptoms: fatigue, myalgias and nausea   Associated symptoms: no diarrhea, no fever and no vomiting     Past Medical History  Diagnosis Date  . Hypertension   . Bipolar 1 disorder   . Schizo affective schizophrenia   . Depression, major   . Hyperlipidemia 05/06/2012  . Tobacco abuse 05/06/2012  . Depression 05/06/2012  . Stroke     05/02/12  . Obesity    Past Surgical History  Procedure Laterality Date  . Cholecystectomy    . Abdominal hysterectomy    . Tee without cardioversion  05/06/2012    Procedure: TRANSESOPHAGEAL ECHOCARDIOGRAM (TEE);  Surgeon: Laurey Morale, MD;  Location: [facility] ENDOSCOPY;  Service: Cardiovascular;  Laterality: N/A;   Family History  Problem Relation Age of Onset  . Multiple sclerosis Sister   . COPD Sister   . COPD Sister   . Anxiety disorder  Brother   . COPD Mother   . COPD Father    History  Substance Use Topics  . Smoking status: Current Every Day Smoker -- 0.50 packs/day    Types: Cigarettes  . Smokeless tobacco: Never Used  . Alcohol Use: No     Comment: ocassional   OB History   Grav Para Term Preterm Abortions TAB SAB Ect Mult Living                 Review of Systems  Constitutional: Positive for fatigue. Negative for fever, chills, activity change and appetite change.  Eyes:       Blurry vision  Respiratory: Positive for shortness of breath.   Cardiovascular: Positive for chest pain.  Gastrointestinal: Positive for nausea. Negative for vomiting and diarrhea.  Genitourinary: Negative for dysuria.  Musculoskeletal: Positive for myalgias.  Skin: Negative for rash.  Neurological: Positive for headaches.  Psychiatric/Behavioral: The patient is nervous/anxious.   All other systems reviewed and are negative.    Allergies  Morphine and related  Home Medications   Current Outpatient Rx  Name  Route  Sig  Dispense  Refill  . clonazePAM (KLONOPIN) 1 MG tablet   Oral   Take 1 mg by mouth 2 (two) times daily as needed for anxiety.         . methocarbamol (ROBAXIN) 500 MG tablet   Oral   Take 500 mg by mouth 2 (two) times daily as needed.         Marland Kitchen  aspirin 81 MG chewable tablet   Oral   Chew 1 tablet (81 mg total) by mouth daily.         Marland Kitchen atorvastatin (LIPITOR) 20 MG tablet   Oral   Take 1 tablet (20 mg total) by mouth daily at 6 PM.   30 tablet   2   . citalopram (CELEXA) 20 MG tablet   Oral   Take 30 mg by mouth daily.         . divalproex (DEPAKOTE) 500 MG DR tablet   Oral   Take 500 mg by mouth 2 (two) times daily.         Marland Kitchen gabapentin (NEURONTIN) 300 MG capsule   Oral   Take 300 mg by mouth 3 (three) times daily.         . metoprolol succinate (TOPROL-XL) 50 MG 24 hr tablet   Oral   Take 50 mg by mouth daily. Take with or immediately following a meal.         .  OLANZapine (ZYPREXA) 5 MG tablet   Oral   Take 1 tablet (5 mg total) by mouth daily.   30 tablet   2   . paliperidone (INVEGA) 6 MG 24 hr tablet   Oral   Take 6 mg by mouth daily.         . QUEtiapine (SEROQUEL XR) 150 MG 24 hr tablet   Oral   Take 1 tablet (150 mg total) by mouth daily at 8 pm.   30 each   2   . UNABLE TO FIND      Ms. Jamieka Kovatch was admitted and treated for her illness at [facility] between 05/04/12 and 05/14/12.   1 Units   0    BP 171/92  Pulse 104  Temp(Src) 98.3 F (36.8 C) (Oral)  Resp 18  Ht 5\' 3"  (1.6 m)  Wt 208 lb (94.348 kg)  BMI 36.85 kg/m2  SpO2 98% Physical Exam  Nursing note and vitals reviewed. Constitutional: She is oriented to person, place, and time. She appears well-developed and well-nourished. No distress.  HENT:  Head: Normocephalic and atraumatic.  Eyes: EOM are normal. Pupils are equal, round, and reactive to light.  No papilledema on funduscopic exam  Neck: Normal range of motion. Neck supple.  Cardiovascular: Normal rate and regular rhythm.  Exam reveals no gallop and no friction rub.   No murmur heard. Pulmonary/Chest: Effort normal and breath sounds normal. No respiratory distress. She has no wheezes.  Abdominal: Soft. Bowel sounds are normal. She exhibits no distension. There is no tenderness.  Musculoskeletal: Normal range of motion.  Neurological: She is alert and oriented to person, place, and time. No cranial nerve deficit. She exhibits normal muscle tone. Coordination normal.  Skin: Skin is warm and dry. She is not diaphoretic.    ED Course  Procedures (including critical care time) Labs Review Labs Reviewed  CBC WITH DIFFERENTIAL  COMPREHENSIVE METABOLIC PANEL  TROPONIN I   Imaging Review No results found.   Date: 02/18/2013  Rate: 86  Rhythm: normal sinus rhythm  QRS Axis: normal  Intervals: normal  ST/T Wave abnormalities: normal  Conduction Disutrbances:none  Narrative  Interpretation:   Old EKG Reviewed: unchanged     MDM  No diagnosis found. Patient presents here with multiple complaints including headache, anxiety, tightness in the chest, weakness. Workup reveals an area of prior stroke on the CT scan, normal EKG, had unremarkable laboratory studies. She is  feeling better with migraine cocktail I feel as though she is stable for discharge. I see no indication for an LP or further intervention. She is to followup with her primary doctor if she is not improving and return to the ER if her symptoms worsen or she develops new or concerning symptoms.    Geoffery Lyons, MD 02/18/13 [phone]

## 2013-02-18 NOTE — ED Notes (Signed)
Pt c/o generalized weakness and fatigue x 2 days with headache starting last night. Pt reports HA worsened thru night. Pt states she is now anxious and scared due to previous stroke. "I'm having a panic attack." pt c/o chest tightness.

## 2013-02-18 NOTE — ED Notes (Signed)
Pt sts she had a sudden HA at apprx. Midnight with N/V.

## 2013-02-18 NOTE — ED Notes (Signed)
Lights dimmed per pt request

## 2013-05-11 ENCOUNTER — Emergency Department (HOSPITAL_BASED_OUTPATIENT_CLINIC_OR_DEPARTMENT_OTHER)
Admission: EM | Admit: 2013-05-11 | Discharge: 2013-05-11 | Disposition: A | Discharge status: Home / Self Care | Payer: Medicaid Other | Attending: Emergency Medicine | Admitting: Emergency Medicine

## 2013-05-11 ENCOUNTER — Encounter (HOSPITAL_BASED_OUTPATIENT_CLINIC_OR_DEPARTMENT_OTHER): Payer: Self-pay | Admitting: Emergency Medicine

## 2013-05-11 DIAGNOSIS — Z7982 Long term (current) use of aspirin: Secondary | ICD-10-CM | POA: Insufficient documentation

## 2013-05-11 DIAGNOSIS — F17200 Nicotine dependence, unspecified, uncomplicated: Secondary | ICD-10-CM | POA: Insufficient documentation

## 2013-05-11 DIAGNOSIS — E785 Hyperlipidemia, unspecified: Secondary | ICD-10-CM | POA: Insufficient documentation

## 2013-05-11 DIAGNOSIS — R252 Cramp and spasm: Secondary | ICD-10-CM

## 2013-05-11 DIAGNOSIS — Z79899 Other long term (current) drug therapy: Secondary | ICD-10-CM | POA: Insufficient documentation

## 2013-05-11 DIAGNOSIS — N289 Disorder of kidney and ureter, unspecified: Secondary | ICD-10-CM | POA: Insufficient documentation

## 2013-05-11 DIAGNOSIS — Z8673 Personal history of transient ischemic attack (TIA), and cerebral infarction without residual deficits: Secondary | ICD-10-CM | POA: Insufficient documentation

## 2013-05-11 DIAGNOSIS — I10 Essential (primary) hypertension: Secondary | ICD-10-CM | POA: Insufficient documentation

## 2013-05-11 DIAGNOSIS — IMO0001 Reserved for inherently not codable concepts without codable children: Secondary | ICD-10-CM | POA: Insufficient documentation

## 2013-05-11 DIAGNOSIS — F329 Major depressive disorder, single episode, unspecified: Secondary | ICD-10-CM | POA: Insufficient documentation

## 2013-05-11 DIAGNOSIS — E669 Obesity, unspecified: Secondary | ICD-10-CM | POA: Insufficient documentation

## 2013-05-11 DIAGNOSIS — F259 Schizoaffective disorder, unspecified: Secondary | ICD-10-CM | POA: Insufficient documentation

## 2013-05-11 LAB — URINALYSIS, ROUTINE W REFLEX MICROSCOPIC
Bilirubin Urine: NEGATIVE
Glucose, UA: NEGATIVE mg/dL
Protein, ur: NEGATIVE mg/dL

## 2013-05-11 LAB — URINE MICROSCOPIC-ADD ON

## 2013-05-11 LAB — BASIC METABOLIC PANEL
BUN: 9 mg/dL (ref 6–23)
CO2: 24 mEq/L (ref 19–32)
Calcium: 9.7 mg/dL (ref 8.4–10.5)
GFR calc non Af Amer: 53 mL/min — ABNORMAL LOW (ref >90)
Glucose, Bld: 107 mg/dL — ABNORMAL HIGH (ref 70–99)
Sodium: 136 mEq/L (ref 135–145)

## 2013-05-11 LAB — CBC
HCT: 40.9 % (ref 36.0–46.0)
Hemoglobin: 13.7 g/dL (ref 12.0–15.0)
MCH: 29.5 pg (ref 26.0–34.0)
MCHC: 33.5 g/dL (ref 30.0–36.0)
MCV: 88 fL (ref 78.0–100.0)
RBC: 4.65 MIL/uL (ref 3.87–5.11)

## 2013-05-11 MED ORDER — OXYCODONE-ACETAMINOPHEN 5-325 MG PO TABS: 1.0000 | ORAL_TABLET | ORAL | Status: DC | PRN

## 2013-05-11 MED ORDER — OXYCODONE-ACETAMINOPHEN 5-325 MG PO TABS
2.0000 | ORAL_TABLET | Freq: Once | ORAL | Status: AC
  Administered 2013-05-11: 2 via ORAL
  Filled 2013-05-11: qty 2

## 2013-05-11 MED ORDER — SODIUM CHLORIDE 0.9 % IV BOLUS (SEPSIS)
1000.0000 mL | Freq: Once | INTRAVENOUS | Status: AC
  Administered 2013-05-11: 1000 mL via INTRAVENOUS

## 2013-05-11 MED ORDER — KETOROLAC TROMETHAMINE 30 MG/ML IJ SOLN
30.0000 mg | Freq: Once | INTRAMUSCULAR | Status: AC
  Administered 2013-05-11: 30 mg via INTRAVENOUS
  Filled 2013-05-11: qty 1

## 2013-05-11 NOTE — ED Provider Notes (Signed)
Medical screening examination/treatment/procedure(s) were performed by non-physician practitioner and as supervising physician I was immediately available for consultation/collaboration.     Carnella Fryman, MD 05/11/13 2306 

## 2013-05-11 NOTE — ED Notes (Signed)
Pt reports greater than 1 month hx of generalized body cramps.  Reports that she has been to her pcp and given robaxin without relief.

## 2013-05-11 NOTE — ED Provider Notes (Signed)
CSN: [id number]     Arrival date & time 05/11/13  2005 History   First MD Initiated Contact with Patient 05/11/13 2014     Chief Complaint  Patient presents with  . Generalized Body Aches   (Consider location/radiation/quality/duration/timing/severity/associated sxs/prior Treatment) HPI Comments: Pt states that she has been having intermittent body cramps for the last month:pt states that they have been in extremities, in her abdomen and in her back:pt was seen by pcp and was given robaxin and had minimal relief but the   The history is provided by the patient. No language interpreter was used.    Past Medical History  Diagnosis Date  . Hypertension   . Bipolar 1 disorder   . Schizo affective schizophrenia   . Depression, major   . Hyperlipidemia 05/06/2012  . Tobacco abuse 05/06/2012  . Depression 05/06/2012  . Stroke     05/02/12  . Obesity    Past Surgical History  Procedure Laterality Date  . Cholecystectomy    . Abdominal hysterectomy    . Tee without cardioversion  05/06/2012    Procedure: TRANSESOPHAGEAL ECHOCARDIOGRAM (TEE);  Surgeon: Laurey Morale, MD;  Location: [facility] ENDOSCOPY;  Service: Cardiovascular;  Laterality: N/A;   Family History  Problem Relation Age of Onset  . Multiple sclerosis Sister   . COPD Sister   . COPD Sister   . Anxiety disorder Brother   . COPD Mother   . COPD Father    History  Substance Use Topics  . Smoking status: Current Every Day Smoker -- 0.50 packs/day    Types: Cigarettes  . Smokeless tobacco: Never Used  . Alcohol Use: No     Comment: ocassional   OB History   Grav Para Term Preterm Abortions TAB SAB Ect Mult Living                 Review of Systems  Allergies  Morphine and related and Hydrocodone  Home Medications   Current Outpatient Rx  Name  Route  Sig  Dispense  Refill  . aspirin 81 MG chewable tablet   Oral   Chew 1 tablet (81 mg total) by mouth daily.         Marland Kitchen atorvastatin (LIPITOR) 20 MG tablet    Oral   Take 1 tablet (20 mg total) by mouth daily at 6 PM.   30 tablet   2   . citalopram (CELEXA) 20 MG tablet   Oral   Take 30 mg by mouth daily.         . clonazePAM (KLONOPIN) 1 MG tablet   Oral   Take 1 mg by mouth 2 (two) times daily as needed for anxiety.         . divalproex (DEPAKOTE) 500 MG DR tablet   Oral   Take 500 mg by mouth 2 (two) times daily.         Marland Kitchen gabapentin (NEURONTIN) 300 MG capsule   Oral   Take 300 mg by mouth 3 (three) times daily.         . methocarbamol (ROBAXIN) 500 MG tablet   Oral   Take 500 mg by mouth 2 (two) times daily as needed.         . metoprolol succinate (TOPROL-XL) 50 MG 24 hr tablet   Oral   Take 50 mg by mouth daily. Take with or immediately following a meal.         . OLANZapine (ZYPREXA) 5 MG tablet  Oral   Take 1 tablet (5 mg total) by mouth daily.   30 tablet   2   . paliperidone (INVEGA) 6 MG 24 hr tablet   Oral   Take 6 mg by mouth daily.         . QUEtiapine (SEROQUEL XR) 150 MG 24 hr tablet   Oral   Take 1 tablet (150 mg total) by mouth daily at 8 pm.   30 each   2   . UNABLE TO FIND      April [name] was admitted and treated for her illness at [facility] between 05/04/12 and 05/14/12.   1 Units   0    BP 166/91  Pulse 120  Temp(Src) 98.3 F (36.8 C) (Oral)  Resp 18  Ht 5\' 3"  (1.6 m)  Wt 217 lb (98.431 kg)  BMI 38.45 kg/m2  SpO2 98% Physical Exam  Constitutional: She is oriented to person, place, and time. She appears well-developed and well-nourished.  HENT:  Head: Normocephalic and atraumatic.  Cardiovascular: Normal rate and regular rhythm.   Pulmonary/Chest: Effort normal and breath sounds normal.  Musculoskeletal: Normal range of motion.  Neurological: She is alert and oriented to person, place, and time. Coordination normal.  Skin: Skin is warm and dry.  Psychiatric: She has a normal mood and affect.    ED Course  Procedures (including critical care  time) Labs Review Labs Reviewed  CBC - Abnormal; Notable for the following:    WBC 11.2 (*)    All other components within normal limits  BASIC METABOLIC PANEL - Abnormal; Notable for the following:    Glucose, Bld 107 (*)    Creatinine, Ser 1.20 (*)    GFR calc non Af Amer 53 (*)    GFR calc Af Amer 61 (*)    All other components within normal limits  URINALYSIS, ROUTINE W REFLEX MICROSCOPIC - Abnormal; Notable for the following:    APPearance CLOUDY (*)    Hgb urine dipstick MODERATE (*)    Ketones, ur 15 (*)    All other components within normal limits  URINE MICROSCOPIC-ADD ON - Abnormal; Notable for the following:    Bacteria, UA FEW (*)    All other components within normal limits   Imaging Review No results found.  EKG Interpretation   None       MDM   1. Renal insufficiency   2. Muscle cramping     9:34 PM On discussion of medications pt states that she abruptly stopped her depakote just prior to these symptoms starting:potentially a side effect    April Lower, NP 05/11/13 2153

## 2013-05-16 ENCOUNTER — Emergency Department (HOSPITAL_BASED_OUTPATIENT_CLINIC_OR_DEPARTMENT_OTHER): Payer: No Typology Code available for payment source

## 2013-05-16 ENCOUNTER — Emergency Department (HOSPITAL_BASED_OUTPATIENT_CLINIC_OR_DEPARTMENT_OTHER)
Admission: EM | Admit: 2013-05-16 | Discharge: 2013-05-16 | Disposition: A | Discharge status: Home / Self Care | Payer: No Typology Code available for payment source | Attending: Emergency Medicine | Admitting: Emergency Medicine

## 2013-05-16 ENCOUNTER — Encounter (HOSPITAL_BASED_OUTPATIENT_CLINIC_OR_DEPARTMENT_OTHER): Payer: Self-pay | Admitting: Emergency Medicine

## 2013-05-16 DIAGNOSIS — S40029A Contusion of unspecified upper arm, initial encounter: Secondary | ICD-10-CM | POA: Diagnosis not present

## 2013-05-16 DIAGNOSIS — F259 Schizoaffective disorder, unspecified: Secondary | ICD-10-CM | POA: Diagnosis not present

## 2013-05-16 DIAGNOSIS — E669 Obesity, unspecified: Secondary | ICD-10-CM | POA: Diagnosis not present

## 2013-05-16 DIAGNOSIS — F17200 Nicotine dependence, unspecified, uncomplicated: Secondary | ICD-10-CM | POA: Insufficient documentation

## 2013-05-16 DIAGNOSIS — S5010XA Contusion of unspecified forearm, initial encounter: Secondary | ICD-10-CM | POA: Insufficient documentation

## 2013-05-16 DIAGNOSIS — Z79899 Other long term (current) drug therapy: Secondary | ICD-10-CM | POA: Insufficient documentation

## 2013-05-16 DIAGNOSIS — IMO0002 Reserved for concepts with insufficient information to code with codable children: Secondary | ICD-10-CM | POA: Insufficient documentation

## 2013-05-16 DIAGNOSIS — S298XXA Other specified injuries of thorax, initial encounter: Secondary | ICD-10-CM | POA: Insufficient documentation

## 2013-05-16 DIAGNOSIS — I10 Essential (primary) hypertension: Secondary | ICD-10-CM | POA: Diagnosis not present

## 2013-05-16 DIAGNOSIS — E785 Hyperlipidemia, unspecified: Secondary | ICD-10-CM | POA: Insufficient documentation

## 2013-05-16 DIAGNOSIS — Z7982 Long term (current) use of aspirin: Secondary | ICD-10-CM | POA: Insufficient documentation

## 2013-05-16 DIAGNOSIS — Z23 Encounter for immunization: Secondary | ICD-10-CM | POA: Diagnosis not present

## 2013-05-16 DIAGNOSIS — Z8673 Personal history of transient ischemic attack (TIA), and cerebral infarction without residual deficits: Secondary | ICD-10-CM | POA: Insufficient documentation

## 2013-05-16 DIAGNOSIS — Y9389 Activity, other specified: Secondary | ICD-10-CM | POA: Insufficient documentation

## 2013-05-16 DIAGNOSIS — Y92410 Unspecified street and highway as the place of occurrence of the external cause: Secondary | ICD-10-CM | POA: Insufficient documentation

## 2013-05-16 DIAGNOSIS — F3130 Bipolar disorder, current episode depressed, mild or moderate severity, unspecified: Secondary | ICD-10-CM | POA: Diagnosis not present

## 2013-05-16 DIAGNOSIS — S59909A Unspecified injury of unspecified elbow, initial encounter: Secondary | ICD-10-CM | POA: Diagnosis present

## 2013-05-16 DIAGNOSIS — T07XXXA Unspecified multiple injuries, initial encounter: Secondary | ICD-10-CM

## 2013-05-16 MED ORDER — TRAMADOL HCL 50 MG PO TABS
50.0000 mg | ORAL_TABLET | Freq: Once | ORAL | Status: AC
  Administered 2013-05-16: 50 mg via ORAL
  Filled 2013-05-16: qty 1

## 2013-05-16 MED ORDER — TRAMADOL HCL 50 MG PO TABS
50.0000 mg | ORAL_TABLET | Freq: Four times a day (QID) | ORAL | Status: DC | PRN
Start: 2013-05-16 — End: 2014-03-09

## 2013-05-16 MED ORDER — TETANUS-DIPHTH-ACELL PERTUSSIS 5-2.5-18.5 LF-MCG/0.5 IM SUSP
0.5000 mL | Freq: Once | INTRAMUSCULAR | Status: AC
  Administered 2013-05-16: 0.5 mL via INTRAMUSCULAR
  Filled 2013-05-16: qty 0.5
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
## 2013-05-16 NOTE — ED Provider Notes (Signed)
Medical screening examination/treatment/procedure(s) were performed by non-physician practitioner and as supervising physician I was immediately available for consultation/collaboration.  EKG Interpretation   None         Charles B. Bernette Mayers, MD 05/16/13 2257
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
## 2013-10-29 ENCOUNTER — Other Ambulatory Visit (HOSPITAL_COMMUNITY): Payer: Self-pay | Admitting: Internal Medicine

## 2013-10-29 DIAGNOSIS — Z1231 Encounter for screening mammogram for malignant neoplasm of breast: Secondary | ICD-10-CM

## 2013-11-17 ENCOUNTER — Ambulatory Visit (HOSPITAL_COMMUNITY)
Admission: RE | Admit: 2013-11-17 | Discharge: 2013-11-17 | Disposition: A | Discharge status: Home / Self Care | Payer: Medicaid Other | Source: Ambulatory Visit | Attending: Internal Medicine | Admitting: Internal Medicine

## 2013-11-17 DIAGNOSIS — Z1231 Encounter for screening mammogram for malignant neoplasm of breast: Secondary | ICD-10-CM | POA: Diagnosis present

## 2013-12-10 IMAGING — CT CT HEAD W/O CM
1 series · 16 of 30 positions shown, 20 images · non-contrast
Comparison: Prior MRI from 05/04/2012

CLINICAL DATA: Severe headache

CT HEAD WITHOUT CONTRAST
TECHNIQUE: Contiguous axial images were obtained from the base of
the skull through the vertex without contrast.

[Series 2: head 4.8 h37s · axial · 0.45mm/px · z∈[-189,-52]mm · 16 of 32 slices shown, 20 images]
[im 2/32  brain]
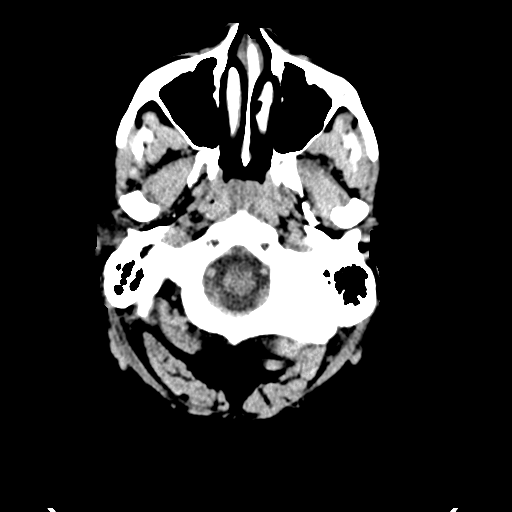
[im 2/32  bone]
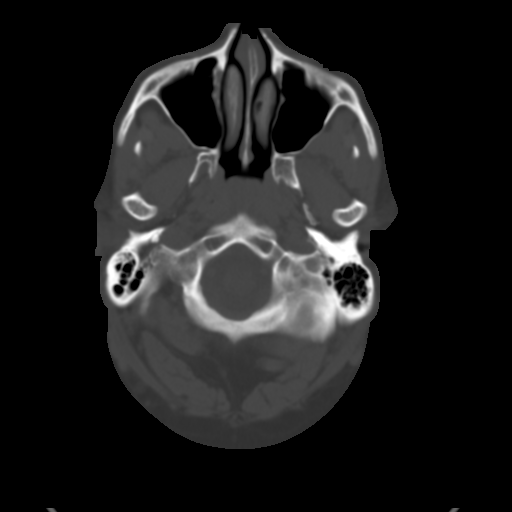
[im 4/32  brain]
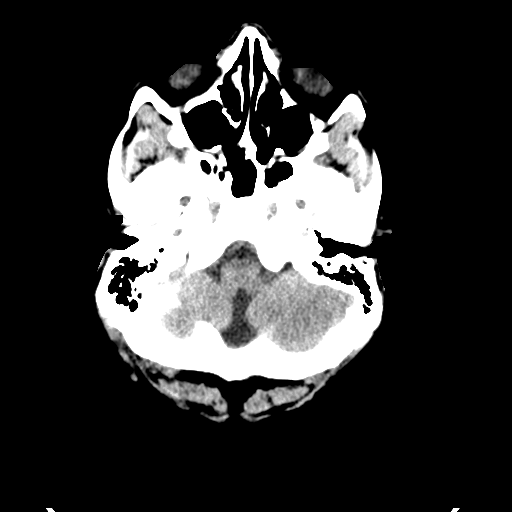
[im 6/32  brain]
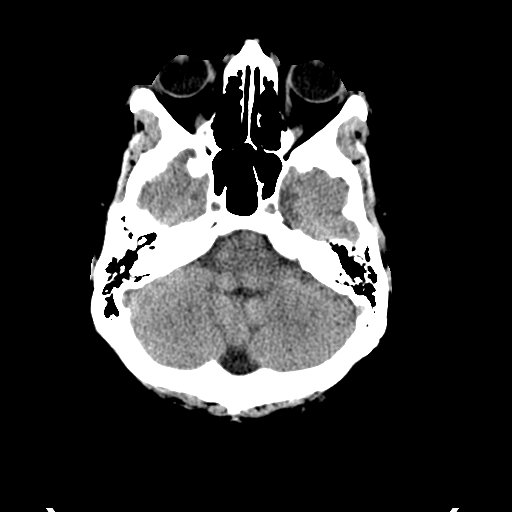
[im 8/32  brain]
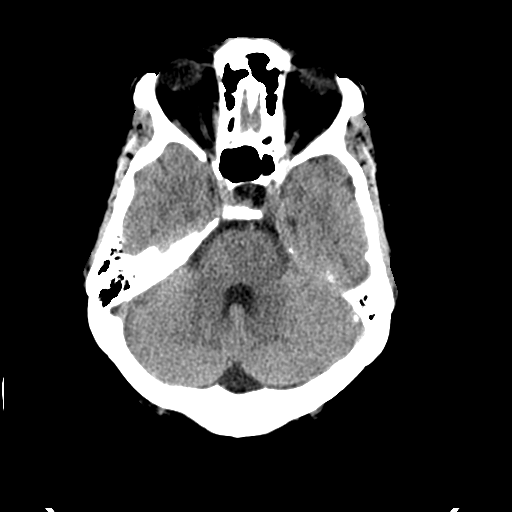
[im 9/32  brain]
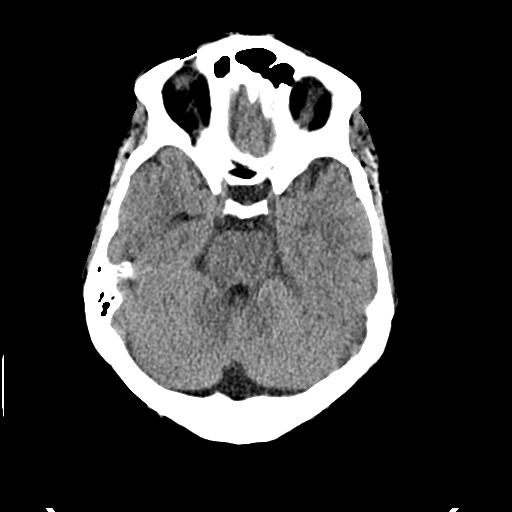
[im 9/32  bone]
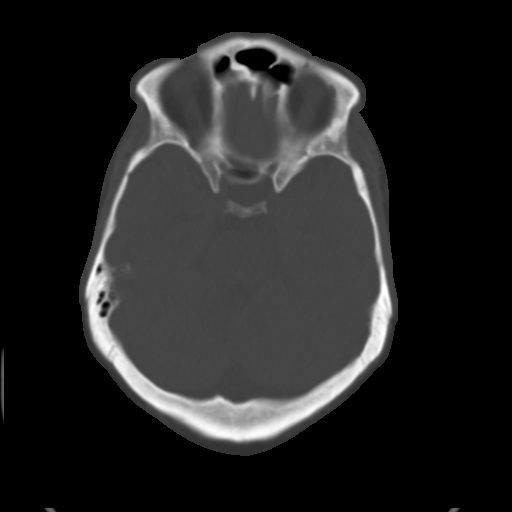
[im 11/32  brain]
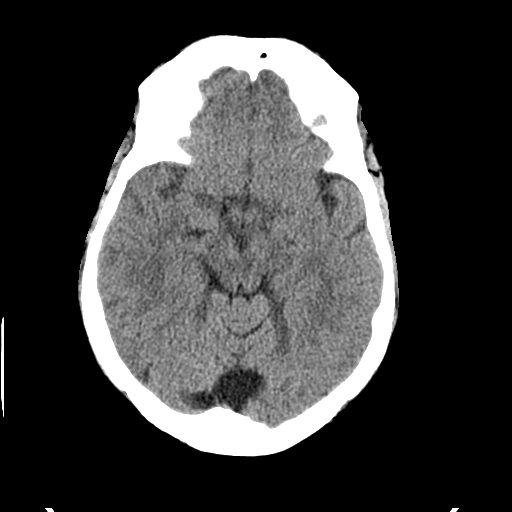
[im 13/32  brain]
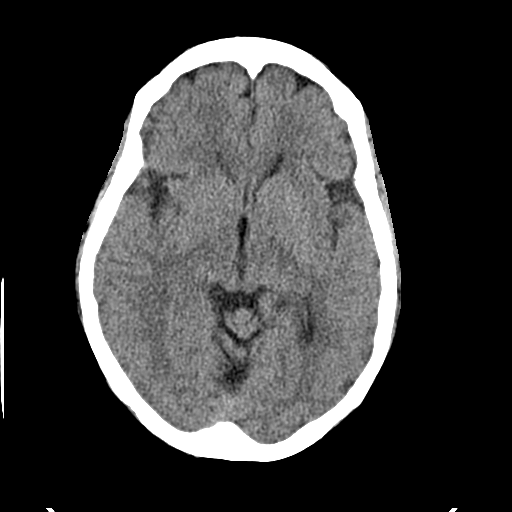
[im 15/32  brain]
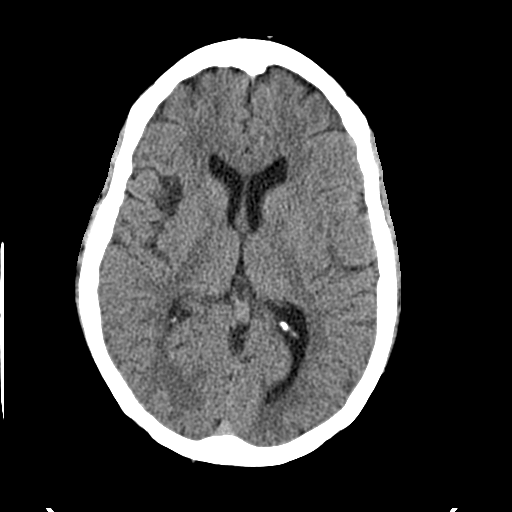
[im 17/32  brain]
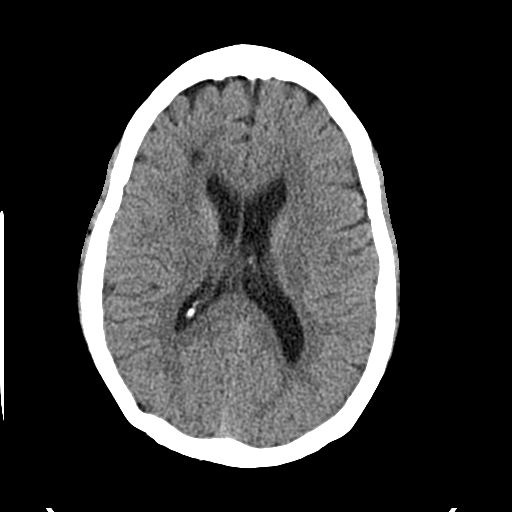
[im 17/32  bone]
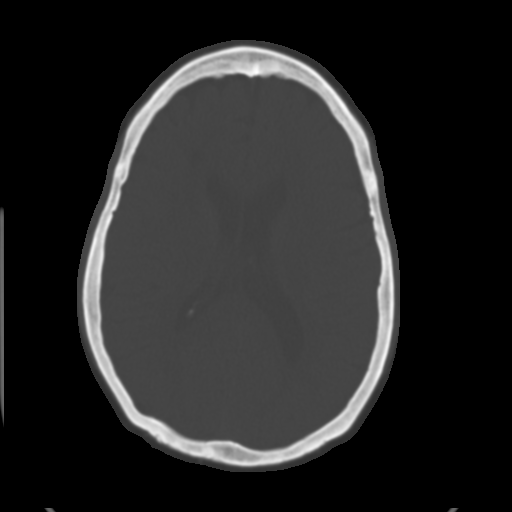
[im 19/32  brain]
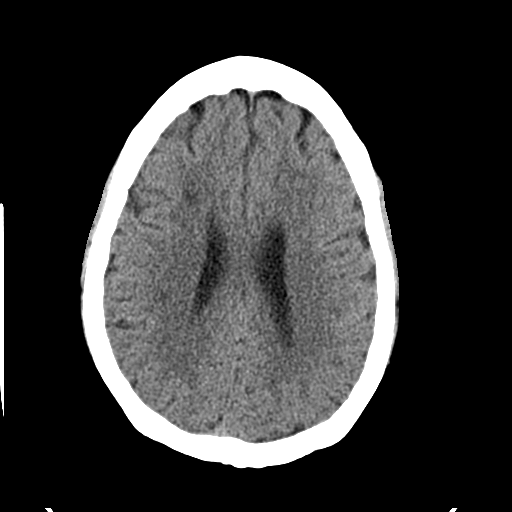
[im 21/32  brain]
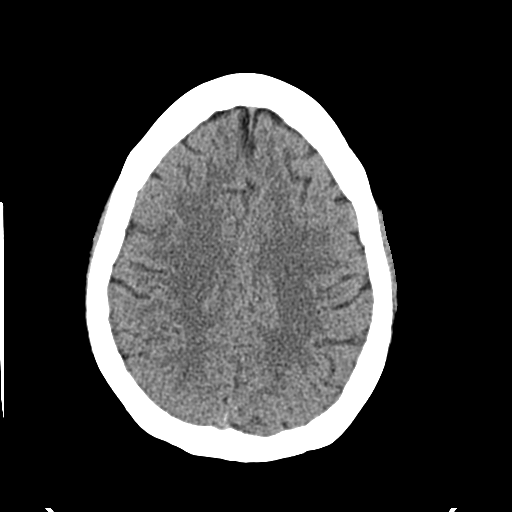
[im 23/32  brain]
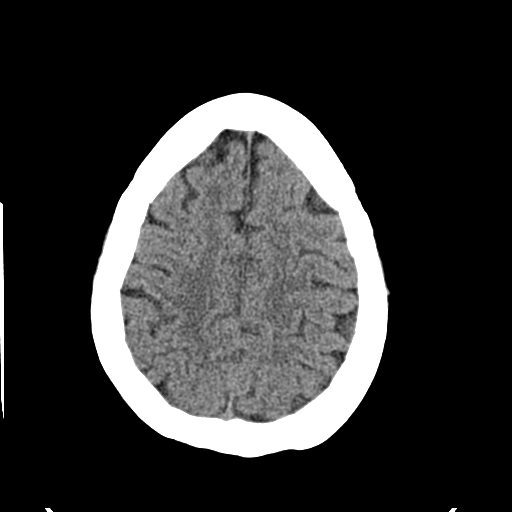
[im 24/32  brain]
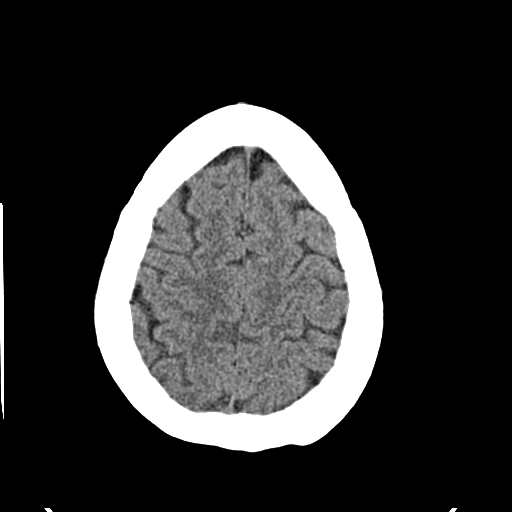
[im 24/32  bone]
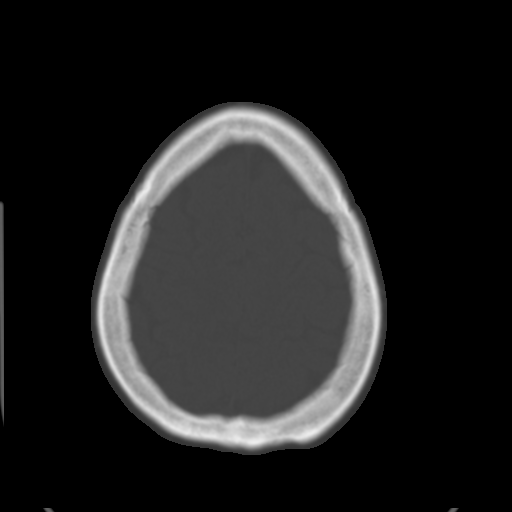
[im 26/32  brain]
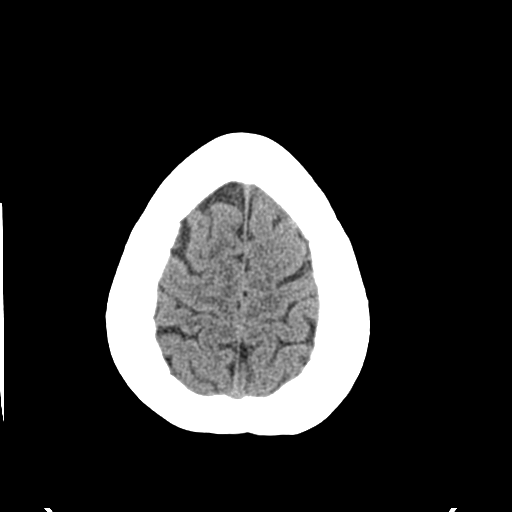
[im 28/32  brain]
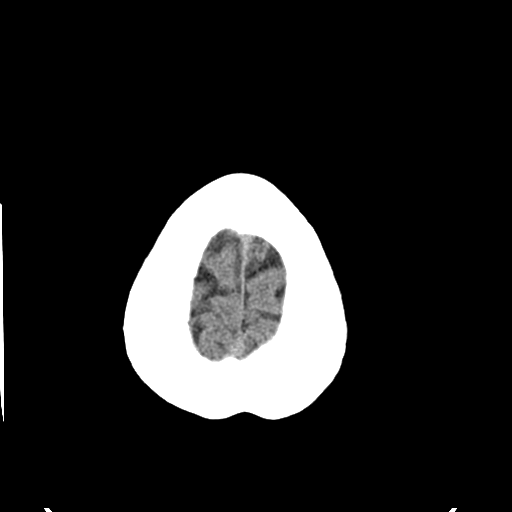
[im 30/32  brain]
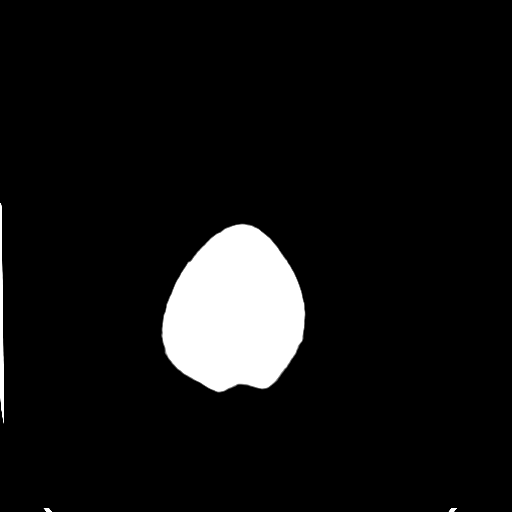

[16 of 30 positions shown; findings below may reference images not displayed]

FINDINGS: Focal hypodensities within the right periventricular and
deep white matter of the right frontal lobe are compatible with
previously identified right MCA infarct.  No acute intracranial
hemorrhage or infarct identified.  No mass or midline shift.  CSF
containing spaces are normal.  Gray-white matter differentiation is
maintained.  No extra-axial fluid collection.  The calvarium is
intact.  Orbits are normal.

Paranasal sinuses and mastoid air cells well pneumatized and free
fluid.
IMPRESSION: Sequelae of remote right MCA territory infarcts.  No acute
intracranial process identified.

## 2014-03-09 ENCOUNTER — Ambulatory Visit (INDEPENDENT_AMBULATORY_CARE_PROVIDER_SITE_OTHER): Payer: Medicaid Other | Admitting: Obstetrics

## 2014-03-09 ENCOUNTER — Encounter: Payer: Self-pay | Admitting: Obstetrics

## 2014-03-09 VITALS — Temp 98.1°F | Ht 63.0 in | Wt 217.0 lb

## 2014-03-09 DIAGNOSIS — L0292 Furuncle, unspecified: Secondary | ICD-10-CM

## 2014-03-09 DIAGNOSIS — B9689 Other specified bacterial agents as the cause of diseases classified elsewhere: Secondary | ICD-10-CM

## 2014-03-09 DIAGNOSIS — Z0000 Encounter for general adult medical examination without abnormal findings: Secondary | ICD-10-CM

## 2014-03-09 DIAGNOSIS — N760 Acute vaginitis: Secondary | ICD-10-CM

## 2014-03-09 MED ORDER — CLINDAMYCIN HCL 300 MG PO CAPS: 300.0000 mg | ORAL_CAPSULE | Freq: Three times a day (TID) | ORAL | Status: DC

## 2014-03-09 NOTE — Addendum Note (Signed)
Addended by: Ladona Ridgel on: 03/09/2014 04:42 PM   Modules accepted: Orders

## 2014-03-09 NOTE — Addendum Note (Signed)
Addended by: Ladona Ridgel on: 03/09/2014 04:39 PM   Modules accepted: Orders

## 2014-03-09 NOTE — Progress Notes (Signed)
Subjective:     April [name] is a 48 y.o. female here for a routine exam.  Current complaints: vaginal discharge and boils in genital area.  She is S/P Hysterectomy.  Personal health questionnaire:  Is patient Ashkenazi Jewish, have a family history of breast and/or ovarian cancer: no Is there a family history of uterine cancer diagnosed at age < 19, gastrointestinal cancer, urinary tract cancer, family member who is a Field seismologist syndrome-associated carrier: no Is the patient overweight and hypertensive, family history of diabetes, personal history of gestational diabetes or PCOS: no Is patient over 58, have PCOS,  family history of premature CHD under age 19, diabetes, smoke, have hypertension or peripheral artery disease:  no At any time, has a partner hit, kicked or otherwise hurt or frightened you?: no Over the past 2 weeks, have you felt down, depressed or hopeless?: no Over the past 2 weeks, have you felt little interest or pleasure in doing things?:no   Gynecologic History No LMP recorded. Patient has had a hysterectomy. Contraception: status post hysterectomy Last Pap: ~ 8 years ago, before hysterectomy for fibroids. Results were: normal Last mammogram: 2015. Results were: normal  Obstetric History OB History  Gravida Para Term Preterm AB SAB TAB Ectopic Multiple Living  3 3 3       3     # Outcome Date GA Lbr Len/2nd Weight Sex Delivery Anes PTL Lv  3 TRM     F SVD   Y  2 TRM     M SVD   Y  1 TRM     M SVD   Y      Past Medical History  Diagnosis Date  . Hypertension   . Bipolar 1 disorder   . Schizo affective schizophrenia   . Depression, major   . Hyperlipidemia 05/06/2012  . Tobacco abuse 05/06/2012  . Depression 05/06/2012  . Stroke     05/02/12  . Obesity     Past Surgical History  Procedure Laterality Date  . Cholecystectomy    . Abdominal hysterectomy    . Tee without cardioversion  05/06/2012    Procedure: TRANSESOPHAGEAL ECHOCARDIOGRAM (TEE);  Surgeon: Larey Dresser, MD;  Location: [facility] ENDOSCOPY;  Service: Cardiovascular;  Laterality: N/A;    Current outpatient prescriptions:ARIPiprazole (ABILIFY) 20 MG tablet, Take 20 mg by mouth daily., Disp: , Rfl: ;  aspirin 81 MG chewable tablet, Chew 1 tablet (81 mg total) by mouth daily., Disp: , Rfl: ;  atorvastatin (LIPITOR) 20 MG tablet, Take 1 tablet (20 mg total) by mouth daily at 6 PM., Disp: 30 tablet, Rfl: 2;  citalopram (CELEXA) 20 MG tablet, Take 30 mg by mouth daily., Disp: , Rfl:  cyclobenzaprine (FLEXERIL) 10 MG tablet, Take 10 mg by mouth 3 (three) times daily as needed for muscle spasms., Disp: , Rfl: ;  gabapentin (NEURONTIN) 300 MG capsule, Take 300 mg by mouth 3 (three) times daily., Disp: , Rfl: ;  LORazepam (ATIVAN) 1 MG tablet, Take 1 mg by mouth every 8 (eight) hours., Disp: , Rfl: ;  methocarbamol (ROBAXIN) 500 MG tablet, Take 500 mg by mouth 2 (two) times daily as needed., Disp: , Rfl:  metoprolol succinate (TOPROL-XL) 50 MG 24 hr tablet, Take 50 mg by mouth daily. Take with or immediately following a meal., Disp: , Rfl: ;  QUEtiapine (SEROQUEL XR) 150 MG 24 hr tablet, Take 1 tablet (150 mg total) by mouth daily at 8 pm., Disp: 30 each, Rfl: 2;  clindamycin (CLEOCIN)  300 MG capsule, Take 1 capsule (300 mg total) by mouth 3 (three) times daily., Disp: 30 capsule, Rfl: 5 Allergies  Allergen Reactions  . Morphine And Related Hives and Itching  . Hydrocodone Rash    History  Substance Use Topics  . Smoking status: Current Every Day Smoker -- 0.50 packs/day    Types: Cigarettes  . Smokeless tobacco: Never Used  . Alcohol Use: No     Comment: ocassional    Family History  Problem Relation Age of Onset  . Multiple sclerosis Sister   . COPD Sister   . COPD Sister   . Anxiety disorder Brother   . COPD Mother   . COPD Father       Review of Systems  Constitutional: negative for fatigue and weight loss Respiratory: negative for cough and wheezing Cardiovascular: negative for chest  pain, fatigue and palpitations Gastrointestinal: negative for abdominal pain and change in bowel habits Musculoskeletal:negative for myalgias Neurological: negative for gait problems and tremors Behavioral/Psych: negative for abusive relationship, depression Endocrine: negative for temperature intolerance   Genitourinary: positive for vaginal discharge with odor and genital hair bumps that get infected Integument/breast: negative for breast lump, breast tenderness, nipple discharge and skin lesion(s)    Objective:       Temp(Src) 98.1 F (36.7 C)  Ht 5\' 3"  (1.6 m)  Wt 217 lb (98.431 kg)  BMI 38.45 kg/m2 General:   alert  Skin:   no rash or abnormalities  Lungs:   clear to auscultation bilaterally  Heart:   regular rate and rhythm, S1, S2 normal, no murmur, click, rub or gallop  Breasts:   normal without suspicious masses, skin or nipple changes or axillary nodes  Abdomen:  normal findings: no organomegaly, soft, non-tender and no hernia  Pelvis:  External genitalia: furuncles in pubic area. Urinary system: urethral meatus normal and bladder without fullness, nontender Vaginal: normal without tenderness, induration or masses.  Positive discharge, scant. Cervix: absent Adnexa: not felt Uterus: absent   Lab Review Urine pregnancy test Labs reviewed yes Radiologic studies reviewed yes  Assessment:    Healthy female exam.  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 Follow up in: 1 year.   Meds ordered this encounter  Medications  . LORazepam (ATIVAN) 1 MG tablet    Sig: Take 1 mg by mouth every 8 (eight) hours.  . cyclobenzaprine (FLEXERIL) 10 MG tablet    Sig: Take 10 mg by mouth 3 (three) times daily as needed for muscle spasms.  . ARIPiprazole (ABILIFY) 20 MG tablet    Sig: Take 20 mg by mouth daily.  . clindamycin (CLEOCIN) 300 MG capsule    Sig: Take 1  capsule (300 mg total) by mouth 3 (three) times daily.    Dispense:  30 capsule    Refill:  5    Furuncles also present.   No orders of the defined types were placed in this encounter.

## 2014-03-10 ENCOUNTER — Other Ambulatory Visit: Payer: Self-pay | Admitting: Obstetrics

## 2014-03-10 DIAGNOSIS — N760 Acute vaginitis: Principal | ICD-10-CM

## 2014-03-10 DIAGNOSIS — B9689 Other specified bacterial agents as the cause of diseases classified elsewhere: Secondary | ICD-10-CM

## 2014-03-10 LAB — WET PREP BY MOLECULAR PROBE
CANDIDA SPECIES: NEGATIVE
GARDNERELLA VAGINALIS: POSITIVE — AB
Trichomonas vaginosis: NEGATIVE

## 2014-03-10 MED ORDER — METRONIDAZOLE 500 MG PO TABS: 500.0000 mg | ORAL_TABLET | Freq: Two times a day (BID) | ORAL | Status: DC

## 2014-04-04 ENCOUNTER — Encounter: Payer: Self-pay | Admitting: Obstetrics

## 2014-06-03 HISTORY — PX: BREAST LUMPECTOMY: SHX2

## 2014-09-14 ENCOUNTER — Ambulatory Visit (INDEPENDENT_AMBULATORY_CARE_PROVIDER_SITE_OTHER): Payer: Medicaid Other | Admitting: Neurology

## 2014-09-14 ENCOUNTER — Encounter: Payer: Self-pay | Admitting: Neurology

## 2014-09-14 VITALS — BP 157/86 | HR 84 | Ht 63.0 in | Wt 222.2 lb

## 2014-09-14 DIAGNOSIS — F319 Bipolar disorder, unspecified: Secondary | ICD-10-CM

## 2014-09-14 DIAGNOSIS — G441 Vascular headache, not elsewhere classified: Secondary | ICD-10-CM | POA: Diagnosis not present

## 2014-09-14 DIAGNOSIS — Z8673 Personal history of transient ischemic attack (TIA), and cerebral infarction without residual deficits: Secondary | ICD-10-CM | POA: Diagnosis not present

## 2014-09-14 HISTORY — DX: Personal history of transient ischemic attack (TIA), and cerebral infarction without residual deficits: Z86.73

## 2014-09-14 MED ORDER — TOPIRAMATE 25 MG PO TABS: ORAL_TABLET | ORAL | Status: DC

## 2014-09-14 NOTE — Patient Instructions (Addendum)
Topamax (topiramate) is a seizure medication that has an FDA approval for seizures and for migraine headache. Potential side effects of this medication include weight loss, cognitive slowing, tingling in the fingers and toes, and carbonated drinks will taste bad. If any significant side effects are noted on this drug, please contact our office.  Epilepsy Epilepsy is a disorder in which a person has repeated seizures over time. A seizure is a release of abnormal electrical activity in the brain. Seizures can cause a change in attention, behavior, or the ability to remain awake and alert (altered mental status). Seizures often involve uncontrollable shaking (convulsions).  Most people with epilepsy lead normal lives. However, people with epilepsy are at an increased risk of falls, accidents, and injuries. Therefore, it is important to begin treatment right away. CAUSES  Epilepsy has many possible causes. Anything that disturbs the normal pattern of brain cell activity can lead to seizures. This may include:   Head injury.  Birth trauma.  High fever as a child.  Stroke.  Bleeding into or around the brain.  Certain drugs.  Prolonged low oxygen, such as what occurs after CPR efforts.  Abnormal brain development.  Certain illnesses, such as meningitis, encephalitis (brain infection), malaria, and other infections.  An imbalance of nerve signaling chemicals (neurotransmitters).  SIGNS AND SYMPTOMS  The symptoms of a seizure can vary greatly from one person to another. Right before a seizure, you may have a warning (aura) that a seizure is about to occur. An aura may include the following symptoms:  Fear or anxiety.  Nausea.  Feeling like the room is spinning (vertigo).  Vision changes, such as seeing flashing lights or spots. Common symptoms during a seizure include:  Abnormal sensations, such as an abnormal smell or a bitter taste in the mouth.   Sudden, general body  stiffness.   Convulsions that involve rhythmic jerking of the face, arm, or leg on one or both sides.   Sudden change in consciousness.   Appearing to be awake but not responding.   Appearing to be asleep but cannot be awakened.   Grimacing, chewing, lip smacking, drooling, tongue biting, or loss of bowel or bladder control. After a seizure, you may feel sleepy for a while. DIAGNOSIS  Your health care provider will ask about your symptoms and take a medical history. Descriptions from any witnesses to your seizures will be very helpful in the diagnosis. A physical exam, including a detailed neurological exam, is necessary. Various tests may be done, such as:   An electroencephalogram (EEG). This is a painless test of your brain waves. In this test, a diagram is created of your brain waves. These diagrams can be interpreted by a specialist.  An MRI of the brain.   A CT scan of the brain.   A spinal tap (lumbar puncture, LP).  Blood tests to check for signs of infection or abnormal blood chemistry. TREATMENT  There is no cure for epilepsy, but it is generally treatable. Once epilepsy is diagnosed, it is important to begin treatment as soon as possible. For most people with epilepsy, seizures can be controlled with medicines. The following may also be used:  A pacemaker for the brain (vagus nerve stimulator) can be used for people with seizures that are not well controlled by medicine.  Surgery on the brain. For some people, epilepsy eventually goes away. HOME CARE INSTRUCTIONS   Follow your health care provider's recommendations on driving and safety in normal activities.  Get  enough rest. Lack of sleep can cause seizures.  Only take over-the-counter or prescription medicines as directed by your health care provider. Take any prescribed medicine exactly as directed.  Avoid any known triggers of your seizures.  Keep a seizure diary. Record what you recall about any  seizure, especially any possible trigger.   Make sure the people you live and work with know that you are prone to seizures. They should receive instructions on how to help you. In general, a witness to a seizure should:   Cushion your head and body.   Turn you on your side.   Avoid unnecessarily restraining you.   Not place anything inside your mouth.   Call for emergency medical help if there is any question about what has occurred.   Follow up with your health care provider as directed. You may need regular blood tests to monitor the levels of your medicine.  SEEK MEDICAL CARE IF:   You develop signs of infection or other illness. This might increase the risk of a seizure.   You seem to be having more frequent seizures.   Your seizure pattern is changing.  SEEK IMMEDIATE MEDICAL CARE IF:   You have a seizure that does not stop after a few moments.   You have a seizure that causes any difficulty in breathing.   You have a seizure that results in a very severe headache.   You have a seizure that leaves you with the inability to speak or use a part of your body.  Document Released: 05/20/2005 Document Revised: 03/10/2013 Document Reviewed: 12/30/2012 [facility] Patient Information 2015 [place], [place]. This information is not intended to replace advice given to you by your health care provider. Make sure you discuss any questions you have with your health care provider.
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
## 2015-02-06 ENCOUNTER — Other Ambulatory Visit: Payer: Self-pay | Admitting: Neurology

## 2015-02-24 ENCOUNTER — Other Ambulatory Visit: Payer: Self-pay

## 2015-02-24 DIAGNOSIS — Z1231 Encounter for screening mammogram for malignant neoplasm of breast: Secondary | ICD-10-CM

## 2015-03-01 ENCOUNTER — Ambulatory Visit
Admission: RE | Admit: 2015-03-01 | Discharge: 2015-03-01 | Disposition: A | Discharge status: Home / Self Care | Payer: Medicaid Other | Source: Ambulatory Visit

## 2015-03-01 DIAGNOSIS — Z1231 Encounter for screening mammogram for malignant neoplasm of breast: Secondary | ICD-10-CM

## 2015-03-13 ENCOUNTER — Other Ambulatory Visit: Payer: Self-pay | Admitting: Internal Medicine

## 2015-03-13 DIAGNOSIS — R928 Other abnormal and inconclusive findings on diagnostic imaging of breast: Secondary | ICD-10-CM

## 2015-03-16 ENCOUNTER — Encounter: Payer: Self-pay | Admitting: Neurology

## 2015-03-16 ENCOUNTER — Ambulatory Visit (INDEPENDENT_AMBULATORY_CARE_PROVIDER_SITE_OTHER): Payer: Medicaid Other | Admitting: Neurology

## 2015-03-16 VITALS — BP 118/80 | HR 78 | Ht 63.0 in | Wt 215.0 lb

## 2015-03-16 DIAGNOSIS — Z8673 Personal history of transient ischemic attack (TIA), and cerebral infarction without residual deficits: Secondary | ICD-10-CM

## 2015-03-16 DIAGNOSIS — R569 Unspecified convulsions: Secondary | ICD-10-CM

## 2015-03-16 HISTORY — DX: Unspecified convulsions: R56.9

## 2015-03-16 MED ORDER — BACLOFEN 20 MG PO TABS: 20.0000 mg | ORAL_TABLET | Freq: Two times a day (BID) | ORAL | Status: DC

## 2015-03-16 MED ORDER — TOPIRAMATE 50 MG PO TABS: 50.0000 mg | ORAL_TABLET | Freq: Two times a day (BID) | ORAL | Status: DC

## 2015-03-16 NOTE — Progress Notes (Signed)
Reason for visit: Seizures  April [name] is an 49 y.o. female  History of present illness:  April [name] is a 49 year old right-handed white female with a history of bipolar disorder and a prior right brain stroke. The patient has seizures secondary to this. The patient was taken off of Depakote secondary to weight gain, and she was placed on Topamax. The patient has had a significant improvement in her headaches and she has not had any recurrent seizures since last seen. The last seizure was on 08/27/2014. The patient has lost 10 pounds on the Topamax. She is having no side effects on the drug. She returns to this office for an evaluation. She does have some problems with muscle cramps, she is now on baclofen taking 20 mg twice daily for this.  Past Medical History  Diagnosis Date  . Hypertension   . Bipolar 1 disorder ([place])   . Schizo affective schizophrenia ([place])   . Depression, major ([place])   . Hyperlipidemia 05/06/2012  . Tobacco abuse 05/06/2012  . Depression 05/06/2012  . Stroke ([place])     05/02/12  . Obesity   . History of stroke 09/14/2014  . Convulsions/seizures ([place]) 03/16/2015    Past Surgical History  Procedure Laterality Date  . Cholecystectomy    . Abdominal hysterectomy    . Tee without cardioversion  05/06/2012    Procedure: TRANSESOPHAGEAL ECHOCARDIOGRAM (TEE);  Surgeon: Larey Dresser, MD;  Location: [facility] ENDOSCOPY;  Service: Cardiovascular;  Laterality: N/A;    Family History  Problem Relation Age of Onset  . Multiple sclerosis Sister   . COPD Sister   . COPD Sister   . Anxiety disorder Brother   . COPD Mother   . COPD Father     Social history:  reports that she has been smoking Cigarettes.  She has been smoking about 0.50 packs per day. She has never used smokeless tobacco. She reports that she does not drink alcohol or use illicit drugs.    Allergies  Allergen Reactions  . Morphine And Related Hives and Itching  . Hydrocodone Rash    Medications:    Prior to Admission medications   Medication Sig Start Date End Date Taking? Authorizing Provider  ARIPiprazole (ABILIFY) 20 MG tablet Take 20 mg by mouth daily.   Yes Historical Provider, MD  aspirin 81 MG chewable tablet Chew 1 tablet (81 mg total) by mouth daily. 05/14/12  Yes Orson Eva, MD  atorvastatin (LIPITOR) 20 MG tablet Take 1 tablet (20 mg total) by mouth daily at 6 PM. 05/14/12  Yes Orson Eva, MD  citalopram (CELEXA) 20 MG tablet Take 30 mg by mouth daily.   Yes Historical Provider, MD  gabapentin (NEURONTIN) 300 MG capsule Take 300 mg by mouth 3 (three) times daily.   Yes Historical Provider, MD  LORazepam (ATIVAN) 1 MG tablet Take 1 mg by mouth every 8 (eight) hours.   Yes Historical Provider, MD  metoprolol succinate (TOPROL-XL) 50 MG 24 hr tablet Take 50 mg by mouth daily. Take with or immediately following a meal.   Yes Historical Provider, MD  metroNIDAZOLE (FLAGYL) 500 MG tablet Take 1 tablet (500 mg total) by mouth 2 (two) times daily. 03/10/14  Yes Shelly Bombard, MD  QUEtiapine (SEROQUEL XR) 150 MG 24 hr tablet Take 1 tablet (150 mg total) by mouth daily at 8 pm. 05/14/12  Yes Orson Eva, MD  baclofen (LIORESAL) 20 MG tablet Take 1 tablet (20 mg total) by mouth 2 (two)  times daily. 03/16/15   Kathrynn Ducking, MD  topiramate (TOPAMAX) 50 MG tablet Take 1 tablet (50 mg total) by mouth 2 (two) times daily. 03/16/15   Kathrynn Ducking, MD    ROS:  Out of a complete 14 system review of symptoms, the patient complains only of the following symptoms, and all other reviewed systems are negative.  History of seizures Headache Leg cramps  Blood pressure 118/80, pulse 78, height 5\' 3"  (1.6 m), weight 215 lb (97.523 kg).  Physical Exam  General: The patient is alert and cooperative at the time of the examination.  Skin: No significant peripheral edema is noted.   Neurologic Exam  Mental status: The patient is alert and oriented x 3 at the time of the examination. The  patient has apparent normal recent and remote memory, with an apparently normal attention span and concentration ability.   Cranial nerves: Facial symmetry is present. Speech is normal, no aphasia or dysarthria is noted. Extraocular movements are full. Visual fields are full. Masking of the face is seen.  Motor: The patient has good strength in all 4 extremities.  Sensory examination: Soft touch sensation is symmetric on the face, arms, and legs.  Coordination: The patient has good finger-nose-finger and heel-to-shin bilaterally.  Gait and station: The patient has a normal gait. Tandem gait is normal. Romberg is negative. No drift is seen.  Reflexes: Deep tendon reflexes are symmetric.   Assessment/Plan:  1. History of right brain stroke  2. Seizures  3. Headache  4. Bipolar disorder  5. Obesity  The patient is doing better on the Topamax at 50 mg twice daily. I will write a prescription today for the medication. The patient may return back to driving. The patient will contact our office if any further problems arise, otherwise she will follow-up in 6 months, sooner if needed.  Jill Alexanders MD 03/16/2015 6:38 PM  [facility] [address] [place], [place] [address]  Phone [phone] Fax [phone]

## 2015-03-16 NOTE — Patient Instructions (Signed)

## 2015-03-20 ENCOUNTER — Other Ambulatory Visit: Payer: Self-pay | Admitting: Internal Medicine

## 2015-03-20 ENCOUNTER — Ambulatory Visit
Admission: RE | Admit: 2015-03-20 | Discharge: 2015-03-20 | Disposition: A | Discharge status: Home / Self Care | Payer: Medicaid Other | Source: Ambulatory Visit | Attending: Internal Medicine | Admitting: Internal Medicine

## 2015-03-20 DIAGNOSIS — R928 Other abnormal and inconclusive findings on diagnostic imaging of breast: Secondary | ICD-10-CM

## 2015-03-27 ENCOUNTER — Ambulatory Visit
Admission: RE | Admit: 2015-03-27 | Discharge: 2015-03-27 | Disposition: A | Discharge status: Home / Self Care | Payer: Medicaid Other | Source: Ambulatory Visit | Attending: Internal Medicine | Admitting: Internal Medicine

## 2015-03-27 ENCOUNTER — Other Ambulatory Visit: Payer: Self-pay | Admitting: Internal Medicine

## 2015-03-27 DIAGNOSIS — R928 Other abnormal and inconclusive findings on diagnostic imaging of breast: Secondary | ICD-10-CM

## 2015-03-29 ENCOUNTER — Telehealth: Payer: Self-pay | Admitting: *Deleted

## 2015-03-29 DIAGNOSIS — C50411 Malignant neoplasm of upper-outer quadrant of right female breast: Secondary | ICD-10-CM

## 2015-03-29 NOTE — Telephone Encounter (Signed)
Confirmed [facility] for 04/05/15 at 1230 .  Instructions and contact information given.

## 2015-04-05 ENCOUNTER — Ambulatory Visit: Payer: Medicaid Other | Attending: General Surgery | Admitting: Physical Therapy

## 2015-04-05 ENCOUNTER — Other Ambulatory Visit (HOSPITAL_BASED_OUTPATIENT_CLINIC_OR_DEPARTMENT_OTHER): Payer: Medicaid Other

## 2015-04-05 ENCOUNTER — Other Ambulatory Visit: Payer: Self-pay | Admitting: General Surgery

## 2015-04-05 ENCOUNTER — Encounter: Payer: Self-pay | Admitting: Hematology and Oncology

## 2015-04-05 ENCOUNTER — Ambulatory Visit (HOSPITAL_BASED_OUTPATIENT_CLINIC_OR_DEPARTMENT_OTHER): Payer: Medicaid Other | Admitting: Hematology and Oncology

## 2015-04-05 ENCOUNTER — Ambulatory Visit
Admission: RE | Admit: 2015-04-05 | Discharge: 2015-04-05 | Disposition: A | Discharge status: Home / Self Care | Payer: Medicaid Other | Source: Ambulatory Visit | Attending: Radiation Oncology | Admitting: Radiation Oncology

## 2015-04-05 VITALS — BP 127/63 | HR 71 | Temp 98.4°F | Resp 18 | Ht 63.0 in | Wt 219.0 lb

## 2015-04-05 DIAGNOSIS — C50411 Malignant neoplasm of upper-outer quadrant of right female breast: Secondary | ICD-10-CM

## 2015-04-05 DIAGNOSIS — Z720 Tobacco use: Secondary | ICD-10-CM

## 2015-04-05 DIAGNOSIS — C50911 Malignant neoplasm of unspecified site of right female breast: Secondary | ICD-10-CM

## 2015-04-05 DIAGNOSIS — Z170 Estrogen receptor positive status [ER+]: Secondary | ICD-10-CM

## 2015-04-05 DIAGNOSIS — F1290 Cannabis use, unspecified, uncomplicated: Secondary | ICD-10-CM

## 2015-04-05 DIAGNOSIS — R293 Abnormal posture: Secondary | ICD-10-CM

## 2015-04-05 LAB — COMPREHENSIVE METABOLIC PANEL (CC13)
ALBUMIN: 3.7 g/dL (ref 3.5–5.0)
ALT: 39 U/L (ref 0–55)
AST: 34 U/L (ref 5–34)
Alkaline Phosphatase: 165 U/L — ABNORMAL HIGH (ref 40–150)
Anion Gap: 5 mEq/L (ref 3–11)
BUN: 10.1 mg/dL (ref 7.0–26.0)
CO2: 22 mEq/L (ref 22–29)
Calcium: 9 mg/dL (ref 8.4–10.4)
Chloride: 112 mEq/L — ABNORMAL HIGH (ref 98–109)
Creatinine: 0.9 mg/dL (ref 0.6–1.1)
EGFR: 73 mL/min/{1.73_m2} — ABNORMAL LOW (ref >90)
GLUCOSE: 93 mg/dL (ref 70–140)
POTASSIUM: 4.1 meq/L (ref 3.5–5.1)
Sodium: 140 mEq/L (ref 136–145)
Total Bilirubin: <0.3 mg/dL (ref 0.20–1.20)
Total Protein: 6.8 g/dL (ref 6.4–8.3)

## 2015-04-05 LAB — CBC WITH DIFFERENTIAL/PLATELET
BASO%: 1.4 % (ref 0.0–2.0)
Basophils Absolute: 0.1 10*3/uL (ref 0.0–0.1)
EOS%: 1.8 % (ref 0.0–7.0)
Eosinophils Absolute: 0.2 10*3/uL (ref 0.0–0.5)
HCT: 40.2 % (ref 34.8–46.6)
HEMOGLOBIN: 13.4 g/dL (ref 11.6–15.9)
LYMPH%: 40.2 % (ref 14.0–49.7)
MCH: 30.1 pg (ref 25.1–34.0)
MCHC: 33.2 g/dL (ref 31.5–36.0)
MCV: 90.7 fL (ref 79.5–101.0)
MONO#: 0.6 10*3/uL (ref 0.1–0.9)
MONO%: 6.4 % (ref 0.0–14.0)
NEUT%: 50.2 % (ref 38.4–76.8)
NEUTROS ABS: 4.6 10*3/uL (ref 1.5–6.5)
Platelets: 229 10*3/uL (ref 145–400)
RBC: 4.44 10*6/uL (ref 3.70–5.45)
RDW: 14.1 % (ref 11.2–14.5)
WBC: 9.3 10*3/uL (ref 3.9–10.3)
lymph#: 3.7 10*3/uL — ABNORMAL HIGH (ref 0.9–3.3)

## 2015-04-05 NOTE — Progress Notes (Signed)
   [facility] Psychosocial Distress Screening Counseling Intern  Counseling Intern was referred by distress screening protocol.  The patient scored a 10 on the Psychosocial Distress Thermometer which indicates severe distress. Counseling Intern Vaughan Sine to assess for distress and other psychosocial needs.    [facility] DISTRESS SCREENING 04/05/2015  Screening Type Initial Screening  Distress experienced in past week (1-10) 10  Emotional problem type Depression;Nervousness/Anxiety;Adjusting to illness  Physical Problem type Nausea/vomiting;Sleep/insomnia  Referral to support programs Yes   Counseling Intern Note:  Met with pt and brother Lennette Bihari during [facility]. Pt reported a distress level of 10 on her initial screening, which had reduced to a 4 by the time of our encounter. Pt stated that the reduction in distress was due to learning more information about her Dx and learning that she has a favorable prognosis. Pt reported that the nervousness/anxiety indicated on her distress screening are issues that she struggles with independent of her diagnosis and are bing managed by her PCP. When asked about support systems, Pt stated that she has many supportive friends and family that are aware of her Dx.   Counseling Intern introduced and provided information about resources and services available in the support center.   No support center follow up is indicated at this time.   Vaughan Sine Counseling Intern [phone]

## 2015-04-05 NOTE — Progress Notes (Signed)
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 female who is Seen today through the courtesy Dr. Donne Hazel at the breast multidisciplinary clinic for evaluation of her T1a N0 invasive ductal carcinoma the right breast.  At the time of a screening mammogram on 03/01/2015 she was felt to have a possible right breast mass within the upper-outer quadrant of the right breast.  Additional views on 03/20/2015 showed a spiculated mass within the upper-outer quadrant of the right breast measuring 0.4 cm.  Ultrasound at 10:00 showed a mass measuring 0.4 -0.5 cm.  Ultrasound-guided biopsy on 03/27/2015 was diagnostic for invasive ductal carcinoma which was ER positive at 100%, PR positive at 90% with a Ki-67 of 5%.  Her carcinoma was felt to be grade 1.  HER-2/neu was not amplified.  She seen today with Dr. Donne Hazel and Dr. Lindi Adie.   PREVIOUS RADIATION THERAPY: No   PAST MEDICAL HISTORY:  has a past medical history of Hypertension; Bipolar 1 disorder ([place]); Schizo affective schizophrenia ([place]); Depression, major ([place]); Hyperlipidemia (05/06/2012); Tobacco abuse (05/06/2012); Depression (05/06/2012); Stroke [facility]); Obesity; History of stroke (09/14/2014); Convulsions/seizures ([place]) (03/16/2015); Breast cancer ([place]); and Anxiety.     PAST SURGICAL HISTORY:  Past Surgical History  Procedure Laterality Date  . Cholecystectomy    . Abdominal hysterectomy    . Tee without cardioversion  05/06/2012    Procedure: TRANSESOPHAGEAL ECHOCARDIOGRAM (TEE);  Surgeon: Larey Dresser, MD;  Location: [facility] ENDOSCOPY;  Service: Cardiovascular;  Laterality: N/A;      FAMILY HISTORY: family history includes Anxiety disorder in her brother; COPD in her father, mother, sister, and sister; Multiple sclerosis in her sister.  Her mother died from complications of COPD at 36.  Her father died from complications of COPD at 64.  No family history of breast cancer.   SOCIAL HISTORY:  reports that she has been smoking Cigarettes.  She has been smoking about 0.50 packs per day. She has never used smokeless tobacco. She reports that she does not drink alcohol or use illicit drugs.  Separated, 3 children.  She lives with her brother.  She is on disability because of her bipolar/depressive illness diagnosed in her 69s.   ALLERGIES: Morphine and related and Hydrocodone   MEDICATIONS:  Current Outpatient Prescriptions  Medication Sig Dispense Refill  . ARIPiprazole (ABILIFY) 20 MG tablet Take 20 mg by mouth daily.    Marland Kitchen aspirin 81 MG chewable tablet Chew 1 tablet (81 mg total) by mouth daily.    Marland Kitchen atorvastatin (LIPITOR) 20 MG tablet Take 1 tablet (20 mg total) by mouth daily at 6 PM. 30 tablet 2  . baclofen (LIORESAL) 20 MG tablet Take 1 tablet (20 mg total) by mouth 2 (two) times daily.    . citalopram (CELEXA) 20 MG tablet Take 30 mg by mouth daily.    Marland Kitchen gabapentin (NEURONTIN) 300 MG capsule Take 300 mg by mouth 3 (three) times daily.    Marland Kitchen LORazepam (ATIVAN) 1 MG tablet Take 1 mg by mouth every 8 (eight) hours.    . metoprolol succinate (TOPROL-XL) 50 MG 24  hr tablet Take 50 mg by mouth daily. Take with or immediately following a meal.    . QUEtiapine (SEROQUEL XR) 150 MG 24 hr tablet Take 1 tablet (150 mg total) by mouth daily at 8 pm. (Patient taking differently: Take 300 mg by mouth daily at 8 pm. ) 30 each 2  . solifenacin (VESICARE) 5 MG tablet Take 5 mg by mouth daily.    Marland Kitchen topiramate (TOPAMAX) 25 MG tablet ONE TABLET TWICE A DAY FOR 2 WEEKS, THEN TAKE 2 TABLETS TWICE A DAY  1  . topiramate (TOPAMAX) 50 MG tablet Take 1 tablet (50 mg total) by mouth 2 (two)  times daily. (Patient not taking: Reported on 04/05/2015) 180 tablet 1   No current facility-administered medications for this encounter.     REVIEW OF SYSTEMS:  Pertinent items are noted in HPI.    PHYSICAL EXAM: Alert and oriented 49 year old white female with a somewhat flat affect. Wt Readings from Last 3 Encounters:  04/05/15 219 lb (99.338 kg)  03/16/15 215 lb (97.523 kg)  09/14/14 222 lb 3.2 oz (100.789 kg)   Temp Readings from Last 3 Encounters:  04/05/15 98.4 F (36.9 C) Oral  03/09/14 98.1 F (36.7 C)   05/16/13 98.1 F (36.7 C) Oral   BP Readings from Last 3 Encounters:  04/05/15 127/63  03/16/15 118/80  09/14/14 157/86   Pulse Readings from Last 3 Encounters:  04/05/15 71  03/16/15 78  09/14/14 84   Nodes: There is no palpable cervical, supraclavicular, or axillary lymphadenopathy.  Breasts: There is a punctate biopsy wound along the upper outer quadrant of the right breast at approximately 9 to 10:00.  No masses are appreciated.  Left breast without masses or lesions.  Extremities: Without edema.    LABORATORY DATA:  Lab Results  Component Value Date   WBC 9.3 04/05/2015   HGB 13.4 04/05/2015   HCT 40.2 04/05/2015   MCV 90.7 04/05/2015   PLT 229 04/05/2015   Lab Results  Component Value Date   NA 140 04/05/2015   K 4.1 04/05/2015   CL 97 05/11/2013   CO2 22 04/05/2015   Lab Results  Component Value Date   ALT 39 04/05/2015   AST 34 04/05/2015   ALKPHOS 165* 04/05/2015   BILITOT <0.30 04/05/2015      IMPRESSION: [place] stage I A (T1a N0 M0) invasive ductal carcinoma the right breast.  She visits [place] today with her brother.  I discussed local management options which include lumpectomy followed by radiation therapy or mastectomy along with a sentinel lymph node biopsy.  She is interested in breast preservation.  I discussed the potential acute and late toxicities of radiation therapy.  I feel that she would probably be a candidate for  hypofractionated radiation therapy.  PLAN: As discussed above.  She will be represented on Wednesday morning following her definitive surgery.  I spent 30  minutes face to face with the patient and more than 50% of that time was spent in counseling and/or coordination of care.

## 2015-04-05 NOTE — Assessment & Plan Note (Signed)
Right breast biopsy 10:00 position 03/27/2015: 8 cm from the nipple, invasive ductal carcinoma grade 1, ER/PR positive, HER-2 negative, ratio 1.14, Ki-67 5%, T1a N0 stage IA clinical stage, 5 mm focus by ultrasound  Pathology and radiology counseling:Discussed with the patient, the details of pathology including the type of breast cancer,the clinical staging, the significance of ER, PR and HER-2/neu receptors and the implications for treatment. After reviewing the pathology in detail, we proceeded to discuss the different treatment options between surgery, radiation, chemotherapy, antiestrogen therapies.  Recommendations: 1. Breast conserving surgery followed by 2. Oncotype DX testing only if the tumor is greater than 5 mm in size 3. Adjuvant radiation therapy followed by 4. Adjuvant antiestrogen therapy  Return to clinic after surgery to discuss final pathology report and then determine if Oncotype DX testing will need to be sent.

## 2015-04-05 NOTE — Patient Instructions (Signed)

## 2015-04-05 NOTE — Progress Notes (Signed)
[place] NOTE  Patient Care Team: Nolene Ebbs, MD as PCP - General (Internal Medicine) Rolm Bookbinder, MD as Consulting Physician (General Surgery) Nicholas Lose, MD as Consulting Physician (Hematology and Oncology) Arloa Koh, MD as Consulting Physician (Radiation Oncology)  CHIEF COMPLAINTS/PURPOSE OF CONSULTATION:  Newly diagnosed breast cancer  HISTORY OF PRESENTING ILLNESS:  April [name] 49 y.o. female is here because of recent diagnosis of right breast cancer. She had a routine screening mammogram which revealed a right breast mass upper outer quadrant at 10:00 position measuring 5 mm in size. She did not have any pain discomfort or any other concerns in the breast. Biopsy was performed which came back as grade 1 invasive ductal carcinoma that was ER/PR positive HER-2 negative with a Ki-67 of 5%. She was presented at the multidisciplinary tumor board and she is here today to discuss a treatment plan. She is accompanied by her brother. She lives with her brother. She smokes cigarettes and marijuana. She has a prior history of stroke.  I reviewed her records extensively and collaborated the history with the patient.  SUMMARY OF ONCOLOGIC HISTORY:   [name] ([place])   03/20/2015 Mammogram Right breast mass upper outer quadrant 4-5 mm focus, T1a N0 stage IA clinical stage, ultrasound axilla normal-appearing lymph nodes   04/05/2015 Initial Diagnosis Right breast biopsy 10:00 position: 8 cm from the nipple, invasive ductal carcinoma grade 1, ER/PR positive, HER-2 negative, ratio 1.14, Ki-67 5%    In terms of breast cancer risk profile:  She menarched at early age of 66   She had 3 pregnancy, her first child was born at age 72  She has not received birth control pills.  She was never exposed to fertility medications or hormone replacement therapy.  She has no family history of Breast/GYN/GI  cancer  MEDICAL HISTORY:  Past Medical History  Diagnosis Date  . Hypertension   . Bipolar 1 disorder ([place])   . Schizo affective schizophrenia ([place])   . Depression, major ([place])   . Hyperlipidemia 05/06/2012  . Tobacco abuse 05/06/2012  . Depression 05/06/2012  . Stroke ([place])     05/02/12  . Obesity   . History of stroke 09/14/2014  . Convulsions/seizures ([place]) 03/16/2015  . Breast cancer ([place])   . Anxiety     SURGICAL HISTORY: Past Surgical History  Procedure Laterality Date  . Cholecystectomy    . Abdominal hysterectomy    . Tee without cardioversion  05/06/2012    Procedure: TRANSESOPHAGEAL ECHOCARDIOGRAM (TEE);  Surgeon: Larey Dresser, MD;  Location: [facility];  Service: Cardiovascular;  Laterality: N/A;    SOCIAL HISTORY: Social History   Social History  . Marital Status: Legally Separated    Spouse Name: N/A  . Number of Children: 3  . Years of Education: 12   Occupational History  . disabled    Social History Main Topics  . Smoking status: Current Every Day Smoker -- 0.50 packs/day    Types: Cigarettes  . Smokeless tobacco: Never Used  . Alcohol Use: No     Comment: ocassional  . Drug Use: No  . Sexual Activity: Not Currently   Other Topics Concern  . Not on file   Social History Narrative   Patient  lives at home with her brother(Kevin). Patient is disabled and has three children. Patient is sep rated  And high school education.   Patient is right handed.   Patient drinks 3-4 cups  of caffeine daily.    FAMILY HISTORY: Family History  Problem Relation Age of Onset  . Multiple sclerosis Sister   . COPD Sister   . COPD Sister   . Anxiety disorder Brother   . COPD Mother   . COPD Father     ALLERGIES:  is allergic to morphine and related and hydrocodone.  MEDICATIONS:  Current Outpatient Prescriptions  Medication Sig Dispense Refill  . ARIPiprazole (ABILIFY) 20 MG tablet Take 20 mg by mouth daily.    Marland Kitchen aspirin 81 MG chewable tablet Chew  1 tablet (81 mg total) by mouth daily.    Marland Kitchen atorvastatin (LIPITOR) 20 MG tablet Take 1 tablet (20 mg total) by mouth daily at 6 PM. 30 tablet 2  . baclofen (LIORESAL) 20 MG tablet Take 1 tablet (20 mg total) by mouth 2 (two) times daily.    . citalopram (CELEXA) 20 MG tablet Take 30 mg by mouth daily.    Marland Kitchen gabapentin (NEURONTIN) 300 MG capsule Take 300 mg by mouth 3 (three) times daily.    Marland Kitchen LORazepam (ATIVAN) 1 MG tablet Take 1 mg by mouth every 8 (eight) hours.    . metoprolol succinate (TOPROL-XL) 50 MG 24 hr tablet Take 50 mg by mouth daily. Take with or immediately following a meal.    . QUEtiapine (SEROQUEL XR) 150 MG 24 hr tablet Take 1 tablet (150 mg total) by mouth daily at 8 pm. (Patient taking differently: Take 300 mg by mouth daily at 8 pm. ) 30 each 2  . solifenacin (VESICARE) 5 MG tablet Take 5 mg by mouth daily.    Marland Kitchen topiramate (TOPAMAX) 25 MG tablet ONE TABLET TWICE A DAY FOR 2 WEEKS, THEN TAKE 2 TABLETS TWICE A DAY  1  . topiramate (TOPAMAX) 50 MG tablet Take 1 tablet (50 mg total) by mouth 2 (two) times daily. (Patient not taking: Reported on 04/05/2015) 180 tablet 1   No current facility-administered medications for this visit.    REVIEW OF SYSTEMS:   Constitutional: Denies fevers, chills or abnormal night sweats Eyes: Denies blurriness of vision, double vision or watery eyes Ears, nose, mouth, throat, and face: Denies mucositis or sore throat Respiratory: Denies cough, dyspnea or wheezes Cardiovascular: Denies palpitation, chest discomfort or lower extremity swelling Gastrointestinal:  Denies nausea, heartburn or change in bowel habits Skin: Denies abnormal skin rashes Lymphatics: Denies new lymphadenopathy or easy bruising Neurological:Denies numbness, tingling or new weaknesses Behavioral/Psych: Mood is stable, no new changes  Breast:  Denies any palpable lumps or discharge All other systems were reviewed with the patient and are negative.  PHYSICAL  EXAMINATION: ECOG PERFORMANCE STATUS: 0 - Asymptomatic  Filed Vitals:   04/05/15 1305  BP: 127/63  Pulse: 71  Temp: 98.4 F (36.9 C)  Resp: 18   Filed Weights   04/05/15 1305  Weight: 219 lb (99.338 kg)    GENERAL:alert, no distress and comfortable SKIN: skin color, texture, turgor are normal, no rashes or significant lesions EYES: normal, conjunctiva are pink and non-injected, sclera clear OROPHARYNX:no exudate, no erythema and lips, buccal mucosa, and tongue normal  NECK: supple, thyroid normal size, non-tender, without nodularity LYMPH:  no palpable lymphadenopathy in the cervical, axillary or inguinal LUNGS: clear to auscultation and percussion with normal breathing effort HEART: regular rate & rhythm and no murmurs and no lower extremity edema ABDOMEN:abdomen soft, non-tender and normal bowel sounds Musculoskeletal:no cyanosis of digits and no clubbing  PSYCH: alert & oriented x 3 with fluent speech NEURO:  no focal motor/sensory deficits BREAST: No palpable nodules in breast. No palpable axillary or supraclavicular lymphadenopathy (exam performed in the presence of a chaperone)   LABORATORY DATA:  I have reviewed the data as listed Lab Results  Component Value Date   WBC 9.3 04/05/2015   HGB 13.4 04/05/2015   HCT 40.2 04/05/2015   MCV 90.7 04/05/2015   PLT 229 04/05/2015   Lab Results  Component Value Date   NA 140 04/05/2015   K 4.1 04/05/2015   CL 97 05/11/2013   CO2 22 04/05/2015   ASSESSMENT AND PLAN:  [name] ([place]) Right breast biopsy 10:00 position 03/27/2015: 8 cm from the nipple, invasive ductal carcinoma grade 1, ER/PR positive, HER-2 negative, ratio 1.14, Ki-67 5%, T1a N0 stage IA clinical stage, 5 mm focus by ultrasound  Pathology and radiology counseling:Discussed with the patient, the details of pathology including the type of breast cancer,the clinical staging, the significance of ER, PR and  HER-2/neu receptors and the implications for treatment. After reviewing the pathology in detail, we proceeded to discuss the different treatment options between surgery, radiation, chemotherapy, antiestrogen therapies.  Recommendations: 1. Breast conserving surgery followed by 2. Oncotype DX testing only if the tumor is greater than 5 mm in size 3. Adjuvant radiation therapy followed by 4. Adjuvant antiestrogen therapy  Return to clinic after surgery to discuss final pathology report and then determine if Oncotype DX testing will need to be sent.  All questions were answered. The patient knows to call the clinic with any problems, questions or concerns.    Rulon Eisenmenger, MD 3:47 PM

## 2015-04-05 NOTE — Therapy (Signed)
[place], [place], [address] Phone: [phone]   Fax:  [phone]  Physical Therapy Evaluation  Patient Details  Name: April [name] MRN: [id number] Date of Birth: [birth year] Referring Provider: Dr. Rolm Bookbinder  Encounter Date: 04/05/2015      PT End of Session - 04/05/15 1410    Number of Visits 1   PT Start Time 6962   PT Stop Time 1337   PT Time Calculation (min) 25 min   Activity Tolerance Patient tolerated treatment well   Behavior During Therapy [facility] for tasks assessed/performed      Past Medical History  Diagnosis Date  . Hypertension   . Bipolar 1 disorder ([employer])   . Schizo affective schizophrenia ([place])   . Depression, major ([place])   . Hyperlipidemia 05/06/2012  . Tobacco abuse 05/06/2012  . Depression 05/06/2012  . Stroke ([place])     05/02/12  . Obesity   . History of stroke 09/14/2014  . Convulsions/seizures ([place]) 03/16/2015  . Breast cancer ([place])   . Anxiety     Past Surgical History  Procedure Laterality Date  . Cholecystectomy    . Abdominal hysterectomy    . Tee without cardioversion  05/06/2012    Procedure: TRANSESOPHAGEAL ECHOCARDIOGRAM (TEE);  Surgeon: Larey Dresser, MD;  Location: [facility];  Service: Cardiovascular;  Laterality: N/A;    There were no vitals filed for this visit.  Visit Diagnosis:  Malignant neoplasm of right female breast, unspecified site of breast [facility]) - Plan: PT plan of care cert/re-cert  Abnormal posture      Subjective Assessment - 04/05/15 1402    Subjective Patient was seen today for a baseline assessment of her newly diagnosed right breast cancer.   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  Her Ki67 is 5% and the mass measures 5 mm.   Patient Stated Goals Reduce lymphedema risk and learn post op shoulder  ROM HEP.   Currently in Pain? Yes   Pain Score 10-Worst pain ever   Pain Location Knee   Pain Orientation Right;Left   Pain Descriptors / Indicators Aching   Pain Type Chronic pain   Pain Onset More than a month ago   Pain Frequency Intermittent   Aggravating Factors  weight bearing   Pain Relieving Factors rest   Multiple Pain Sites No            [facility] PT Assessment - 04/05/15 0001    Assessment   Medical Diagnosis Right breast cancer   Referring Provider Dr. Rolm Bookbinder   Onset Date/Surgical Date 03/01/15   Hand Dominance Right   Prior Therapy none   Precautions   Precautions Other (comment)  Active breast cancer   Restrictions   Weight Bearing Restrictions No   Balance Screen   Has the patient fallen in the past 6 months No   Has the patient had a decrease in activity level because of a fear of falling?  No   Is the patient reluctant to leave their home because of a fear of falling?  No   Home Environment   Living Environment Private residence   Living Arrangements Other relatives  Brother   Available Help at Discharge Family   Prior Function   Level of [place] On disability   Leisure She does not exercise   Cognition  Overall Cognitive Status Within Functional Limits for tasks assessed   Posture/Postural Control   Posture/Postural Control Postural limitations   Postural Limitations Rounded Shoulders;Forward head   ROM / Strength   AROM / PROM / Strength AROM;Strength   AROM   AROM Assessment Site Shoulder   Right/Left Shoulder Right;Left   Right Shoulder Extension 40 Degrees   Right Shoulder Flexion 153 Degrees   Right Shoulder ABduction 145 Degrees   Right Shoulder Internal Rotation 57 Degrees   Right Shoulder External Rotation 66 Degrees   Left Shoulder Extension 52 Degrees   Left Shoulder Flexion 137 Degrees   Left Shoulder ABduction 145 Degrees   Left Shoulder Internal Rotation 45 Degrees   Left Shoulder External  Rotation 75 Degrees   Strength   Overall Strength Within functional limits for tasks performed           LYMPHEDEMA/ONCOLOGY QUESTIONNAIRE - 04/05/15 1408    Type   Cancer Type Right breast cancer   Lymphedema Assessments   Lymphedema Assessments Upper extremities   Right Upper Extremity Lymphedema   10 cm Proximal to Olecranon Process 36.6 cm   Olecranon Process 29.6 cm   10 cm Proximal to Ulnar Styloid Process 27.5 cm   Just Proximal to Ulnar Styloid Process 19.2 cm   Across Hand at [employer] 20.7 cm   At [place] of 2nd Digit 7.4 cm   Left Upper Extremity Lymphedema   10 cm Proximal to Olecranon Process 36.8 cm   Olecranon Process 30.3 cm   10 cm Proximal to Ulnar Styloid Process 26 cm   Just Proximal to Ulnar Styloid Process 19 cm   Across Hand at [employer] 19.9 cm   At [place] of 2nd Digit 7.2 cm      Patient was instructed today in a home exercise program today for post op shoulder range of motion. These included active assist shoulder flexion in sitting, scapular retraction, wall walking with shoulder abduction, and hands behind head external rotation.  She was encouraged to do these twice a day, holding 3 seconds and repeating 5 times when permitted by her physician.         PT Education - 04/05/15 1409    Education provided Yes   Education Details Lymphedema risk reduction and post op shoulder ROM HEP   Person(s) Educated Patient;Other (comment)  brother   Methods Explanation;Demonstration;Handout   Comprehension Verbalized understanding;Returned demonstration              Breast Clinic Goals - 04/05/15 1438    Patient will be able to verbalize understanding of pertinent lymphedema risk reduction practices relevant to her diagnosis specifically related to skin care.   Baseline no knowledge   Time 1   Period Days   Status Achieved   Patient will be able to return demonstrate and/or verbalize understanding of the post-op home exercise program  related to regaining shoulder range of motion.   Baseline no knowledge   Time 1   Period Days   Status Achieved   Patient will be able to verbalize understanding of the importance of attending the postoperative After Breast Cancer Class for further lymphedema risk reduction education and therapeutic exercise.   Baseline no knowledge   Time 1   Period Days   Status Achieved              Plan - 04/05/15 1410    Clinical Impression Statement Patient was diagnosed on 03/01/15 with right ER/PR positive, HER2 negative  grade 1 invasive ductal carcinoma.  Her Ki67 is 5% and the mass measures 5 mm.  She is planning to have a right lumpectomy and sentinel node biopsy followed by radiaiton and anti-estrogen therapy.  She will benefit from post op PT to regain shoulder ROM and strength.   Pt will benefit from skilled therapeutic intervention in order to improve on the following deficits Decreased strength;Decreased scar mobility;Pain;Decreased knowledge of precautions;Impaired UE functional use;Decreased range of motion   Rehab Potential Excellent   Clinical Impairments Affecting Rehab Potential none   PT Frequency One time visit   PT Treatment/Interventions Therapeutic exercise;Patient/family education   Consulted and Agree with Plan of Care Patient;Family member/caregiver   Family Member Consulted brother     Patient will follow up at outpatient cancer rehab if needed following surgery.  If the patient requires physical therapy at that time, a specific plan will be dictated and sent to the referring physician for approval. The patient was educated today on appropriate basic range of motion exercises to begin post operatively and the importance of attending the After Breast Cancer class following surgery.  Patient was educated today on lymphedema risk reduction practices as it pertains to recommendations that will benefit the patient immediately following surgery.  She verbalized good  understanding.  No additional physical therapy is indicated at this time.       Problem List Patient Active Problem List   Diagnosis Date Noted  . [name] ([place]) 04/05/2015  . Convulsions/seizures ([place]) 03/16/2015  . History of stroke 09/14/2014  . Bipolar 1 disorder ([place])   . Schizo affective schizophrenia ([place])   . Depression, major ([place])   . Stroke ([place])   . Hyperkalemia 05/08/2012  . Headache 05/08/2012  . Hallucination 05/06/2012  . Hyperlipidemia 05/06/2012  . Tobacco abuse 05/06/2012  . Depression 05/06/2012  . Hypertension 05/04/2012  . CVA (cerebral infarction) 05/04/2012  . Schizoaffective disorder, bipolar type [facility]) 05/04/2012    Annia Friendly, PT 04/05/2015 2:44 PM  [place], [place], [address] Phone: [phone]   Fax:  [phone]  Name: April [name] MRN: [id number] Date of Birth: 1966/04/28

## 2015-04-11 ENCOUNTER — Telehealth: Payer: Self-pay | Admitting: *Deleted

## 2015-04-11 NOTE — Telephone Encounter (Signed)
Spoke with patient from [facility] 11/2.  She is doing well, just waiting on surgery date.  Informed her I would check with [facility] for an update.  Encouraged her to call with any needs or concerns.

## 2015-04-20 ENCOUNTER — Other Ambulatory Visit: Payer: Self-pay | Admitting: General Surgery

## 2015-04-20 DIAGNOSIS — C50411 Malignant neoplasm of upper-outer quadrant of right female breast: Secondary | ICD-10-CM

## 2015-04-21 ENCOUNTER — Other Ambulatory Visit: Payer: Self-pay | Admitting: *Deleted

## 2015-04-24 ENCOUNTER — Telehealth: Payer: Self-pay | Admitting: Hematology and Oncology

## 2015-04-24 NOTE — Telephone Encounter (Signed)
sw pt and advised on DEC appt...pt ok and aware °

## 2015-05-08 ENCOUNTER — Encounter (HOSPITAL_BASED_OUTPATIENT_CLINIC_OR_DEPARTMENT_OTHER): Payer: Self-pay | Admitting: *Deleted

## 2015-05-09 ENCOUNTER — Encounter (HOSPITAL_BASED_OUTPATIENT_CLINIC_OR_DEPARTMENT_OTHER)
Admission: RE | Admit: 2015-05-09 | Discharge: 2015-05-09 | Disposition: A | Discharge status: Home / Self Care | Payer: Medicaid Other | Source: Ambulatory Visit | Attending: General Surgery | Admitting: General Surgery

## 2015-05-09 ENCOUNTER — Ambulatory Visit
Admission: RE | Admit: 2015-05-09 | Discharge: 2015-05-09 | Disposition: A | Discharge status: Home / Self Care | Payer: Medicaid Other | Source: Ambulatory Visit | Attending: General Surgery | Admitting: General Surgery

## 2015-05-09 DIAGNOSIS — G40909 Epilepsy, unspecified, not intractable, without status epilepticus: Secondary | ICD-10-CM | POA: Diagnosis not present

## 2015-05-09 DIAGNOSIS — N6091 Unspecified benign mammary dysplasia of right breast: Secondary | ICD-10-CM | POA: Diagnosis not present

## 2015-05-09 DIAGNOSIS — E7800 Pure hypercholesterolemia, unspecified: Secondary | ICD-10-CM | POA: Diagnosis not present

## 2015-05-09 DIAGNOSIS — Z8673 Personal history of transient ischemic attack (TIA), and cerebral infarction without residual deficits: Secondary | ICD-10-CM | POA: Diagnosis not present

## 2015-05-09 DIAGNOSIS — I10 Essential (primary) hypertension: Secondary | ICD-10-CM | POA: Diagnosis not present

## 2015-05-09 DIAGNOSIS — E669 Obesity, unspecified: Secondary | ICD-10-CM | POA: Diagnosis not present

## 2015-05-09 DIAGNOSIS — K219 Gastro-esophageal reflux disease without esophagitis: Secondary | ICD-10-CM | POA: Diagnosis not present

## 2015-05-09 DIAGNOSIS — C50411 Malignant neoplasm of upper-outer quadrant of right female breast: Secondary | ICD-10-CM

## 2015-05-09 DIAGNOSIS — Z79899 Other long term (current) drug therapy: Secondary | ICD-10-CM | POA: Diagnosis not present

## 2015-05-09 DIAGNOSIS — Z170 Estrogen receptor positive status [ER+]: Secondary | ICD-10-CM | POA: Diagnosis not present

## 2015-05-09 DIAGNOSIS — Z6838 Body mass index (BMI) 38.0-38.9, adult: Secondary | ICD-10-CM | POA: Diagnosis not present

## 2015-05-09 DIAGNOSIS — M1990 Unspecified osteoarthritis, unspecified site: Secondary | ICD-10-CM | POA: Diagnosis not present

## 2015-05-09 DIAGNOSIS — F17210 Nicotine dependence, cigarettes, uncomplicated: Secondary | ICD-10-CM | POA: Diagnosis not present

## 2015-05-09 DIAGNOSIS — Z90710 Acquired absence of both cervix and uterus: Secondary | ICD-10-CM | POA: Diagnosis not present

## 2015-05-12 ENCOUNTER — Encounter (HOSPITAL_BASED_OUTPATIENT_CLINIC_OR_DEPARTMENT_OTHER): Payer: Self-pay | Admitting: Certified Registered"

## 2015-05-12 ENCOUNTER — Encounter (HOSPITAL_COMMUNITY)
Admission: RE | Admit: 2015-05-12 | Discharge: 2015-05-12 | Disposition: A | Discharge status: Home / Self Care | Payer: Medicaid Other | Source: Ambulatory Visit | Attending: General Surgery | Admitting: General Surgery

## 2015-05-12 ENCOUNTER — Encounter (HOSPITAL_BASED_OUTPATIENT_CLINIC_OR_DEPARTMENT_OTHER)
Admission: RE | Disposition: A | Discharge status: Home / Self Care | Payer: Self-pay | Source: Ambulatory Visit | Attending: General Surgery

## 2015-05-12 ENCOUNTER — Ambulatory Visit (HOSPITAL_BASED_OUTPATIENT_CLINIC_OR_DEPARTMENT_OTHER): Payer: Medicaid Other | Admitting: Certified Registered"

## 2015-05-12 ENCOUNTER — Ambulatory Visit (HOSPITAL_BASED_OUTPATIENT_CLINIC_OR_DEPARTMENT_OTHER)
Admission: RE | Admit: 2015-05-12 | Discharge: 2015-05-12 | Disposition: A | Discharge status: Home / Self Care | Payer: Medicaid Other | Source: Ambulatory Visit | Attending: General Surgery | Admitting: General Surgery

## 2015-05-12 ENCOUNTER — Ambulatory Visit
Admission: RE | Admit: 2015-05-12 | Discharge: 2015-05-12 | Disposition: A | Discharge status: Home / Self Care | Payer: Medicaid Other | Source: Ambulatory Visit | Attending: General Surgery | Admitting: General Surgery

## 2015-05-12 DIAGNOSIS — Z170 Estrogen receptor positive status [ER+]: Secondary | ICD-10-CM | POA: Diagnosis not present

## 2015-05-12 DIAGNOSIS — F17210 Nicotine dependence, cigarettes, uncomplicated: Secondary | ICD-10-CM | POA: Insufficient documentation

## 2015-05-12 DIAGNOSIS — N6091 Unspecified benign mammary dysplasia of right breast: Secondary | ICD-10-CM | POA: Insufficient documentation

## 2015-05-12 DIAGNOSIS — C50411 Malignant neoplasm of upper-outer quadrant of right female breast: Secondary | ICD-10-CM | POA: Diagnosis not present

## 2015-05-12 DIAGNOSIS — G40909 Epilepsy, unspecified, not intractable, without status epilepticus: Secondary | ICD-10-CM | POA: Insufficient documentation

## 2015-05-12 DIAGNOSIS — E669 Obesity, unspecified: Secondary | ICD-10-CM | POA: Insufficient documentation

## 2015-05-12 DIAGNOSIS — K219 Gastro-esophageal reflux disease without esophagitis: Secondary | ICD-10-CM | POA: Insufficient documentation

## 2015-05-12 DIAGNOSIS — M1990 Unspecified osteoarthritis, unspecified site: Secondary | ICD-10-CM | POA: Insufficient documentation

## 2015-05-12 DIAGNOSIS — I10 Essential (primary) hypertension: Secondary | ICD-10-CM | POA: Insufficient documentation

## 2015-05-12 DIAGNOSIS — Z8673 Personal history of transient ischemic attack (TIA), and cerebral infarction without residual deficits: Secondary | ICD-10-CM | POA: Insufficient documentation

## 2015-05-12 DIAGNOSIS — Z79899 Other long term (current) drug therapy: Secondary | ICD-10-CM | POA: Insufficient documentation

## 2015-05-12 DIAGNOSIS — Z6838 Body mass index (BMI) 38.0-38.9, adult: Secondary | ICD-10-CM | POA: Insufficient documentation

## 2015-05-12 DIAGNOSIS — Z90710 Acquired absence of both cervix and uterus: Secondary | ICD-10-CM | POA: Insufficient documentation

## 2015-05-12 DIAGNOSIS — E7800 Pure hypercholesterolemia, unspecified: Secondary | ICD-10-CM | POA: Insufficient documentation

## 2015-05-12 HISTORY — PX: RADIOACTIVE SEED GUIDED PARTIAL MASTECTOMY WITH AXILLARY SENTINEL LYMPH NODE BIOPSY: SHX6520

## 2015-05-12 SURGERY — RADIOACTIVE SEED GUIDED PARTIAL MASTECTOMY WITH AXILLARY SENTINEL LYMPH NODE BIOPSY
Anesthesia: General | Site: Breast | Laterality: Right

## 2015-05-12 MED ORDER — FENTANYL CITRATE (PF) 100 MCG/2ML IJ SOLN
INTRAMUSCULAR | Status: AC
  Filled 2015-05-12: qty 2

## 2015-05-12 MED ORDER — BUPIVACAINE HCL (PF) 0.25 % IJ SOLN
INTRAMUSCULAR | Status: AC
  Filled 2015-05-12: qty 30

## 2015-05-12 MED ORDER — KETOROLAC TROMETHAMINE 30 MG/ML IJ SOLN: 30.0000 mg | Freq: Once | INTRAMUSCULAR | Status: DC

## 2015-05-12 MED ORDER — LIDOCAINE HCL (CARDIAC) 20 MG/ML IV SOLN
INTRAVENOUS | Status: AC
Start: 2015-05-12 — End: 2015-05-12
  Filled 2015-05-12: qty 5

## 2015-05-12 MED ORDER — MIDAZOLAM HCL 2 MG/2ML IJ SOLN
INTRAMUSCULAR | Status: AC
  Filled 2015-05-12: qty 2

## 2015-05-12 MED ORDER — PROMETHAZINE HCL 25 MG/ML IJ SOLN: 6.2500 mg | INTRAMUSCULAR | Status: DC | PRN

## 2015-05-12 MED ORDER — ONDANSETRON HCL 4 MG/2ML IJ SOLN
INTRAMUSCULAR | Status: AC
  Filled 2015-05-12: qty 2

## 2015-05-12 MED ORDER — MIDAZOLAM HCL 2 MG/2ML IJ SOLN
1.0000 mg | INTRAMUSCULAR | Status: DC | PRN
  Administered 2015-05-12: 2 mg via INTRAVENOUS

## 2015-05-12 MED ORDER — DEXAMETHASONE SODIUM PHOSPHATE 4 MG/ML IJ SOLN
INTRAMUSCULAR | Status: DC | PRN
  Administered 2015-05-12: 10 mg via INTRAVENOUS

## 2015-05-12 MED ORDER — CEFAZOLIN SODIUM-DEXTROSE 2-3 GM-% IV SOLR
2.0000 g | INTRAVENOUS | Status: AC
  Administered 2015-05-12: 2 g via INTRAVENOUS

## 2015-05-12 MED ORDER — FENTANYL CITRATE (PF) 100 MCG/2ML IJ SOLN
25.0000 ug | INTRAMUSCULAR | Status: DC | PRN
  Administered 2015-05-12: 50 ug via INTRAVENOUS

## 2015-05-12 MED ORDER — LIDOCAINE HCL (CARDIAC) 20 MG/ML IV SOLN
INTRAVENOUS | Status: DC | PRN
  Administered 2015-05-12: 60 mg via INTRAVENOUS

## 2015-05-12 MED ORDER — SCOPOLAMINE 1 MG/3DAYS TD PT72: 1.0000 | MEDICATED_PATCH | Freq: Once | TRANSDERMAL | Status: DC

## 2015-05-12 MED ORDER — HYDROMORPHONE HCL 1 MG/ML IJ SOLN
INTRAMUSCULAR | Status: AC
Start: 2015-05-12 — End: 2015-05-12
  Filled 2015-05-12: qty 1

## 2015-05-12 MED ORDER — ONDANSETRON HCL 4 MG/2ML IJ SOLN
INTRAMUSCULAR | Status: DC | PRN
  Administered 2015-05-12: 4 mg via INTRAVENOUS

## 2015-05-12 MED ORDER — DEXAMETHASONE SODIUM PHOSPHATE 10 MG/ML IJ SOLN
INTRAMUSCULAR | Status: AC
  Filled 2015-05-12: qty 1

## 2015-05-12 MED ORDER — SODIUM CHLORIDE 0.9 % IJ SOLN
INTRAMUSCULAR | Status: AC
  Filled 2015-05-12: qty 10

## 2015-05-12 MED ORDER — HYDROMORPHONE HCL 1 MG/ML IJ SOLN
INTRAMUSCULAR | Status: AC
  Filled 2015-05-12: qty 1

## 2015-05-12 MED ORDER — METHYLENE BLUE 1 % INJ SOLN
INTRAMUSCULAR | Status: AC
  Filled 2015-05-12: qty 10

## 2015-05-12 MED ORDER — LACTATED RINGERS IV SOLN
INTRAVENOUS | Status: DC
  Administered 2015-05-12 (×2): via INTRAVENOUS

## 2015-05-12 MED ORDER — PROPOFOL 10 MG/ML IV BOLUS
INTRAVENOUS | Status: DC | PRN
  Administered 2015-05-12: 200 mg via INTRAVENOUS

## 2015-05-12 MED ORDER — BUPIVACAINE HCL (PF) 0.25 % IJ SOLN
INTRAMUSCULAR | Status: DC | PRN
  Administered 2015-05-12: 8 mL

## 2015-05-12 MED ORDER — OXYCODONE-ACETAMINOPHEN 10-325 MG PO TABS: 1.0000 | ORAL_TABLET | Freq: Four times a day (QID) | ORAL | Status: DC | PRN

## 2015-05-12 MED ORDER — GLYCOPYRROLATE 0.2 MG/ML IJ SOLN: 0.2000 mg | Freq: Once | INTRAMUSCULAR | Status: DC | PRN

## 2015-05-12 MED ORDER — CEFAZOLIN SODIUM-DEXTROSE 2-3 GM-% IV SOLR
INTRAVENOUS | Status: AC
  Filled 2015-05-12: qty 50

## 2015-05-12 MED ORDER — HYDROMORPHONE HCL 1 MG/ML IJ SOLN
0.2500 mg | INTRAMUSCULAR | Status: DC | PRN
  Administered 2015-05-12 (×4): 0.5 mg via INTRAVENOUS

## 2015-05-12 MED ORDER — FENTANYL CITRATE (PF) 100 MCG/2ML IJ SOLN
50.0000 ug | INTRAMUSCULAR | Status: DC | PRN
  Administered 2015-05-12: 100 ug via INTRAVENOUS

## 2015-05-12 MED ORDER — TECHNETIUM TC 99M SULFUR COLLOID FILTERED
1.0000 | Freq: Once | INTRAVENOUS | Status: AC | PRN
  Administered 2015-05-12: 1 via INTRADERMAL

## 2015-05-12 SURGICAL SUPPLY — 41 items
APPLIER CLIP 9.375 MED OPEN (MISCELLANEOUS) ×3
APR CLP MED 9.3 20 MLT OPN (MISCELLANEOUS) ×1
BINDER BREAST XLRG (GAUZE/BANDAGES/DRESSINGS) ×3 IMPLANT
BLADE SURG 15 STRL LF DISP TIS (BLADE) ×1 IMPLANT
BLADE SURG 15 STRL SS (BLADE) ×3
CHLORAPREP W/TINT 26ML (MISCELLANEOUS) ×3 IMPLANT
CLIP APPLIE 9.375 MED OPEN (MISCELLANEOUS) ×1 IMPLANT
CLOSURE WOUND 1/2 X4 (GAUZE/BANDAGES/DRESSINGS) ×1
COVER BACK TABLE 60X90IN (DRAPES) ×3 IMPLANT
COVER MAYO STAND STRL (DRAPES) ×3 IMPLANT
COVER PROBE W GEL 5X96 (DRAPES) ×3 IMPLANT
DEVICE DUBIN W/COMP PLATE 8390 (MISCELLANEOUS) ×3 IMPLANT
DRAPE LAPAROSCOPIC ABDOMINAL (DRAPES) ×3 IMPLANT
DRAPE UTILITY XL STRL (DRAPES) ×3 IMPLANT
ELECT COATED BLADE 2.86 ST (ELECTRODE) ×3 IMPLANT
ELECT REM PT RETURN 9FT ADLT (ELECTROSURGICAL) ×3
ELECTRODE REM PT RTRN 9FT ADLT (ELECTROSURGICAL) ×1 IMPLANT
GLOVE BIO SURGEON STRL SZ7 (GLOVE) ×6 IMPLANT
GLOVE BIOGEL PI IND STRL 7.5 (GLOVE) ×1 IMPLANT
GLOVE BIOGEL PI INDICATOR 7.5 (GLOVE) ×2
GOWN STRL REUS W/ TWL LRG LVL3 (GOWN DISPOSABLE) ×2 IMPLANT
GOWN STRL REUS W/TWL LRG LVL3 (GOWN DISPOSABLE) ×6
ILLUMINATOR WAVEGUIDE N/F (MISCELLANEOUS) ×3 IMPLANT
KIT MARKER MARGIN INK (KITS) ×3 IMPLANT
LIQUID BAND (GAUZE/BANDAGES/DRESSINGS) ×3 IMPLANT
NEEDLE HYPO 25X1 1.5 SAFETY (NEEDLE) ×3 IMPLANT
NS IRRIG 1000ML POUR BTL (IV SOLUTION) ×3 IMPLANT
PACK BASIN DAY SURGERY FS (CUSTOM PROCEDURE TRAY) ×3 IMPLANT
PENCIL BUTTON HOLSTER BLD 10FT (ELECTRODE) ×3 IMPLANT
SLEEVE SCD COMPRESS KNEE MED (MISCELLANEOUS) ×3 IMPLANT
SPONGE LAP 4X18 X RAY DECT (DISPOSABLE) ×3 IMPLANT
STRIP CLOSURE SKIN 1/2X4 (GAUZE/BANDAGES/DRESSINGS) ×2 IMPLANT
SUT MNCRL AB 4-0 PS2 18 (SUTURE) ×3 IMPLANT
SUT SILK 2 0 SH (SUTURE) ×3 IMPLANT
SUT VIC AB 2-0 SH 27 (SUTURE) ×3
SUT VIC AB 2-0 SH 27XBRD (SUTURE) ×1 IMPLANT
SUT VIC AB 3-0 SH 27 (SUTURE) ×3
SUT VIC AB 3-0 SH 27X BRD (SUTURE) ×1 IMPLANT
SYR CONTROL 10ML LL (SYRINGE) ×3 IMPLANT
TOWEL OR 17X24 6PK STRL BLUE (TOWEL DISPOSABLE) ×3 IMPLANT
TOWEL OR NON WOVEN STRL DISP B (DISPOSABLE) ×3 IMPLANT

## 2015-05-12 NOTE — Transfer of Care (Signed)
Immediate Anesthesia Transfer of Care Note  Patient: April [name]  Procedure(s) Performed: Procedure(s): RADIOACTIVE SEED GUIDED PARTIAL MASTECTOMY WITH AXILLARY SENTINEL LYMPH NODE BIOPSY (Right)  Patient Location: PACU  Anesthesia Type:General  Level of Consciousness: awake, sedated and patient cooperative  Airway & Oxygen Therapy: Patient Spontanous Breathing  Post-op Assessment: Report given to RN and Post -op Vital signs reviewed and stable  Post vital signs: Reviewed and stable  Last Vitals:  Filed Vitals:   05/12/15 1045 05/12/15 1049  BP: 116/76 111/72  Pulse: 68 67  Temp:    Resp: 13 20    Complications: No apparent anesthesia complications

## 2015-05-12 NOTE — Progress Notes (Signed)
Radiology staff performed nuc med inj. No additional sedation required. Pt tol well. Will bring family to bedside and update/provide emotional support

## 2015-05-12 NOTE — H&P (Signed)
49 yof with multiple medical issues presents after undergoing a screening mammogram that showed a right breast mass. she has history of benign left breast biopsy. she has no family history. she does not note a mass or any discharge. this is uoq small mass and on [place] is a 5 mm mass. this underwent a core biopsy that is a grade I IDC that is er/pr positive, her 2 negative and Ki is 5%.    Other Problems  Anxiety Disorder Arthritis Bladder Problems Breast Cancer Cerebrovascular Accident Cholelithiasis Depression Gastroesophageal Reflux Disease High blood pressure Hypercholesterolemia Lump In Breast Oophorectomy Bilateral. Other disease, cancer, significant illness Seizure Disorder  Past Surgical History  Breast Biopsy Bilateral. Colon Polyp Removal - Colonoscopy Gallbladder Surgery - Laparoscopic Hysterectomy (not due to cancer) - Complete Oral Surgery  Diagnostic Studies History Colonoscopy 5-10 years ago Mammogram within last year Pap Smear 1-5 years ago  Medication History No Current Medications Medications Reconciled  Social History  Alcohol use Occasional alcohol use. Caffeine use Carbonated beverages, Coffee, Tea. No drug use Tobacco use Current every day smoker.  Family History  Alcohol Abuse Brother, Family Members In [employer], Father. Arthritis Family Members In General. Bleeding disorder Sister. Colon Polyps Brother, Sister. Depression Family Members In General. Heart Disease Family Members In General. Hypertension Father, Mother. Migraine Headache Sister. Respiratory Condition Family Members In General, Father, Mother.  Pregnancy / Birth History  Age at menarche 22 years. Age of menopause <44 Contraceptive History Oral contraceptives. Gravida 3 Irregular periods Maternal age 67-20 Para 3  Review of Systems General Present- Weight Loss. Not Present- Appetite Loss, Chills, Fatigue, Fever, Night  Sweats and Weight Gain. Skin Not Present- Change in Wart/Mole, Dryness, Hives, Jaundice, New Lesions, Non-Healing Wounds, Rash and Ulcer. HEENT Present- Wears glasses/contact lenses. Not Present- Earache, Hearing Loss, Hoarseness, Nose Bleed, Oral Ulcers, Ringing in the Ears, Seasonal Allergies, Sinus Pain, Sore Throat, Visual Disturbances and Yellow Eyes. Respiratory Not Present- Bloody sputum, Chronic Cough, Difficulty Breathing, Snoring and Wheezing. Breast Not Present- Breast Mass, Breast Pain, Nipple Discharge and Skin Changes. Cardiovascular Not Present- Chest Pain, Difficulty Breathing Lying Down, Leg Cramps, Palpitations, Rapid Heart Rate, Shortness of Breath and Swelling of Extremities. Gastrointestinal Not Present- Abdominal Pain, Bloating, Bloody Stool, Change in Bowel Habits, Chronic diarrhea, Constipation, Difficulty Swallowing, Excessive gas, Gets full quickly at meals, Hemorrhoids, Indigestion, Nausea, Rectal Pain and Vomiting. Female Genitourinary Present- Frequency, Nocturia and Urgency. Not Present- Painful Urination and Pelvic Pain. Musculoskeletal Present- Joint Pain and Joint Stiffness. Not Present- Back Pain, Muscle Pain, Muscle Weakness and Swelling of Extremities. Neurological Present- Seizures. Not Present- Decreased Memory, Fainting, Headaches, Numbness, Tingling, Tremor, Trouble walking and Weakness. Psychiatric Present- Anxiety, Bipolar, Change in Sleep Pattern, Depression and Fearful. Not Present- Frequent crying. Endocrine Present- Hair Changes and Hot flashes. Not Present- Cold Intolerance, Excessive Hunger, Heat Intolerance and New Diabetes.   Physical Exam  General Mental Status-Alert. Orientation-Oriented X3. Chest and Lung Exam Chest and lung exam reveals -on auscultation, normal breath sounds, no adventitious sounds and normal vocal resonance. Breast Nipples-No Discharge. Breast Lump-No Palpable Breast Mass. Cardiovascular Cardiovascular  examination reveals -normal heart sounds, regular rate and rhythm with no murmurs. Lymphatic Head & Neck General Head & Neck Lymphatics: Bilateral - Description - Normal. Axillary General Axillary Region: Bilateral - Description - Normal. Note: no Hollister adenopathy  Assessment & Plan  [name] (C50.411) Story: Right breast seed guided lumpectomy, right axillary sentinel node biopsy We discussed the  staging and pathophysiology of breast cancer. We discussed all of the different options for treatment for breast cancer including surgery, chemotherapy, radiation therapy, Herceptin, and antiestrogen therapy. We discussed a sentinel lymph node biopsy as she does not appear to having lymph node involvement right now. We discussed the performance of that with injection of radioactive tracer and possibly blue dye. We discussed that she would have an incision underneath her axillary hairline or would be done through the same incision. We discussed that there is a chance of having a positive node with a sentinel lymph node biopsy and we will await the permanent pathology to make any other first further decisions in terms of her treatment. One of these options might be to return to the operating room to perform an axillary lymph node dissection. We discussed up to a 5% risk lifetime of chronic shoulder pain as well as lymphedema associated with a sentinel lymph node biopsy. We discussed the options for treatment of the breast cancer which included lumpectomy versus a mastectomy. We discussed the performance of the lumpectomy with radioactive seed placement. We discussed a 5-10% chance of a positive margin requiring reexcision in the operating room. We also discussed that she will likely need radiation therapy (this is usually 5-7 weeks) if she undergoes lumpectomy. We discussed the mastectomy (removal of whole breast) and the postoperative care for that as well.  Mastectomy can be followed by reconstruction. This is a more extensive surgery and requires more recovery. The decision for lumpectomy vs mastectomy has no impact on decision for chemotherapy. Most mastectomy patients will not need radiation therapy. We discussed that there is no difference in her survival. will check with Dr Jannifer Franklin prior to surgery

## 2015-05-12 NOTE — Discharge Instructions (Signed)
[facility] °Office Phone Number [phone] ° °POST OP INSTRUCTIONS ° °Always review your discharge instruction sheet given to you by the facility where your surgery was performed. ° °IF YOU HAVE DISABILITY OR FAMILY LEAVE FORMS, YOU MUST BRING THEM TO THE OFFICE FOR PROCESSING.  DO NOT GIVE THEM TO YOUR DOCTOR. ° °1. A prescription for pain medication may be given to you upon discharge.  Take your pain medication as prescribed, if needed.  If narcotic pain medicine is not needed, then you may take acetaminophen (Tylenol), naprosyn (Alleve) or ibuprofen (Advil) as needed. °2. Take your usually prescribed medications unless otherwise directed °3. If you need a refill on your pain medication, please contact your pharmacy.  They will contact our office to request authorization.  Prescriptions will not be filled after 5pm or on week-ends. °4. You should eat very light the first 24 hours after surgery, such as soup, crackers, pudding, etc.  Resume your normal diet the day after surgery. °5. Most patients will experience some swelling and bruising in the breast.  Ice packs and a good support bra will help.  Wear the breast binder provided or a sports bra for 72 hours day and night.  After that wear a sports bra during the day until you return to the office. Swelling and bruising can take several days to resolve.  °6. It is common to experience some constipation if taking pain medication after surgery.  Increasing fluid intake and taking a stool softener will usually help or prevent this problem from occurring.  A mild laxative (Milk of Magnesia or Miralax) should be taken according to package directions if there are no bowel movements after 48 hours. °7. Unless discharge instructions indicate otherwise, you may remove your bandages 48 hours after surgery and you may shower at that time.  You may have steri-strips (small skin tapes) in place directly over the incision.  These strips should be left on the  skin for 7-10 days and will come off on their own.  If your surgeon used skin glue on the incision, you may shower in 24 hours.  The glue will flake off over the next 2-3 weeks.  Any sutures or staples will be removed at the office during your follow-up visit. °8. ACTIVITIES:  You may resume regular daily activities (gradually increasing) beginning the next day.  Wearing a good support bra or sports bra minimizes pain and swelling.  You may have sexual intercourse when it is comfortable. °a. You may drive when you no longer are taking prescription pain medication, you can comfortably wear a seatbelt, and you can safely maneuver your car and apply brakes. °b. RETURN TO WORK:  ______________________________________________________________________________________ °9. You should see your doctor in the office for a follow-up appointment approximately two weeks after your surgery.  Your doctor’s nurse will typically make your follow-up appointment when she calls you with your pathology report.  Expect your pathology report 3-4 business days after your surgery.  You may call to check if you do not hear from us after three days. °10. OTHER INSTRUCTIONS: _______________________________________________________________________________________________ _____________________________________________________________________________________________________________________________________ °_____________________________________________________________________________________________________________________________________ °_____________________________________________________________________________________________________________________________________ ° °WHEN TO CALL DR WAKEFIELD: °1. Fever over 101.0 °2. Nausea and/or vomiting. °3. Extreme swelling or bruising. °4. Continued bleeding from incision. °5. Increased pain, redness, or drainage from the incision. ° °The clinic staff is available to answer your questions during regular  business hours.  Please don’t hesitate to call and ask to speak to one of the nurses for clinical concerns.  If   you have a medical emergency, go to the nearest emergency room or call 911.  A surgeon from [facility] is always on call at the hospital. ° °For further questions, please visit [email or web address] ° ° ° °Post Anesthesia Home Care Instructions ° °Activity: °Get plenty of rest for the remainder of the day. A responsible adult should stay with you for 24 hours following the procedure.  °For the next 24 hours, DO NOT: °-Drive a car °-Operate machinery °-Drink alcoholic beverages °-Take any medication unless instructed by your physician °-Make any legal decisions or sign important papers. ° °Meals: °Start with liquid foods such as gelatin or soup. Progress to regular foods as tolerated. Avoid greasy, spicy, heavy foods. If nausea and/or vomiting occur, drink only clear liquids until the nausea and/or vomiting subsides. Call your physician if vomiting continues. ° °Special Instructions/Symptoms: °Your throat may feel dry or sore from the anesthesia or the breathing tube placed in your throat during surgery. If this causes discomfort, gargle with warm salt water. The discomfort should disappear within 24 hours. ° °If you had a scopolamine patch placed behind your ear for the management of post- operative nausea and/or vomiting: ° °1. The medication in the patch is effective for 72 hours, after which it should be removed.  Wrap patch in a tissue and discard in the trash. Wash hands thoroughly with soap and water. °2. You may remove the patch earlier than 72 hours if you experience unpleasant side effects which may include dry mouth, dizziness or visual disturbances. °3. Avoid touching the patch. Wash your hands with soap and water after contact with the patch. °  ° °

## 2015-05-12 NOTE — Interval H&P Note (Signed)
History and Physical Interval Note:  05/12/2015 11:38 AM  April [name]  has presented today for surgery, with the diagnosis of RIGHT BREAST CANCER  The various methods of treatment have been discussed with the patient and family. After consideration of risks, benefits and other options for treatment, the patient has consented to  Procedure(s): RADIOACTIVE SEED GUIDED PARTIAL MASTECTOMY WITH AXILLARY SENTINEL LYMPH NODE BIOPSY (Right) as a surgical intervention .  The patient's history has been reviewed, patient examined, no change in status, stable for surgery.  I have reviewed the patient's chart and labs.  Questions were answered to the patient's satisfaction.     Roise Emert

## 2015-05-12 NOTE — Anesthesia Postprocedure Evaluation (Signed)
Anesthesia Post Note  Patient: April [name]  Procedure(s) Performed: Procedure(s) (LRB): RADIOACTIVE SEED GUIDED PARTIAL MASTECTOMY WITH AXILLARY SENTINEL LYMPH NODE BIOPSY (Right)  Patient location during evaluation: PACU Anesthesia Type: General and Regional Level of consciousness: awake and alert Pain management: pain level controlled Vital Signs Assessment: post-procedure vital signs reviewed and stable Respiratory status: spontaneous breathing and respiratory function stable Cardiovascular status: blood pressure returned to baseline and stable Postop Assessment: no signs of nausea or vomiting Anesthetic complications: no    Last Vitals:  Filed Vitals:   05/12/15 1315 05/12/15 1330  BP: 121/78 126/77  Pulse: 63 71  Temp:    Resp: 14 14    Last Pain: There were no vitals filed for this visit.               [name] S

## 2015-05-12 NOTE — Progress Notes (Signed)
Assisted Dr. Rose with right, ultrasound guided, pectoralis block. Side rails up, monitors on throughout procedure. See vital signs in flow sheet. Tolerated Procedure well. 

## 2015-05-12 NOTE — Anesthesia Procedure Notes (Signed)
Procedure Name: LMA Insertion Date/Time: 05/12/2015 11:52 AM Performed by: Lyndee Leo Pre-anesthesia Checklist: Patient identified, Emergency Drugs available, Suction available and Patient being monitored Patient Re-evaluated:Patient Re-evaluated prior to inductionOxygen Delivery Method: Circle System Utilized Preoxygenation: Pre-oxygenation with 100% oxygen Intubation Type: IV induction Ventilation: Mask ventilation without difficulty LMA: LMA inserted LMA Size: 4.0 Number of attempts: 1 Airway Equipment and Method: Bite block Placement Confirmation: positive ETCO2 and breath sounds checked- equal and bilateral Tube secured with: Tape Dental Injury: Teeth and Oropharynx as per pre-operative assessment

## 2015-05-12 NOTE — Anesthesia Preprocedure Evaluation (Signed)
Anesthesia Evaluation  Patient identified by MRN, date of birth, ID band Patient awake    Reviewed: Allergy & Precautions, NPO status , Patient's Chart, lab work & pertinent test results  Airway Mallampati: II  TM Distance: >3 FB Neck ROM: Full    Dental no notable dental hx.    Pulmonary Current Smoker,    Pulmonary exam normal breath sounds clear to auscultation       Cardiovascular hypertension, Pt. on medications and Pt. on home beta blockers Normal cardiovascular exam Rhythm:Regular Rate:Normal     Neuro/Psych Seizures -,  CVA negative psych ROS   GI/Hepatic negative GI ROS, Neg liver ROS,   Endo/Other  obesity  Renal/GU negative Renal ROS  negative genitourinary   Musculoskeletal negative musculoskeletal ROS (+)   Abdominal   Peds negative pediatric ROS (+)  Hematology negative hematology ROS (+)   Anesthesia Other Findings   Reproductive/Obstetrics negative OB ROS                             Anesthesia Physical Anesthesia Plan  ASA: III  Anesthesia Plan: General   Post-op Pain Management: GA combined w/ Regional for post-op pain   Induction: Intravenous  Airway Management Planned: LMA  Additional Equipment:   Intra-op Plan:   Post-operative Plan: Extubation in OR  Informed Consent: I have reviewed the patients History and Physical, chart, labs and discussed the procedure including the risks, benefits and alternatives for the proposed anesthesia with the patient or authorized representative who has indicated his/her understanding and acceptance.   Dental advisory given  Plan Discussed with: CRNA and Surgeon  Anesthesia Plan Comments:         Anesthesia Quick Evaluation

## 2015-05-12 NOTE — Op Note (Signed)
Preoperative diagnosis: stage I right breast cancer Postoperative diagnosis: same as above Procedure: 1. Right breast seed guided lumpectomy 2. Right axillary sn biopsy Surgeon Dr Serita Grammes Anes general with pectoral block EBL: minimal Comps none Specimen:  1. Right breast marked with paint 2. Sentinel node times one, count of 2890 3. Additional superior margin marked short superior,long lateral double deep Sponge count correct at completion dispo to recovery stable  Indications: this is a 11 yof who has clinical stage I left breast cancer. We discussed proceeding with lumpectomy/sn biopsy. She had radioactive seed placed prior to beginning. I had these mm in the OR.   Procedure: After informed consent was obtained the patient was taken to the OR. She was injected with technetium in the standard periareolar fashion. She underwent a pectoral block. She was given ancef. SCDs were in place. She was prepped and draped in the standard sterile surgical fashion. A timeout was performed. I infiltrated marcaine in the skin. I made an axillary incision and did both surgeries through this incision.  I used the lighted retractors to do this. I then removed the seed with an attempt to get a clear margin.  I did confirm removal of both the seed and the clip with mammography. I was close to my superior margin and removed this. This is marked as above. I then obtained hemostasis. I then located the sentinel nodes. I made an incision below the axillary hairline. I went through the axillary fascia. I was able to locate what appeared to be single sentinel node with count listed above. One of these appeared blue.  There were no more palpable or radioactive nodes present. I obtained hemostasis. I then closed the fascia with 2-0 vicryl The skin was closed with 3-0 vicryl and 4-0 monocryl. Glue and steristrips were applied. A breast binder was placed. She was extubated and transferred to recovery  stable

## 2015-05-15 ENCOUNTER — Encounter (HOSPITAL_BASED_OUTPATIENT_CLINIC_OR_DEPARTMENT_OTHER): Payer: Self-pay | Admitting: General Surgery

## 2015-05-15 NOTE — Addendum Note (Signed)
Addendum  created 05/15/15 1317 by Ernesta Amble Alaia Lordi, CRNA   Modules edited: Charges VN

## 2015-05-19 ENCOUNTER — Ambulatory Visit (HOSPITAL_BASED_OUTPATIENT_CLINIC_OR_DEPARTMENT_OTHER): Payer: Medicaid Other | Admitting: Hematology and Oncology

## 2015-05-19 ENCOUNTER — Encounter: Payer: Self-pay | Admitting: Hematology and Oncology

## 2015-05-19 ENCOUNTER — Encounter: Payer: Self-pay | Admitting: *Deleted

## 2015-05-19 VITALS — BP 115/74 | HR 91 | Temp 97.9°F | Resp 18 | Ht 63.0 in | Wt 216.9 lb

## 2015-05-19 DIAGNOSIS — C50411 Malignant neoplasm of upper-outer quadrant of right female breast: Secondary | ICD-10-CM

## 2015-05-19 NOTE — Addendum Note (Signed)
Addended by: Prentiss Bells on: 05/19/2015 01:09 PM   Modules accepted: Medications

## 2015-05-19 NOTE — Assessment & Plan Note (Signed)
Right lumpectomy 05/12/2015: Invasive ductal carcinoma grade 1, 0.9 cm, ADH, margins negative, 0/1 lymph nodes, T1BN0 stage IA pathologic stage, ER 100%, PR 90%, HER-2 negative ratio 1.1, Ki-67 5%  Pathology counseling: I discussed the final pathology report and provided a copy to the patient.I reviewed with the patient the final tumor size was slightly bigger than what we assumed on the mammogram. I discussed the significance of margins. She had negative margins so she does not need additional surgery.  Recommendations: 1. Oncotype DX testing to determine if chemotherapy is necessary. 2. Adjuvant radiation therapy followed by 3. Adjuvant antiestrogen therapy  Return to clinic based on Oncotype DX result. We will call the patient with the results.

## 2015-05-19 NOTE — Progress Notes (Unsigned)
Ordered oncotype per Dr. Gudena.  Faxed requisition to pathology and confirmed receipt. 

## 2015-05-19 NOTE — Progress Notes (Signed)
Patient Care Team: Nolene Ebbs, MD as PCP - General (Internal Medicine) Rolm Bookbinder, MD as Consulting Physician (General Surgery) Nicholas Lose, MD as Consulting Physician (Hematology and Oncology) Arloa Koh, MD as Consulting Physician (Radiation Oncology)  DIAGNOSIS: [name] [facility])   Staging form: Breast, AJCC 7th Edition     Clinical stage from 04/05/2015: Stage IA (T1a, N0, M0) - Unsigned       Staging comments: Staged at breast conference 11.2.16  SUMMARY OF ONCOLOGIC HISTORY:   [name] ([place])   03/20/2015 Mammogram Right breast mass upper outer quadrant 4-5 mm focus, T1a N0 stage IA clinical stage, ultrasound axilla normal-appearing lymph nodes   04/05/2015 Initial Diagnosis Right breast biopsy 10:00 position: 8 cm from the nipple, invasive ductal carcinoma grade 1, ER/PR positive, HER-2 negative, ratio 1.14, Ki-67 5%   05/12/2015 Surgery Right lumpectomy: Invasive ductal carcinoma grade 1, 0.9 cm, ADH, margins negative, 0/1 lymph nodes, Stephen B N0 stage I a pathologic stage, ER 100%, PR 90%, HER-2 negative ratio 1.1, Ki-67 5%   CHIEF COMPLIANT: Follow-up after recent lumpectomy  INTERVAL HISTORY: April [name] is a 49 year old with above-mentioned history of right breast cancer underwent lumpectomy and is here today to discuss the pathology report. She had done extremely well from surgery standpoint. She reports no major problems or pain or discomfort.  REVIEW OF SYSTEMS:   Constitutional: Denies fevers, chills or abnormal weight loss Eyes: Denies blurriness of vision Ears, nose, mouth, throat, and face: Denies mucositis or sore throat Respiratory: Denies cough, dyspnea or wheezes Cardiovascular: Denies palpitation, chest discomfort or lower extremity swelling Gastrointestinal:  Denies nausea, heartburn or change in bowel habits Skin: Denies abnormal skin  rashes Lymphatics: Denies new lymphadenopathy or easy bruising Neurological:Denies numbness, tingling or new weaknesses Behavioral/Psych: Mood is stable, no new changes  Breast: Recent surgery and right breast All other systems were reviewed with the patient and are negative.  I have reviewed the past medical history, past surgical history, social history and family history with the patient and they are unchanged from previous note.  ALLERGIES:  is allergic to morphine and related and hydrocodone.  MEDICATIONS:  Current Outpatient Prescriptions  Medication Sig Dispense Refill  . ARIPiprazole (ABILIFY) 20 MG tablet Take 20 mg by mouth daily.    Marland Kitchen aspirin 81 MG chewable tablet Chew 1 tablet (81 mg total) by mouth daily.    Marland Kitchen atorvastatin (LIPITOR) 20 MG tablet Take 1 tablet (20 mg total) by mouth daily at 6 PM. 30 tablet 2  . baclofen (LIORESAL) 20 MG tablet Take 1 tablet (20 mg total) by mouth 2 (two) times daily.    . citalopram (CELEXA) 20 MG tablet Take 30 mg by mouth daily.    Marland Kitchen gabapentin (NEURONTIN) 300 MG capsule Take 300 mg by mouth 3 (three) times daily.    Marland Kitchen LORazepam (ATIVAN) 1 MG tablet Take 1 mg by mouth every 8 (eight) hours.    . metoprolol succinate (TOPROL-XL) 50 MG 24 hr tablet Take 50 mg by mouth daily. Take with or immediately following a meal.    . oxyCODONE-acetaminophen (PERCOCET) 10-325 MG tablet Take 1 tablet by mouth every 6 (six) hours as needed for pain. 20 tablet 0  . QUEtiapine (SEROQUEL XR) 150 MG 24 hr tablet Take 1 tablet (150 mg total) by mouth daily at 8 pm. (Patient taking differently: Take 300 mg by mouth daily at 8 pm. )  30 each 2  . solifenacin (VESICARE) 5 MG tablet Take 5 mg by mouth daily.    Marland Kitchen topiramate (TOPAMAX) 100 MG tablet Take 100 mg by mouth 2 (two) times daily.     No current facility-administered medications for this visit.    PHYSICAL EXAMINATION: ECOG PERFORMANCE STATUS: 1 - Symptomatic but completely ambulatory  Filed Vitals:    05/19/15 1034  BP: 115/74  Pulse: 91  Temp: 97.9 F (36.6 C)  Resp: 18   Filed Weights   05/19/15 1034  Weight: 216 lb 14.4 oz (98.385 kg)    GENERAL:alert, no distress and comfortable SKIN: skin color, texture, turgor are normal, no rashes or significant lesions EYES: normal, Conjunctiva are pink and non-injected, sclera clear OROPHARYNX:no exudate, no erythema and lips, buccal mucosa, and tongue normal  NECK: supple, thyroid normal size, non-tender, without nodularity LYMPH:  no palpable lymphadenopathy in the cervical, axillary or inguinal LUNGS: clear to auscultation and percussion with normal breathing effort HEART: regular rate & rhythm and no murmurs and no lower extremity edema ABDOMEN:abdomen soft, non-tender and normal bowel sounds Musculoskeletal:no cyanosis of digits and no clubbing  NEURO: alert & oriented x 3 with fluent speech, no focal motor/sensory deficits LABORATORY DATA:  I have reviewed the data as listed   Chemistry      Component Value Date/Time   NA 140 04/05/2015 1223   NA 136 05/11/2013 2045   K 4.1 04/05/2015 1223   K 3.6 05/11/2013 2045   CL 97 05/11/2013 2045   CO2 22 04/05/2015 1223   CO2 24 05/11/2013 2045   BUN 10.1 04/05/2015 1223   BUN 9 05/11/2013 2045   CREATININE 0.9 04/05/2015 1223   CREATININE 1.20* 05/11/2013 2045      Component Value Date/Time   CALCIUM 9.0 04/05/2015 1223   CALCIUM 9.7 05/11/2013 2045   ALKPHOS 165* 04/05/2015 1223   ALKPHOS 173* 02/18/2013 0236   AST 34 04/05/2015 1223   AST 27 02/18/2013 0236   ALT 39 04/05/2015 1223   ALT 50* 02/18/2013 0236   BILITOT <0.30 04/05/2015 1223   BILITOT 0.3 02/18/2013 0236       Lab Results  Component Value Date   WBC 9.3 04/05/2015   HGB 13.4 04/05/2015   HCT 40.2 04/05/2015   MCV 90.7 04/05/2015   PLT 229 04/05/2015   NEUTROABS 4.6 04/05/2015   ASSESSMENT & PLAN:  [name] ([place]) Right lumpectomy 05/12/2015:  Invasive ductal carcinoma grade 1, 0.9 cm, ADH, margins negative, 0/1 lymph nodes, T1BN0 stage IA pathologic stage, ER 100%, PR 90%, HER-2 negative ratio 1.1, Ki-67 5%  Pathology counseling: I discussed the final pathology report and provided a copy to the patient.I reviewed with the patient the final tumor size was slightly bigger than what we assumed on the mammogram. I discussed the significance of margins. She had negative margins so she does not need additional surgery.  Recommendations: 1. Oncotype DX testing to determine if chemotherapy is necessary. 2. Adjuvant radiation therapy followed by 3. Adjuvant antiestrogen therapy  Return to clinic based on Oncotype DX result. We will call the patient with the results.    No orders of the defined types were placed in this encounter.   The patient has a good understanding of the overall plan. she agrees with it. she will call with any problems that may develop before the next visit here.   Rulon Eisenmenger, MD 05/19/2015

## 2015-05-25 ENCOUNTER — Ambulatory Visit: Payer: Medicaid Other | Admitting: Radiation Oncology

## 2015-05-25 ENCOUNTER — Ambulatory Visit: Payer: Medicaid Other

## 2015-05-31 ENCOUNTER — Encounter (HOSPITAL_COMMUNITY): Payer: Self-pay

## 2015-06-06 ENCOUNTER — Encounter: Payer: Self-pay | Admitting: Radiation Oncology

## 2015-06-07 ENCOUNTER — Ambulatory Visit
Admission: RE | Admit: 2015-06-07 | Discharge: 2015-06-07 | Disposition: A | Discharge status: Home / Self Care | Payer: Medicaid Other | Source: Ambulatory Visit | Attending: Radiation Oncology | Admitting: Radiation Oncology

## 2015-06-07 ENCOUNTER — Encounter: Payer: Self-pay | Admitting: Radiation Oncology

## 2015-06-07 VITALS — BP 127/81 | HR 98 | Temp 98.1°F | Ht 63.0 in | Wt 217.1 lb

## 2015-06-07 DIAGNOSIS — C50411 Malignant neoplasm of upper-outer quadrant of right female breast: Secondary | ICD-10-CM

## 2015-06-07 DIAGNOSIS — Z510 Encounter for antineoplastic radiation therapy: Secondary | ICD-10-CM | POA: Diagnosis present

## 2015-06-07 DIAGNOSIS — Z170 Estrogen receptor positive status [ER+]: Secondary | ICD-10-CM | POA: Diagnosis not present

## 2015-06-07 NOTE — Progress Notes (Signed)
   Department of Radiation Oncology  Phone:  [phone] Fax:        [phone]   Name: April [name] MRN: [id number]  DOB: 1966/01/17  Date: 06/07/2015  Follow Up Visit Note  Diagnosis: [name] [facility])   Staging form: Breast, AJCC 7th Edition     Clinical stage from 04/05/2015: Stage IA (T1a, N0, M0) - Unsigned       Staging comments: Staged at breast conference 11.2.16      Pathologic stage from 05/16/2015: Stage IA (T1b, N0, cM0) - Unsigned       Staging comments: Staged on surgical specimen by Dr.Kish  -T1BN0 stage IA Invasive Ductal Carcinoma of the Right Breast   Interval History: April [name] presents today for routine followup.  The patient has successfully underwent right breast lumpectomy on 05/12/15 with Dr. Donne Hazel. Pathology indicated a grade I invasive ductal carcinoma, spanning 0.9 cm. The surgical margins were negative. Receptor Status: ER(100%), PR (90%), Her2-neu (Neg), Ki-67(5%). An Oncotype test was performed. Dr. Lindi Adie has determined chemotherapy will not be needed. She will be taking anti-estrogen therapy.    [name] ([place])   03/20/2015 Mammogram Right breast mass upper outer quadrant 4-5 mm focus, T1a N0 stage IA clinical stage, ultrasound axilla normal-appearing lymph nodes   04/05/2015 Initial Diagnosis Right breast biopsy 10:00 position: 8 cm from the nipple, invasive ductal carcinoma grade 1, ER/PR positive, HER-2 negative, ratio 1.14, Ki-67 5%   05/12/2015 Surgery Right lumpectomy: Invasive ductal carcinoma grade 1, 0.9 cm, ADH, margins negative, 0/1 lymph nodes, Stephen B N0 stage I a pathologic stage, ER 100%, PR 90%, HER-2 negative ratio 1.1, Ki-67 5%    Physical Exam:  Filed Vitals:   06/07/15 1519  BP: 127/81  Pulse: 98  Temp: 98.1 F (36.7 C)  TempSrc: Oral  Height: '5\' 3"'$  (1.6 m)  Weight: 217 lb 1.6 oz (98.476 kg)  SpO2: 98%   Well healed single  incision in UOQ of right breast. Good range of motion in bilateral arms. Alert and Oriented x3  IMPRESSION: April [name] is a 50 y.o. female with T1BN0 stage IA Invasive Ductal Carcinoma of the Right Breast   PLAN:  I spoke to the patient today regarding her diagnosis and options for treatment. We discussed the equivalence in terms of survival and local failure between mastectomy and breast conservation. We discussed the role of radiation in decreasing local failures in patients who undergo lumpectomy. We discussed the process of simulation and the placement tattoos. We discussed 4-6 weeks of treatment as an outpatient. We discussed the possibility of asymptomatic lung damage. We discussed the low likelihood of secondary malignancies. We discussed the possible side effects including but not limited to skin redness, fatigue, permanent skin darkening, and breast swelling. Consent was signed.   Simulation scheduled for January 19th at 9 am.   ------------------------------------------------  Thea Silversmith, MD  This document serves as a record of services personally performed by Thea Silversmith, MD. It was created on her behalf by Derek Mound, a trained medical scribe. The creation of this record is based on the scribe's personal observations and the provider's statements to them. This document has been checked and approved by the attending provider.

## 2015-06-22 ENCOUNTER — Ambulatory Visit
Admission: RE | Admit: 2015-06-22 | Discharge: 2015-06-22 | Disposition: A | Discharge status: Home / Self Care | Payer: Medicaid Other | Source: Ambulatory Visit | Attending: Radiation Oncology | Admitting: Radiation Oncology

## 2015-06-22 DIAGNOSIS — Z510 Encounter for antineoplastic radiation therapy: Secondary | ICD-10-CM | POA: Diagnosis not present

## 2015-06-22 DIAGNOSIS — C50411 Malignant neoplasm of upper-outer quadrant of right female breast: Secondary | ICD-10-CM

## 2015-06-22 NOTE — Progress Notes (Signed)
Radiation Oncology         [phone] ________________________________  Name: April [name]      MRN: [id number]          Date: 06/22/2015              DOB: 1966-02-13  Optical Surface Tracking Plan:  Since intensity modulated radiotherapy (IMRT) and 3D conformal radiation treatment methods are predicated on accurate and precise positioning for treatment, intrafraction motion monitoring is medically necessary to ensure accurate and safe treatment delivery.  The ability to quantify intrafraction motion without excessive ionizing radiation dose can only be performed with optical surface tracking. Accordingly, surface imaging offers the opportunity to obtain 3D measurements of patient position throughout IMRT and 3D treatments without excessive radiation exposure.  I am ordering optical surface tracking for this patient's upcoming course of radiotherapy. ________________________________ Signature   Reference:   Ursula Alert, J, et al. Surface imaging-based analysis of intrafraction motion for breast radiotherapy patients.Journal of [place], n. 6, nov. 2014. ISSN [id number].   Available at: <http://www.jacmp.org/index.php/jacmp/article/view/4957>.

## 2015-06-22 NOTE — Progress Notes (Signed)
Name: Shanicka Kaltz   MRN: [id number]  Date:  06/22/2015  DOB: 1965/06/22  Status:outpatient    DIAGNOSIS: Breast cancer.   CONSENT VERIFIED: yes   SET UP: Patient is setup supine   IMMOBILIZATION:  The following immobilization was used:Custom Moldable Pillow, breast board.   NARRATIVE: Ms. Alpaugh was brought to the [facility].  Identity was confirmed.  All relevant records and images related to the planned course of therapy were reviewed.  Then, the patient was positioned in a stable reproducible clinical set-up for radiation therapy.  Wires were placed to delineate the clinical extent of breast tissue. A wire was placed on the scar as well.  CT images were obtained.  An isocenter was placed. Skin markings were placed.  The CT images were loaded into the planning software where the target and avoidance structures were contoured.  The radiation prescription was entered and confirmed. The patient was discharged in stable condition and tolerated simulation well.    TREATMENT PLANNING NOTE:  Treatment planning then occurred. I have requested : MLC's, isodose plan, basic dose calculation  I personally designed and supervised the construction of 3 medically necessary complex treatment devices for the protection of critical normal structures including the lungs and contralateral breast as well as the immobilization device which is necessary for set up certainty.   3D simulation occurred. I requested and analyzed a dose volume histogram of the heart, lungs and lumpectomy cavity.     ------------------------------------------------  Thea Silversmith, MD    This document serves as a record of services personally performed by Thea Silversmith, MD. It was created on her behalf by  Lendon Collar, a trained medical scribe. The creation of this record is based on the scribe's personal observations and the provider's statements to them. This document has been checked and approved  by the attending provider.

## 2015-06-23 ENCOUNTER — Encounter: Payer: Self-pay | Admitting: *Deleted

## 2015-06-24 ENCOUNTER — Telehealth: Payer: Self-pay | Admitting: Hematology and Oncology

## 2015-06-24 NOTE — Telephone Encounter (Signed)
Called patient with rad onc follow up

## 2015-06-28 DIAGNOSIS — Z510 Encounter for antineoplastic radiation therapy: Secondary | ICD-10-CM | POA: Diagnosis not present

## 2015-06-29 ENCOUNTER — Ambulatory Visit
Admission: RE | Admit: 2015-06-29 | Discharge: 2015-06-29 | Disposition: A | Discharge status: Home / Self Care | Payer: Medicaid Other | Source: Ambulatory Visit | Attending: Radiation Oncology | Admitting: Radiation Oncology

## 2015-06-29 DIAGNOSIS — Z510 Encounter for antineoplastic radiation therapy: Secondary | ICD-10-CM | POA: Diagnosis not present

## 2015-06-30 ENCOUNTER — Ambulatory Visit: Payer: Medicaid Other

## 2015-07-03 ENCOUNTER — Ambulatory Visit
Admission: RE | Admit: 2015-07-03 | Discharge: 2015-07-03 | Disposition: A | Discharge status: Home / Self Care | Payer: Medicaid Other | Source: Ambulatory Visit | Attending: Radiation Oncology | Admitting: Radiation Oncology

## 2015-07-03 DIAGNOSIS — Z510 Encounter for antineoplastic radiation therapy: Secondary | ICD-10-CM | POA: Diagnosis not present

## 2015-07-04 ENCOUNTER — Ambulatory Visit
Admission: RE | Admit: 2015-07-04 | Discharge: 2015-07-04 | Disposition: A | Discharge status: Home / Self Care | Payer: Medicaid Other | Source: Ambulatory Visit | Attending: Radiation Oncology | Admitting: Radiation Oncology

## 2015-07-04 ENCOUNTER — Encounter: Payer: Self-pay | Admitting: Radiation Oncology

## 2015-07-04 VITALS — BP 133/75 | HR 68 | Temp 98.1°F | Ht 63.0 in | Wt 211.8 lb

## 2015-07-04 DIAGNOSIS — Z510 Encounter for antineoplastic radiation therapy: Secondary | ICD-10-CM | POA: Diagnosis not present

## 2015-07-04 DIAGNOSIS — C50411 Malignant neoplasm of upper-outer quadrant of right female breast: Secondary | ICD-10-CM | POA: Diagnosis present

## 2015-07-04 MED ORDER — ALRA NON-METALLIC DEODORANT (RAD-ONC)
1.0000 "application " | Freq: Once | TOPICAL | Status: AC
  Administered 2015-07-04: 1 via TOPICAL

## 2015-07-04 MED ORDER — RADIAPLEXRX EX GEL
Freq: Once | CUTANEOUS | Status: AC
  Administered 2015-07-04: 14:00:00 via TOPICAL

## 2015-07-04 NOTE — Progress Notes (Signed)
Weekly Management Note Current Dose: 3.6  Gy  Projected Dose: 61 Gy   Narrative:  The patient presents for routine under treatment assessment.  CBCT/MVCT images/Port film x-rays were reviewed.  The chart was checked. Doing well. Some "drawing" sensation of the breast last night. Less anxiety today. RN education performed.   Physical Findings: Weight: 211 lb 12.8 oz (96.072 kg). Unchanged  Impression:  The patient is tolerating radiation.  Plan:  Continue treatment as planned. Start radiaplex.

## 2015-07-04 NOTE — Progress Notes (Signed)
Mrs. Sautter has received 2 fractions.  Educational instructions given for Radiaplex gel and Alra deodorant.  Skin right breast normal pink color, denies any pain had muscle spasm last night.  Appetite good.  Energy level is pretty good. BP 133/75 mmHg  Pulse 68  Temp(Src) 98.1 F (36.7 C) (Oral)  Ht 5\' 3"  (1.6 m)  Wt 211 lb 12.8 oz (96.072 kg)  BMI 37.53 kg/m2  SpO2 97%

## 2015-07-04 NOTE — Addendum Note (Signed)
Encounter addended by: Malena Edman, RN on: 07/04/2015  1:46 PM<BR>     Documentation filed: Inpatient Patient Education

## 2015-07-04 NOTE — Addendum Note (Signed)
Encounter addended by: Malena Edman, RN on: 07/04/2015  1:56 PM<BR>     Documentation filed: Visit Diagnoses, Dx Association, Inpatient MAR, Orders

## 2015-07-04 NOTE — Addendum Note (Signed)
Encounter addended by: Malena Edman, RN on: 07/04/2015  4:54 PM<BR>     Documentation filed: Inpatient Patient Education

## 2015-07-04 NOTE — Addendum Note (Signed)
Encounter addended by: Malena Edman, RN on: 07/04/2015  1:56 PM<BR>     Documentation filed: Inpatient MAR

## 2015-07-05 ENCOUNTER — Ambulatory Visit
Admission: RE | Admit: 2015-07-05 | Discharge: 2015-07-05 | Disposition: A | Discharge status: Home / Self Care | Payer: Medicaid Other | Source: Ambulatory Visit | Attending: Radiation Oncology | Admitting: Radiation Oncology

## 2015-07-05 DIAGNOSIS — Z510 Encounter for antineoplastic radiation therapy: Secondary | ICD-10-CM | POA: Diagnosis not present

## 2015-07-06 ENCOUNTER — Ambulatory Visit
Admission: RE | Admit: 2015-07-06 | Discharge: 2015-07-06 | Disposition: A | Discharge status: Home / Self Care | Payer: Medicaid Other | Source: Ambulatory Visit | Attending: Radiation Oncology | Admitting: Radiation Oncology

## 2015-07-06 DIAGNOSIS — Z510 Encounter for antineoplastic radiation therapy: Secondary | ICD-10-CM | POA: Diagnosis not present

## 2015-07-07 ENCOUNTER — Ambulatory Visit
Admission: RE | Admit: 2015-07-07 | Discharge: 2015-07-07 | Disposition: A | Discharge status: Home / Self Care | Payer: Medicaid Other | Source: Ambulatory Visit | Attending: Radiation Oncology | Admitting: Radiation Oncology

## 2015-07-07 ENCOUNTER — Telehealth: Payer: Self-pay | Admitting: *Deleted

## 2015-07-07 DIAGNOSIS — Z510 Encounter for antineoplastic radiation therapy: Secondary | ICD-10-CM | POA: Diagnosis not present

## 2015-07-07 NOTE — Telephone Encounter (Signed)
Spoke with patient to follow up after start of radiation.  She states she is doing well with no complaints.  Encouraged her to call with any needs or concerns.

## 2015-07-10 ENCOUNTER — Ambulatory Visit
Admission: RE | Admit: 2015-07-10 | Discharge: 2015-07-10 | Disposition: A | Discharge status: Home / Self Care | Payer: Medicaid Other | Source: Ambulatory Visit | Attending: Radiation Oncology | Admitting: Radiation Oncology

## 2015-07-10 DIAGNOSIS — Z510 Encounter for antineoplastic radiation therapy: Secondary | ICD-10-CM | POA: Diagnosis not present

## 2015-07-11 ENCOUNTER — Ambulatory Visit
Admission: RE | Admit: 2015-07-11 | Discharge: 2015-07-11 | Disposition: A | Discharge status: Home / Self Care | Payer: Medicaid Other | Source: Ambulatory Visit | Attending: Radiation Oncology | Admitting: Radiation Oncology

## 2015-07-11 ENCOUNTER — Encounter: Payer: Self-pay | Admitting: Radiation Oncology

## 2015-07-11 VITALS — BP 111/82 | HR 102 | Temp 98.8°F | Ht 63.0 in

## 2015-07-11 DIAGNOSIS — Z510 Encounter for antineoplastic radiation therapy: Secondary | ICD-10-CM | POA: Diagnosis not present

## 2015-07-11 DIAGNOSIS — C50411 Malignant neoplasm of upper-outer quadrant of right female breast: Secondary | ICD-10-CM

## 2015-07-11 NOTE — Progress Notes (Signed)
Weekly Management Note Current Dose: 12.6  Gy  Projected Dose: 61 Gy   Narrative:  The patient presents for routine under treatment assessment.  CBCT/MVCT images/Port film x-rays were reviewed.  The chart was checked. Doing well. Questions about hair dye and spray tan. No breast related complaints.   Physical Findings: Weight:  . Unchanged. No skin changes  Impression:  The patient is tolerating radiation.  Plan:  Continue treatment as planned. Continue radiaplex. OK to dye hair and do spray tan after RT. Importance of yearly mammograms discussed.

## 2015-07-12 ENCOUNTER — Ambulatory Visit
Admission: RE | Admit: 2015-07-12 | Discharge: 2015-07-12 | Disposition: A | Discharge status: Home / Self Care | Payer: Medicaid Other | Source: Ambulatory Visit | Attending: Radiation Oncology | Admitting: Radiation Oncology

## 2015-07-12 DIAGNOSIS — Z510 Encounter for antineoplastic radiation therapy: Secondary | ICD-10-CM | POA: Diagnosis not present

## 2015-07-13 ENCOUNTER — Ambulatory Visit
Admission: RE | Admit: 2015-07-13 | Discharge: 2015-07-13 | Disposition: A | Discharge status: Home / Self Care | Payer: Medicaid Other | Source: Ambulatory Visit | Attending: Radiation Oncology | Admitting: Radiation Oncology

## 2015-07-13 DIAGNOSIS — Z510 Encounter for antineoplastic radiation therapy: Secondary | ICD-10-CM | POA: Diagnosis not present

## 2015-07-14 ENCOUNTER — Ambulatory Visit
Admission: RE | Admit: 2015-07-14 | Discharge: 2015-07-14 | Disposition: A | Discharge status: Home / Self Care | Payer: Medicaid Other | Source: Ambulatory Visit | Attending: Radiation Oncology | Admitting: Radiation Oncology

## 2015-07-14 DIAGNOSIS — Z510 Encounter for antineoplastic radiation therapy: Secondary | ICD-10-CM | POA: Diagnosis not present

## 2015-07-17 ENCOUNTER — Ambulatory Visit
Admission: RE | Admit: 2015-07-17 | Discharge: 2015-07-17 | Disposition: A | Discharge status: Home / Self Care | Payer: Medicaid Other | Source: Ambulatory Visit | Attending: Radiation Oncology | Admitting: Radiation Oncology

## 2015-07-17 DIAGNOSIS — Z510 Encounter for antineoplastic radiation therapy: Secondary | ICD-10-CM | POA: Diagnosis not present

## 2015-07-18 ENCOUNTER — Encounter: Payer: Self-pay | Admitting: Radiation Oncology

## 2015-07-18 ENCOUNTER — Ambulatory Visit
Admission: RE | Admit: 2015-07-18 | Discharge: 2015-07-18 | Disposition: A | Discharge status: Home / Self Care | Payer: Medicaid Other | Source: Ambulatory Visit | Attending: Radiation Oncology | Admitting: Radiation Oncology

## 2015-07-18 DIAGNOSIS — C50411 Malignant neoplasm of upper-outer quadrant of right female breast: Secondary | ICD-10-CM

## 2015-07-18 DIAGNOSIS — Z510 Encounter for antineoplastic radiation therapy: Secondary | ICD-10-CM | POA: Diagnosis not present

## 2015-07-18 NOTE — Progress Notes (Signed)
April [name] has received 12 fractions to her right breast.  Skin to right breast has slight tanning today, using Radiplex.  Appetite is good.  Denies pain or discomfort today.  Energy level is good. BP 108/67 mmHg  Pulse 88  Temp(Src) 97.9 F (36.6 C) (Oral)  Ht 5\' 3"  (1.6 m)  Wt 214 lb 8 oz (97.297 kg)  BMI 38.01 kg/m2  SpO2 99%

## 2015-07-18 NOTE — Progress Notes (Signed)
Weekly Management Note Current Dose: 21.6  Gy  Projected Dose: 61 Gy   Narrative:  The patient presents for routine under treatment assessment.  CBCT/MVCT images/Port film x-rays were reviewed.  The chart was checked. Doing well. No complaints.   Physical Findings: Weight:  . Unchanged. Skin is pink.   Impression:  The patient is tolerating radiation.  Plan:  Continue treatment as planned. Continue radiaplex.

## 2015-07-19 ENCOUNTER — Ambulatory Visit
Admission: RE | Admit: 2015-07-19 | Discharge: 2015-07-19 | Disposition: A | Discharge status: Home / Self Care | Payer: Medicaid Other | Source: Ambulatory Visit | Attending: Radiation Oncology | Admitting: Radiation Oncology

## 2015-07-19 DIAGNOSIS — Z510 Encounter for antineoplastic radiation therapy: Secondary | ICD-10-CM | POA: Diagnosis not present

## 2015-07-20 ENCOUNTER — Ambulatory Visit
Admission: RE | Admit: 2015-07-20 | Discharge: 2015-07-20 | Disposition: A | Discharge status: Home / Self Care | Payer: Medicaid Other | Source: Ambulatory Visit | Attending: Radiation Oncology | Admitting: Radiation Oncology

## 2015-07-20 DIAGNOSIS — Z510 Encounter for antineoplastic radiation therapy: Secondary | ICD-10-CM | POA: Diagnosis not present

## 2015-07-21 ENCOUNTER — Ambulatory Visit: Payer: Medicaid Other | Attending: Radiation Oncology | Admitting: Radiation Oncology

## 2015-07-21 ENCOUNTER — Ambulatory Visit
Admission: RE | Admit: 2015-07-21 | Discharge: 2015-07-21 | Disposition: A | Discharge status: Home / Self Care | Payer: Medicaid Other | Source: Ambulatory Visit | Attending: Radiation Oncology | Admitting: Radiation Oncology

## 2015-07-21 DIAGNOSIS — Z510 Encounter for antineoplastic radiation therapy: Secondary | ICD-10-CM | POA: Diagnosis not present

## 2015-07-24 ENCOUNTER — Ambulatory Visit
Admission: RE | Admit: 2015-07-24 | Discharge: 2015-07-24 | Disposition: A | Discharge status: Home / Self Care | Payer: Medicaid Other | Source: Ambulatory Visit | Attending: Radiation Oncology | Admitting: Radiation Oncology

## 2015-07-24 DIAGNOSIS — Z510 Encounter for antineoplastic radiation therapy: Secondary | ICD-10-CM | POA: Diagnosis not present

## 2015-07-25 ENCOUNTER — Ambulatory Visit
Admission: RE | Admit: 2015-07-25 | Discharge: 2015-07-25 | Disposition: A | Discharge status: Home / Self Care | Payer: Medicaid Other | Source: Ambulatory Visit | Attending: Radiation Oncology | Admitting: Radiation Oncology

## 2015-07-25 DIAGNOSIS — Z510 Encounter for antineoplastic radiation therapy: Secondary | ICD-10-CM | POA: Diagnosis not present

## 2015-07-25 DIAGNOSIS — C50411 Malignant neoplasm of upper-outer quadrant of right female breast: Secondary | ICD-10-CM

## 2015-07-25 NOTE — Progress Notes (Signed)
Weekly Management Note Current Dose: 30.6  Gy  Projected Dose: 61 Gy   Narrative:  The patient presents for routine under treatment assessment.  CBCT/MVCT images/Port film x-rays were reviewed.  The chart was checked. Doing well. Skin irritated and breast "sore"  Physical Findings: Weight:  . Unchanged. Pink skin.   Impression:  The patient is tolerating radiation.  Plan:  Continue treatment as planned. Continue radiaplex.

## 2015-07-26 ENCOUNTER — Ambulatory Visit
Admission: RE | Admit: 2015-07-26 | Discharge: 2015-07-26 | Disposition: A | Discharge status: Home / Self Care | Payer: Medicaid Other | Source: Ambulatory Visit | Attending: Radiation Oncology | Admitting: Radiation Oncology

## 2015-07-26 DIAGNOSIS — Z510 Encounter for antineoplastic radiation therapy: Secondary | ICD-10-CM | POA: Diagnosis not present

## 2015-07-27 ENCOUNTER — Ambulatory Visit
Admission: RE | Admit: 2015-07-27 | Discharge: 2015-07-27 | Disposition: A | Discharge status: Home / Self Care | Payer: Medicaid Other | Source: Ambulatory Visit | Attending: Radiation Oncology | Admitting: Radiation Oncology

## 2015-07-27 DIAGNOSIS — Z510 Encounter for antineoplastic radiation therapy: Secondary | ICD-10-CM | POA: Diagnosis not present

## 2015-07-28 ENCOUNTER — Ambulatory Visit
Admission: RE | Admit: 2015-07-28 | Discharge: 2015-07-28 | Disposition: A | Discharge status: Home / Self Care | Payer: Medicaid Other | Source: Ambulatory Visit | Attending: Radiation Oncology | Admitting: Radiation Oncology

## 2015-07-28 DIAGNOSIS — C50411 Malignant neoplasm of upper-outer quadrant of right female breast: Secondary | ICD-10-CM

## 2015-07-28 DIAGNOSIS — Z510 Encounter for antineoplastic radiation therapy: Secondary | ICD-10-CM | POA: Diagnosis not present

## 2015-07-28 MED ORDER — SONAFINE EX EMUL
1.0000 "application " | Freq: Two times a day (BID) | CUTANEOUS | Status: DC
  Administered 2015-07-28: 1 via TOPICAL
  Filled 2015-07-28: qty 45

## 2015-07-28 NOTE — Progress Notes (Signed)
Treatment area called c/o redness and itching to right breast assessed changed to Sonafine today.

## 2015-07-31 ENCOUNTER — Ambulatory Visit
Admission: RE | Admit: 2015-07-31 | Discharge: 2015-07-31 | Disposition: A | Discharge status: Home / Self Care | Payer: Medicaid Other | Source: Ambulatory Visit | Attending: Radiation Oncology | Admitting: Radiation Oncology

## 2015-07-31 DIAGNOSIS — Z510 Encounter for antineoplastic radiation therapy: Secondary | ICD-10-CM | POA: Diagnosis not present

## 2015-08-01 ENCOUNTER — Encounter: Payer: Self-pay | Admitting: Radiation Oncology

## 2015-08-01 ENCOUNTER — Ambulatory Visit
Admission: RE | Admit: 2015-08-01 | Discharge: 2015-08-01 | Disposition: A | Discharge status: Home / Self Care | Payer: Medicaid Other | Source: Ambulatory Visit | Attending: Radiation Oncology | Admitting: Radiation Oncology

## 2015-08-01 VITALS — BP 115/45 | HR 84 | Temp 98.1°F | Ht 63.0 in | Wt 214.0 lb

## 2015-08-01 DIAGNOSIS — Z510 Encounter for antineoplastic radiation therapy: Secondary | ICD-10-CM | POA: Diagnosis not present

## 2015-08-01 DIAGNOSIS — C50411 Malignant neoplasm of upper-outer quadrant of right female breast: Secondary | ICD-10-CM

## 2015-08-01 NOTE — Progress Notes (Signed)
Weekly Management Note Current Dose: 45  Gy  Projected Dose: 61 Gy   Narrative:  The patient presents for routine under treatment assessment.  CBCT/MVCT images/Port film x-rays were reviewed.  The chart was checked. Doing well. Skin irritated and breast "sore" still. Using sonafine 3 times a day.   Physical Findings: Weight: 214 lb (97.07 kg). Unchanged. Pink skin. No moist desquamation.   Impression:  The patient is tolerating radiation.  Plan:  Continue treatment as planned. Continue sonafine.

## 2015-08-01 NOTE — Progress Notes (Signed)
April [name] has received 16 fractions to her right breast. Skin to right breast has redness today, using Sonafine. Appetite is good. Pain 6/10 taking Ibuprofen for pain control. Energy level is good. BP 115/45 mmHg  Pulse 84  Temp(Src) 98.1 F (36.7 C) (Oral)  Ht 5\' 3"  (1.6 m)  Wt 214 lb (97.07 kg)  BMI 37.92 kg/m2  SpO2 98%

## 2015-08-02 ENCOUNTER — Ambulatory Visit
Admission: RE | Admit: 2015-08-02 | Discharge: 2015-08-02 | Disposition: A | Discharge status: Home / Self Care | Payer: Medicaid Other | Source: Ambulatory Visit | Attending: Radiation Oncology | Admitting: Radiation Oncology

## 2015-08-02 DIAGNOSIS — Z510 Encounter for antineoplastic radiation therapy: Secondary | ICD-10-CM | POA: Diagnosis not present

## 2015-08-03 ENCOUNTER — Ambulatory Visit: Admission: RE | Admit: 2015-08-03 | Payer: Medicaid Other | Source: Ambulatory Visit | Admitting: Radiation Oncology

## 2015-08-03 ENCOUNTER — Encounter: Payer: Self-pay | Admitting: Radiation Oncology

## 2015-08-03 ENCOUNTER — Ambulatory Visit
Admission: RE | Admit: 2015-08-03 | Discharge: 2015-08-03 | Disposition: A | Discharge status: Home / Self Care | Payer: Medicaid Other | Source: Ambulatory Visit | Attending: Radiation Oncology | Admitting: Radiation Oncology

## 2015-08-03 DIAGNOSIS — Z510 Encounter for antineoplastic radiation therapy: Secondary | ICD-10-CM | POA: Diagnosis not present

## 2015-08-04 ENCOUNTER — Ambulatory Visit
Admission: RE | Admit: 2015-08-04 | Discharge: 2015-08-04 | Disposition: A | Discharge status: Home / Self Care | Payer: Medicaid Other | Source: Ambulatory Visit | Attending: Radiation Oncology | Admitting: Radiation Oncology

## 2015-08-04 DIAGNOSIS — Z510 Encounter for antineoplastic radiation therapy: Secondary | ICD-10-CM | POA: Diagnosis not present

## 2015-08-07 ENCOUNTER — Ambulatory Visit
Admission: RE | Admit: 2015-08-07 | Discharge: 2015-08-07 | Disposition: A | Discharge status: Home / Self Care | Payer: Medicaid Other | Source: Ambulatory Visit | Attending: Radiation Oncology | Admitting: Radiation Oncology

## 2015-08-07 ENCOUNTER — Encounter: Payer: Self-pay | Admitting: Radiation Oncology

## 2015-08-07 VITALS — BP 119/70 | HR 85 | Temp 97.8°F | Resp 18 | Ht 63.0 in | Wt 217.6 lb

## 2015-08-07 DIAGNOSIS — C50411 Malignant neoplasm of upper-outer quadrant of right female breast: Secondary | ICD-10-CM

## 2015-08-07 DIAGNOSIS — Z510 Encounter for antineoplastic radiation therapy: Secondary | ICD-10-CM | POA: Diagnosis not present

## 2015-08-07 MED ORDER — OXYCODONE-ACETAMINOPHEN 5-325 MG PO TABS: 1.0000 | ORAL_TABLET | Freq: Every evening | ORAL | Status: DC | PRN

## 2015-08-07 NOTE — Progress Notes (Signed)
Weekly Management Note    ICD-9-CM ICD-10-CM   1. [name] ([facility]) 174.4 C50.411 oxyCODONE-acetaminophen (PERCOCET/ROXICET) 5-325 MG tablet    Current Dose: 47 Gy  Projected Dose: 61 Gy   Narrative:  The patient presents for routine under treatment assessment.  CBCT/MVCT images/Port film x-rays were reviewed.  The chart was checked. Pain 10/10 at night over right breast.  Sonafine cream being used. Taking Advil  Physical Findings: Weight: 217 lb 9.6 oz (98.703 kg)    right breast - erythematous skin. No moist desquamation.   Impression:  The patient is tolerating radiation.  Plan:  Continue treatment as planned. Continue sonafine.  Rx given for oxycodone QHS prn, (she tolerated this post-op), disp 8 tablets.  -----------------------------------  Eppie Gibson, MD

## 2015-08-07 NOTE — Progress Notes (Signed)
April [name] has receive 26 fractions to her right breast.  Skin to right breast with hyperpigmentation and burning.  Using Sonafine as instructed and not wearing a bra.  Pain today is a 10/10 taking Advil for pain control.  Appetite is fine.  Energy level is good. BP 119/70 mmHg  Pulse 85  Temp(Src) 97.8 F (36.6 C) (Oral)  Resp 18  Ht 5\' 3"  (1.6 m)  Wt 217 lb 9.6 oz (98.703 kg)  BMI 38.56 kg/m2  SpO2 99%

## 2015-08-08 ENCOUNTER — Ambulatory Visit
Admission: RE | Admit: 2015-08-08 | Discharge: 2015-08-08 | Disposition: A | Discharge status: Home / Self Care | Payer: Medicaid Other | Source: Ambulatory Visit | Attending: Radiation Oncology | Admitting: Radiation Oncology

## 2015-08-08 DIAGNOSIS — Z510 Encounter for antineoplastic radiation therapy: Secondary | ICD-10-CM | POA: Diagnosis not present

## 2015-08-09 ENCOUNTER — Ambulatory Visit
Admission: RE | Admit: 2015-08-09 | Discharge: 2015-08-09 | Disposition: A | Discharge status: Home / Self Care | Payer: Medicaid Other | Source: Ambulatory Visit | Attending: Radiation Oncology | Admitting: Radiation Oncology

## 2015-08-09 DIAGNOSIS — Z510 Encounter for antineoplastic radiation therapy: Secondary | ICD-10-CM | POA: Diagnosis not present

## 2015-08-10 ENCOUNTER — Ambulatory Visit
Admission: RE | Admit: 2015-08-10 | Discharge: 2015-08-10 | Disposition: A | Discharge status: Home / Self Care | Payer: Medicaid Other | Source: Ambulatory Visit | Attending: Radiation Oncology | Admitting: Radiation Oncology

## 2015-08-10 DIAGNOSIS — Z510 Encounter for antineoplastic radiation therapy: Secondary | ICD-10-CM | POA: Diagnosis not present

## 2015-08-11 ENCOUNTER — Ambulatory Visit
Admission: RE | Admit: 2015-08-11 | Discharge: 2015-08-11 | Disposition: A | Discharge status: Home / Self Care | Payer: Medicaid Other | Source: Ambulatory Visit | Attending: Radiation Oncology | Admitting: Radiation Oncology

## 2015-08-11 DIAGNOSIS — Z510 Encounter for antineoplastic radiation therapy: Secondary | ICD-10-CM | POA: Diagnosis not present

## 2015-08-14 ENCOUNTER — Ambulatory Visit
Admission: RE | Admit: 2015-08-14 | Discharge: 2015-08-14 | Disposition: A | Discharge status: Home / Self Care | Payer: Medicaid Other | Source: Ambulatory Visit | Attending: Radiation Oncology | Admitting: Radiation Oncology

## 2015-08-14 DIAGNOSIS — Z510 Encounter for antineoplastic radiation therapy: Secondary | ICD-10-CM | POA: Diagnosis not present

## 2015-08-15 ENCOUNTER — Ambulatory Visit
Admission: RE | Admit: 2015-08-15 | Discharge: 2015-08-15 | Disposition: A | Discharge status: Home / Self Care | Payer: Medicaid Other | Source: Ambulatory Visit | Attending: Radiation Oncology | Admitting: Radiation Oncology

## 2015-08-15 ENCOUNTER — Ambulatory Visit: Payer: Medicaid Other

## 2015-08-15 ENCOUNTER — Encounter: Payer: Self-pay | Admitting: Radiation Oncology

## 2015-08-15 VITALS — BP 133/85 | HR 76 | Temp 97.6°F | Resp 18 | Ht 63.0 in | Wt 218.8 lb

## 2015-08-15 DIAGNOSIS — C50411 Malignant neoplasm of upper-outer quadrant of right female breast: Secondary | ICD-10-CM

## 2015-08-15 DIAGNOSIS — Z510 Encounter for antineoplastic radiation therapy: Secondary | ICD-10-CM | POA: Diagnosis not present

## 2015-08-15 NOTE — Progress Notes (Signed)
Ms. Glazer has received 32 fractions to her right breast.  Right breast with dry desquamation.  Using Radiaplex gel.  Appetite is good.  Energy level is fine.  Pain to the right breast having sharp pains at times 5/10 taking Oxycodone for pain control. EOT skin care discussed to continue use of Radiplex for two weeks until redness goes away.  Then start lotion with vitamin E.  Asked if she has an appointment with her medical oncologist.  Discussed that the survivorship nurse [name] would be calling to schedule and appointment.  Given a F/U appointment card to come back in one month to see Dr. Pablo Ledger.  Patient was able to verbalize that she understood the instructions and will call with any nursing care questions. BP 133/85 mmHg  Pulse 76  Temp(Src) 97.6 F (36.4 C) (Oral)  Resp 18  Ht 5\' 3"  (1.6 m)  Wt 218 lb 12.8 oz (99.247 kg)  BMI 38.77 kg/m2  SpO2 100%

## 2015-08-15 NOTE — Progress Notes (Signed)
Weekly Management Note Current Dose: 59  Gy  Projected Dose: 61 Gy   Narrative:  The patient presents for routine under treatment assessment.  CBCT/MVCT images/Port film x-rays were reviewed.  The chart was checked. Doing well. Skin irritated and breast "sore" still. Using sonafine 3 times a day.  Finishes.   Physical Findings: Weight: 218 lb 12.8 oz (99.247 kg). Unchanged. Pink skin. No moist desquamation. Dry desquamation in axilla and inframammary fold.   Impression:  The patient is tolerating radiation.  Plan:  Continue treatment as planned. Continue sonafine. Discussed post treatment skin changes and care.

## 2015-08-16 ENCOUNTER — Telehealth: Payer: Self-pay | Admitting: *Deleted

## 2015-08-16 ENCOUNTER — Telehealth: Payer: Self-pay | Admitting: Hematology and Oncology

## 2015-08-16 ENCOUNTER — Ambulatory Visit
Admission: RE | Admit: 2015-08-16 | Discharge: 2015-08-16 | Disposition: A | Discharge status: Home / Self Care | Payer: Medicaid Other | Source: Ambulatory Visit | Attending: Radiation Oncology | Admitting: Radiation Oncology

## 2015-08-16 ENCOUNTER — Other Ambulatory Visit: Payer: Self-pay | Admitting: Adult Health

## 2015-08-16 ENCOUNTER — Encounter: Payer: Self-pay | Admitting: Radiation Oncology

## 2015-08-16 DIAGNOSIS — C50411 Malignant neoplasm of upper-outer quadrant of right female breast: Secondary | ICD-10-CM

## 2015-08-16 DIAGNOSIS — Z510 Encounter for antineoplastic radiation therapy: Secondary | ICD-10-CM | POA: Diagnosis not present

## 2015-08-16 NOTE — Telephone Encounter (Signed)
Left message for a return phone call to follow up post radiation.

## 2015-08-16 NOTE — Telephone Encounter (Signed)
Spoke with patient to confirm survivorship appt date/time in May

## 2015-08-17 ENCOUNTER — Encounter: Payer: Self-pay | Admitting: Nurse Practitioner

## 2015-08-24 ENCOUNTER — Telehealth: Payer: Self-pay | Admitting: *Deleted

## 2015-08-24 ENCOUNTER — Ambulatory Visit
Admission: RE | Admit: 2015-08-24 | Discharge: 2015-08-24 | Disposition: A | Discharge status: Home / Self Care | Payer: Medicaid Other | Source: Ambulatory Visit | Attending: Radiation Oncology | Admitting: Radiation Oncology

## 2015-08-24 DIAGNOSIS — C50411 Malignant neoplasm of upper-outer quadrant of right female breast: Secondary | ICD-10-CM

## 2015-08-24 MED ORDER — OXYCODONE-ACETAMINOPHEN 5-325 MG PO TABS: 1.0000 | ORAL_TABLET | Freq: Every evening | ORAL | Status: DC | PRN

## 2015-08-24 NOTE — Telephone Encounter (Signed)
CALLED PATIENT TO INFORM OF PT APPT. FOR 08-29-15 - ARRIVAL TIME - 10:45 AM @ [facility], LVM FOR A RETURN CALL

## 2015-08-24 NOTE — Progress Notes (Signed)
   Department of Radiation Oncology  Phone:  [phone] Fax:        [phone]   Name: April [name] MRN: [id number]  DOB: 03-31-1966  Date: 08/24/2015  Follow Up Visit Note  Diagnosis: [name] [facility])   Staging form: Breast, AJCC 7th Edition     Clinical stage from 04/05/2015: Stage IA (T1a, N0, M0) - Unsigned       Staging comments: Staged at breast conference 11.2.16      Pathologic stage from 05/16/2015: Stage IA (T1b, N0, cM0) - Unsigned       Staging comments: Staged on surgical specimen by Dr.Kish    Summary and Interval since last radiation: 2 weeks. Completed radiation 08/16/2015 to the right breast with 61 Gy.  Interval History: April [name] presents today for routine followup. She reports increasing shooting pains in the right breast, laterally and axilla. She mentions that she has no relief with Neurontin or Advil. She mentions that she takes the Oxycodone at night.  She mentions that if she rolls over in the middle of the night on it, it hurts. The pain does not shoot down her hand. She mentions that when she lifts her right arm, the pain feels deep. She denies swelling in her hands. Dr. Isidore Moos gave her Oxycodone two weeks ago and would like a refill.   Physical Exam:  There were no vitals filed for this visit. Pain in raising her right arm. Her skin is hyperpigmented including underneath the axilla. There is no moist desquation  IMPRESSION: April [name] is a 51 y.o. female that is healing well from the effects of radiation.  PLAN:  I prescribed her Oxycodone and referred her to physical therapy. I will see her back in two weeks.   Thea Silversmith, MD    This document serves as a record of services personally performed by Thea Silversmith, MD. It was created on her behalf by  Lendon Collar, a trained medical scribe. The creation of this record is based on the scribe's personal observations and the provider's statements to them.  This document has been checked and approved by the attending provider.

## 2015-08-24 NOTE — Telephone Encounter (Signed)
CALLED PATIENT TO INFORM OF 2 WK. FU WITH DR. Pablo Ledger ON 09-07-15 @ 1:50 PM, LVM FOR A RETURN CALL

## 2015-08-24 NOTE — Progress Notes (Signed)
April [name] in exam room 4. Reports increasing shooting pains right breast, laterally and axilla. No relief with Neurontin nor Advil.

## 2015-08-29 ENCOUNTER — Ambulatory Visit: Payer: Medicaid Other | Attending: Radiation Oncology | Admitting: Physical Therapy

## 2015-08-29 DIAGNOSIS — I890 Lymphedema, not elsewhere classified: Secondary | ICD-10-CM | POA: Diagnosis present

## 2015-08-29 DIAGNOSIS — M25611 Stiffness of right shoulder, not elsewhere classified: Secondary | ICD-10-CM | POA: Diagnosis present

## 2015-08-29 DIAGNOSIS — M25511 Pain in right shoulder: Secondary | ICD-10-CM | POA: Diagnosis present

## 2015-08-29 NOTE — Therapy (Addendum)
April [name], [place], [address] Phone: [phone]   Fax:  [phone]  Physical Therapy Evaluation  Patient Details  Name: April [name] MRN: [id number] Date of Birth: 11-14-1965 Referring Provider: Dr. Thea Silversmith  Encounter Date: 08/29/2015    Past Medical History  Diagnosis Date  . Hypertension   . Bipolar 1 disorder ([place])   . Schizo affective schizophrenia ([place])   . Depression, major ([place])   . Hyperlipidemia 05/06/2012  . Tobacco abuse 05/06/2012  . Depression 05/06/2012  . Obesity   . History of stroke 09/14/2014  . Convulsions/seizures ([place]) 03/16/2015  . Breast cancer ([place])   . Anxiety   . Stroke ([place])     05/02/12, no deficits    Past Surgical History  Procedure Laterality Date  . Cholecystectomy    . Abdominal hysterectomy    . Tee without cardioversion  05/06/2012    Procedure: TRANSESOPHAGEAL ECHOCARDIOGRAM (TEE);  Surgeon: Larey Dresser, MD;  Location: [facility] ENDOSCOPY;  Service: Cardiovascular;  Laterality: N/A;  . Radioactive seed guided mastectomy with axillary sentinel lymph node biopsy Right 05/12/2015    Procedure: RADIOACTIVE SEED GUIDED PARTIAL MASTECTOMY WITH AXILLARY SENTINEL LYMPH NODE BIOPSY;  Surgeon: Rolm Bookbinder, MD;  Location: [place];  Service: General;  Laterality: Right;    There were no vitals filed for this visit.  Visit Diagnosis:  Lymphedema, not elsewhere classified - Plan: PT plan of care cert/re-cert  Stiffness of right shoulder, not elsewhere classified - Plan: PT plan of care cert/re-cert  Pain in right shoulder - Plan: PT plan of care cert/re-cert                                     Long Term Clinic Goals - 08/29/15 1259    CC Long Term Goal  #1   Title Patient will be knowledgeable about self-care for right breast and flank swelling.   Baseline no knowledge   Time 4   Period Weeks   Status New    CC Long Term Goal  #2   Title Patient will be knowledgeable about right shoulder ROM HEP and self-soft tissue mobilization.   Baseline no knowledge   Time 4   Period Weeks   Status New   CC Long Term Goal  #3   Title Patient will report pain decrease of at least 25% in right breast.   Baseline up to 10/10 pain reported   Time 4   Period Weeks   Status New             Problem List Patient Active Problem List   Diagnosis Date Noted  . [name] ([place]) 04/05/2015  . Convulsions/seizures ([place]) 03/16/2015  . History of stroke 09/14/2014  . Bipolar 1 disorder ([place])   . Schizo affective schizophrenia ([place])   . Depression, major ([place])   . Stroke ([place])   . Hyperkalemia 05/08/2012  . Headache 05/08/2012  . Hallucination 05/06/2012  . Hyperlipidemia 05/06/2012  . Tobacco abuse 05/06/2012  . Depression 05/06/2012  . Hypertension 05/04/2012  . CVA (cerebral infarction) 05/04/2012  . Schizoaffective disorder, bipolar type ([place]) 05/04/2012    [name] 09/19/2015, 4:10 PM  [place], [place], [address] Phone: [phone]   Fax:  [phone]  Name: April [name] MRN: [id number] Date of Birth: 01-14-1966  Serafina Royals, PT 09/19/2015 4:10 PM  PHYSICAL THERAPY DISCHARGE SUMMARY  Visits from Start of Care: 1  Current functional level related to goals / functional outcomes: Presumably as noted above.  Patient called 09/11/15 requesting discharge, stating she had been discharged from radiation and that she chose not to do further PT, so was not seen after this visit.   Remaining deficits: Unknown, as patient did not return.   Education / Equipment: none Plan: Patient agrees to discharge.  Patient goals were not met. Patient is being discharged due to the patient's request.  ?????    Serafina Royals, PT 09/19/2015 4:10 PM

## 2015-08-30 NOTE — Progress Notes (Signed)
  Radiation Oncology         [phone] ________________________________  Name: April [name] MRN: [id number]  Date: 08/16/2015  DOB: [birth year]  End of Treatment Note  Diagnosis:   [name] [facility])   Staging form: Breast, AJCC 7th Edition     Clinical stage from 04/05/2015: Stage IA (T1a, N0, M0) - Unsigned       Staging comments: Staged at breast conference 11.2.16      Pathologic stage from 05/16/2015: Stage IA (T1b, N0, cM0) - Unsigned       Staging comments: Staged on surgical specimen by Dr.Kish    Indication for treatment: Curative    Radiation treatment dates:   07/03/2015-08/16/2015  Site/dose:    1. Right breast / 45 Gray @ 1.8 Pearline Cables per fraction x 25 fractions 2. Right breast boost / 16 Gray at [employer] per fraction x 8 fractions  Beams/energy:  1. Tangents / 15X, 6X 2. Electron Monte Carlo / 18 MeV  Narrative: The patient tolerated radiation treatment relatively well.   The patient experienced some breast soreness and dry desquamation in the axilla and inframammary fold.  Plan: The patient has completed radiation treatment. The patient will return to radiation oncology clinic for routine followup in one month. I advised them to call or return sooner if they have any questions or concerns related to their recovery or treatment.  ------------------------------------------------  Thea Silversmith, MD  This document serves as a record of services personally performed by Thea Silversmith, MD. It was created on her behalf by Arlyce Harman, a trained medical scribe. The creation of this record is based on the scribe's personal observations and the provider's statements to them. This document has been checked and approved by the attending provider.

## 2015-09-04 NOTE — Progress Notes (Signed)
   April [name] is here for a one month for follow up visit for right breast cancer  Skin status: Right breast has dark tanning without discomfort. Lotion being used: Used Sonafine and  vit E lotion.  Have you seen your medical oncologist? Date If not ,when is appointment 09-05-15 next appointment 12-06-15 ER+,have started AI or Tamoxifen? If not, why? Doing the [facility] and estradiol test results before starting Anastrozole because of her stroke history. Discuss survivorship appointment: 10-17-15 [name]  Offer referral reading material for Survivorship, [name] and [facility] given 08-17-15 Anticipated  interventions by surgeon:Did not remember her surgeon appointment date thinks it's October 2017, will make sure of the date Appetite: Good Pain:None Arm movement:: Moving right arm without difficulty.  Energy level: Has improved less fatigue. BP 125/82 mmHg  Pulse 76  Temp(Src) 98 F (36.7 C) (Oral)  Resp 18  Ht 5\' 3"  (1.6 m)  Wt 216 lb (97.977 kg)  BMI 38.27 kg/m2  SpO2 98%

## 2015-09-05 ENCOUNTER — Encounter: Payer: Self-pay | Admitting: Hematology and Oncology

## 2015-09-05 ENCOUNTER — Telehealth: Payer: Self-pay | Admitting: Hematology and Oncology

## 2015-09-05 ENCOUNTER — Ambulatory Visit (HOSPITAL_BASED_OUTPATIENT_CLINIC_OR_DEPARTMENT_OTHER): Payer: Medicaid Other | Admitting: Hematology and Oncology

## 2015-09-05 ENCOUNTER — Ambulatory Visit (HOSPITAL_BASED_OUTPATIENT_CLINIC_OR_DEPARTMENT_OTHER): Payer: Medicaid Other

## 2015-09-05 VITALS — BP 117/66 | HR 79 | Temp 97.7°F | Resp 18 | Ht 63.0 in | Wt 216.3 lb

## 2015-09-05 DIAGNOSIS — C50411 Malignant neoplasm of upper-outer quadrant of right female breast: Secondary | ICD-10-CM

## 2015-09-05 NOTE — Progress Notes (Signed)
Unable to get in to exam room prior to MD.  No assessment performed.  

## 2015-09-05 NOTE — Assessment & Plan Note (Signed)
Right lumpectomy 05/12/2015: Invasive ductal carcinoma grade 1, 0.9 cm, ADH, margins negative, 0/1 lymph nodes, T1BN0 stage IA pathologic stage, ER 100%, PR 90%, HER-2 negative ratio 1.1, Ki-67 5% Oncotype DX score 20, 13% risk of recurrence, intermediate risk Completed radiation therapy 08/16/2015  Recommendation: Tamoxifen 20 mg daily 5 years followed by anastrozole 1 mg daily 5 years Tamoxifen counseling: We discussed the risks and benefits of tamoxifen. These include but not limited to insomnia, hot flashes, mood changes, vaginal dryness, and weight gain. Although rare, serious side effects including endometrial cancer, risk of blood clots were also discussed. We strongly believe that the benefits far outweigh the risks. Patient understands these risks and consented to starting treatment. Planned treatment duration is 5-10 years of total antiestrogen therapy.  Return to clinic in 3 months for toxicity check and follow-up

## 2015-09-05 NOTE — Telephone Encounter (Signed)
appt made and avs printed °

## 2015-09-05 NOTE — Progress Notes (Signed)
Patient Care Team: Nolene Ebbs, MD as PCP - General (Internal Medicine) Rolm Bookbinder, MD as Consulting Physician (General Surgery) Nicholas Lose, MD as Consulting Physician (Hematology and Oncology) Arloa Koh, MD as Consulting Physician (Radiation Oncology)  DIAGNOSIS: [name] [facility])   Staging form: Breast, AJCC 7th Edition     Clinical stage from 04/05/2015: Stage IA (T1a, N0, M0) - Unsigned       Staging comments: Staged at breast conference 11.2.16      Pathologic stage from 05/16/2015: Stage IA (T1b, N0, cM0) - Unsigned       Staging comments: Staged on surgical specimen by [place]:   [name] ([place])   03/20/2015 Mammogram Right breast mass upper outer quadrant 4-5 mm focus, T1a N0 stage IA clinical stage, ultrasound axilla normal-appearing lymph nodes   04/05/2015 Initial Diagnosis Right breast biopsy 10:00 position: 8 cm from the nipple, invasive ductal carcinoma grade 1, ER/PR positive, HER-2 negative, ratio 1.14, Ki-67 5%   05/12/2015 Surgery Right lumpectomy: Invasive ductal carcinoma grade 1, 0.9 cm, ADH, margins negative, 0/1 lymph nodes, Stephen B N0 stage I a pathologic stage, ER 100%, PR 90%, HER-2 negative ratio 1.1, Ki-67 5%, Oncotype DX 20:13% risk of recurrence   07/03/2015 - 08/16/2015 Radiation Therapy Adjuvant radiation therapy by Dr. Pablo Ledger    CHIEF COMPLIANT: Follow-up after radiation therapy  INTERVAL HISTORY: April [name] is a 50 year old with above-mentioned history of right breast cancer currently here to discuss adjuvant treatment options. She underwent lumpectomy and radiation. She has recovered very well from effects of radiation therapy.  REVIEW OF SYSTEMS:   Constitutional: Denies fevers, chills or abnormal weight loss Eyes: Denies blurriness of vision Ears, nose, mouth, throat, and face: Denies mucositis or sore  throat Respiratory: Denies cough, dyspnea or wheezes Cardiovascular: Denies palpitation, chest discomfort Gastrointestinal:  Denies nausea, heartburn or change in bowel habits Skin: Denies abnormal skin rashes Lymphatics: Denies new lymphadenopathy or easy bruising Neurological:Denies numbness, tingling or new weaknesses Behavioral/Psych: Mood is stable, no new changes  Extremities: No lower extremity edema Breast: 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  I have reviewed the past medical history, past surgical history, social history and family history with the patient and they are unchanged from previous note.  ALLERGIES:  is allergic to morphine and related and hydrocodone.  MEDICATIONS:  Current Outpatient Prescriptions  Medication Sig Dispense Refill  . ARIPiprazole (ABILIFY) 20 MG tablet Take 20 mg by mouth daily.    Marland Kitchen aspirin 81 MG chewable tablet Chew 1 tablet (81 mg total) by mouth daily.    Marland Kitchen atorvastatin (LIPITOR) 20 MG tablet Take 1 tablet (20 mg total) by mouth daily at 6 PM. 30 tablet 2  . baclofen (LIORESAL) 20 MG tablet Take 1 tablet (20 mg total) by mouth 2 (two) times daily.    . citalopram (CELEXA) 20 MG tablet Take 30 mg by mouth daily.    Marland Kitchen gabapentin (NEURONTIN) 300 MG capsule Take 300 mg by mouth 3 (three) times daily.    Marland Kitchen ibuprofen (ADVIL,MOTRIN) 200 MG tablet Take 200 mg by mouth every 6 (six) hours as needed.    Marland Kitchen LORazepam (ATIVAN) 1 MG tablet Take 1 mg by mouth every 8 (eight) hours.    . metoprolol succinate (TOPROL-XL) 50 MG  24 hr tablet Take 50 mg by mouth daily. Take with or immediately following a meal.    . oxyCODONE-acetaminophen (PERCOCET/ROXICET) 5-325 MG tablet Take 1 tablet by mouth at bedtime as needed for severe pain. 15 tablet 0  . QUEtiapine (SEROQUEL XR) 150 MG 24 hr tablet Take 1 tablet (150 mg total) by mouth daily at 8 pm.  (Patient taking differently: Take 300 mg by mouth daily at 8 pm. ) 30 each 2  . solifenacin (VESICARE) 5 MG tablet Take 5 mg by mouth daily.    Marland Kitchen topiramate (TOPAMAX) 100 MG tablet Take 100 mg by mouth 2 (two) times daily.     No current facility-administered medications for this visit.    PHYSICAL EXAMINATION: ECOG PERFORMANCE STATUS: 1 - Symptomatic but completely ambulatory  Filed Vitals:   09/05/15 1014  BP: 117/66  Pulse: 79  Temp: 97.7 F (36.5 C)  Resp: 18   Filed Weights   09/05/15 1014  Weight: 216 lb 4.8 oz (98.113 kg)    GENERAL:alert, no distress and comfortable SKIN: skin color, texture, turgor are normal, no rashes or significant lesions EYES: normal, Conjunctiva are pink and non-injected, sclera clear OROPHARYNX:no exudate, no erythema and lips, buccal mucosa, and tongue normal  NECK: supple, thyroid normal size, non-tender, without nodularity LYMPH:  no palpable lymphadenopathy in the cervical, axillary or inguinal LUNGS: clear to auscultation and percussion with normal breathing effort HEART: regular rate & rhythm and no murmurs and no lower extremity edema ABDOMEN:abdomen soft, non-tender and normal bowel sounds MUSCULOSKELETAL:no cyanosis of digits and no clubbing  NEURO: alert & oriented x 3 with fluent speech, no focal motor/sensory deficits EXTREMITIES: No lower extremity edema  LABORATORY DATA:  I have reviewed the data as listed   Chemistry      Component Value Date/Time   NA 140 04/05/2015 1223   NA 136 05/11/2013 2045   K 4.1 04/05/2015 1223   K 3.6 05/11/2013 2045   CL 97 05/11/2013 2045   CO2 22 04/05/2015 1223   CO2 24 05/11/2013 2045   BUN 10.1 04/05/2015 1223   BUN 9 05/11/2013 2045   CREATININE 0.9 04/05/2015 1223   CREATININE 1.20* 05/11/2013 2045      Component Value Date/Time   CALCIUM 9.0 04/05/2015 1223   CALCIUM 9.7 05/11/2013 2045   ALKPHOS 165* 04/05/2015 1223   ALKPHOS 173* 02/18/2013 0236   AST 34 04/05/2015 1223    AST 27 02/18/2013 0236   ALT 39 04/05/2015 1223   ALT 50* 02/18/2013 0236   BILITOT <0.30 04/05/2015 1223   BILITOT 0.3 02/18/2013 0236       Lab Results  Component Value Date   WBC 9.3 04/05/2015   HGB 13.4 04/05/2015   HCT 40.2 04/05/2015   MCV 90.7 04/05/2015   PLT 229 04/05/2015   NEUTROABS 4.6 04/05/2015   ASSESSMENT & PLAN:  [name] ([place]) Right lumpectomy 05/12/2015: Invasive ductal carcinoma grade 1, 0.9 cm, ADH, margins negative, 0/1 lymph nodes, T1BN0 stage IA pathologic stage, ER 100%, PR 90%, HER-2 negative ratio 1.1, Ki-67 5% Oncotype DX score 20, 13% risk of recurrence, intermediate risk Completed radiation therapy 08/16/2015  Recommendation:  1. Blood tests with FSH and estradiol to determine if she is menopausal 2. If she is in menopause I will start her on anastrozole 1 mg daily 3. If she is not menopausal, we would not be putting her on tamoxifen because of her history of stroke.  We will call her the prescription for the anti-estrogen therapy based upon the results of the [facility] and estradiol.  Anastrozole counseling: We discussed the risks and benefits of anti-estrogen therapy with aromatase inhibitors. These include but not limited to insomnia, hot flashes, mood changes, vaginal dryness, bone density loss, and weight gain. We strongly believe that the benefits far outweigh the risks.Planned treatment duration is 5 years.  Return to clinic in 3 months for toxicity check and follow-up  Orders Placed This Encounter  Procedures  . Follicle stimulating hormone    Standing Status: Future     Number of Occurrences:      Standing Expiration Date: 10/09/2016  . Estradiol, Ultra Sens   The patient has a good understanding of the overall plan. she agrees with it. she will call with any problems that may develop before the next visit here.   Rulon Eisenmenger, MD 09/05/2015

## 2015-09-06 LAB — FOLLICLE STIMULATING HORMONE: FSH: 110 m[IU]/mL

## 2015-09-07 ENCOUNTER — Encounter: Payer: Self-pay | Admitting: Radiation Oncology

## 2015-09-07 ENCOUNTER — Ambulatory Visit
Admission: RE | Admit: 2015-09-07 | Discharge: 2015-09-07 | Disposition: A | Discharge status: Home / Self Care | Payer: Medicaid Other | Source: Ambulatory Visit | Attending: Radiation Oncology | Admitting: Radiation Oncology

## 2015-09-07 VITALS — BP 125/82 | HR 76 | Temp 98.0°F | Resp 18 | Ht 63.0 in | Wt 216.0 lb

## 2015-09-07 DIAGNOSIS — C50411 Malignant neoplasm of upper-outer quadrant of right female breast: Secondary | ICD-10-CM

## 2015-09-07 NOTE — Progress Notes (Signed)
   Department of Radiation Oncology  Phone:  [phone] Fax:        [phone]   Name: April [name] MRN: [id number]  DOB: 11-08-1965  Date: 09/07/2015  Follow Up Visit Note  Diagnosis: [name] [name])   Staging form: Breast, AJCC 7th Edition     Clinical stage from 04/05/2015: Stage IA (T1a, N0, M0) - Unsigned       Staging comments: Staged at breast conference 11.2.16      Pathologic stage from 05/16/2015: Stage IA (T1b, N0, cM0) - Unsigned       Staging comments: Staged on surgical specimen by Dr.Kish   Summary and Interval since last radiation: 2 weeks. Completed radiation 08/16/2015 to the right breast with 61 Gy.  Interval History: April [name] presents today for routine followup. She mentions that she used Sonafine and Vitamin E lotion. She is having the [name] and estradiol test results before starting Anastrozole because of her stroke history. She mentions that her appetite is good. She denies any pain. She is able to move her right arm without difficulty. She mentions that her energy level has improved and has less fatigue. She reports that her pain has improved.  Physical Exam:  Filed Vitals:   09/07/15 1424  BP: 125/82  Pulse: 76  Temp: 98 F (36.7 C)  TempSrc: Oral  Resp: 18  Height: 5\' 3"  (1.6 m)  Weight: 216 lb (97.977 kg)  SpO2: 98%  Her skin has hyperpigmentation but is healed well. Her inframammary fold is healed well.  IMPRESSION: April [name] is a 50 y.o. female that is healing well from the effects of radiation.  PLAN: She has a Survivorship appointment with [name] 10/17/2015. She has an appointment scheduled with Dr. Lindi Adie 12/06/2015. She is doing well. We discussed the need for follow up every 4-6 months which she has scheduled.  We discussed the need for yearly mammograms which she can schedule with her OBGYN or with medical oncology. We discussed the need for sun protection in the treated area.  She can always  call me with questions.  I will follow up with her on an as needed basis.    ------------------------------------------------  Thea Silversmith, MD    This document serves as a record of services personally performed by Thea Silversmith, MD. It was created on her behalf by  Lendon Collar, a trained medical scribe. The creation of this record is based on the scribe's personal observations and the provider's statements to them. This document has been checked and approved by the attending provider.

## 2015-09-14 ENCOUNTER — Ambulatory Visit: Payer: Medicaid Other | Admitting: Adult Health

## 2015-09-14 ENCOUNTER — Telehealth: Payer: Self-pay | Admitting: *Deleted

## 2015-09-14 NOTE — Telephone Encounter (Signed)
Pt no showed appt for today.  

## 2015-09-18 ENCOUNTER — Encounter: Payer: Self-pay | Admitting: Adult Health

## 2015-09-21 ENCOUNTER — Telehealth: Payer: Self-pay | Admitting: Hematology and Oncology

## 2015-09-21 ENCOUNTER — Telehealth: Payer: Self-pay

## 2015-09-21 NOTE — Progress Notes (Signed)
Name: April [name]   MRN: [id number]  Date:  08/03/2015   DOB: [birth year]  Status:outpatient    DIAGNOSIS: [name] [facility])   Staging form: Breast, AJCC 7th Edition     Clinical stage from 04/05/2015: Stage IA (T1a, N0, M0) - Unsigned       Staging comments: Staged at breast conference 11.2.16      Pathologic stage from 05/16/2015: Stage IA (T1b, N0, cM0) - Unsigned       Staging comments: Staged on surgical specimen by Dr.Kish    CONSENT VERIFIED: yes   SET UP: Patient is setup supine   IMMOBILIZATION:  The following immobilization was used:Custom Moldable Pillow, breast board.   NARRATIVE: April [name] underwent complex simulation and treatment planning for her boost treatment today.  Her tumor volume was outlined on the planning CT scan. The depth of her cavity was felt to be appropriate for treatment with electrons    18  MeV electrons will be prescribed to the 100%  isodose line.   I personally oversaw and approved the construction of a unique block which will be used for beam modification purposes.  An isodose plan is requested.

## 2015-09-21 NOTE — Telephone Encounter (Signed)
Spoke with patient to confirm lab only appt date/time 4/21at 10 am per 4/20 pof

## 2015-09-21 NOTE — Telephone Encounter (Signed)
Estradiol level ordered on 09/05/15 but not collected with other labs.  Pt notified of need to come in to have this drawn per Dr. Lindi Adie.  Spoke with patient who wishes to come in tomorrow morning for lab appointment.  POF sent to scheduler.

## 2015-09-22 ENCOUNTER — Other Ambulatory Visit (HOSPITAL_BASED_OUTPATIENT_CLINIC_OR_DEPARTMENT_OTHER): Payer: Medicaid Other

## 2015-09-22 ENCOUNTER — Other Ambulatory Visit: Payer: Self-pay

## 2015-09-22 DIAGNOSIS — C50411 Malignant neoplasm of upper-outer quadrant of right female breast: Secondary | ICD-10-CM

## 2015-09-23 LAB — ESTRADIOL: Estradiol: <5 pg/mL

## 2015-09-25 ENCOUNTER — Other Ambulatory Visit: Payer: Self-pay

## 2015-09-25 ENCOUNTER — Telehealth: Payer: Self-pay

## 2015-09-25 DIAGNOSIS — C50411 Malignant neoplasm of upper-outer quadrant of right female breast: Secondary | ICD-10-CM

## 2015-09-25 MED ORDER — ANASTROZOLE 1 MG PO TABS: 1.0000 mg | ORAL_TABLET | Freq: Every day | ORAL | Status: DC

## 2015-09-25 NOTE — Telephone Encounter (Signed)
Reviewed lab work with Dr. Lindi Adie.  Pt post menopausal and thus to be started on anastrozole per Dr. Lindi Adie.  Prescription sent in to pharmacy and pt notified of new prescription.  Left message for pt to call us if she has any questions.

## 2015-09-28 ENCOUNTER — Inpatient Hospital Stay: Admission: RE | Admit: 2015-09-28 | Payer: Self-pay | Source: Ambulatory Visit | Admitting: Radiation Oncology

## 2015-09-28 ENCOUNTER — Ambulatory Visit
Admission: RE | Admit: 2015-09-28 | Discharge: 2015-09-28 | Disposition: A | Discharge status: Home / Self Care | Payer: Medicaid Other | Source: Ambulatory Visit | Attending: Radiation Oncology | Admitting: Radiation Oncology

## 2015-10-17 ENCOUNTER — Ambulatory Visit (HOSPITAL_BASED_OUTPATIENT_CLINIC_OR_DEPARTMENT_OTHER): Payer: Medicaid Other | Admitting: Nurse Practitioner

## 2015-10-17 ENCOUNTER — Encounter: Payer: Self-pay | Admitting: Nurse Practitioner

## 2015-10-17 VITALS — BP 136/68 | HR 83 | Temp 97.7°F | Resp 18 | Ht 63.0 in | Wt 217.3 lb

## 2015-10-17 DIAGNOSIS — C50411 Malignant neoplasm of upper-outer quadrant of right female breast: Secondary | ICD-10-CM

## 2015-10-17 DIAGNOSIS — Z720 Tobacco use: Secondary | ICD-10-CM | POA: Diagnosis not present

## 2015-10-17 NOTE — Progress Notes (Signed)
CLINIC:  Cancer Survivorship   REASON FOR VISIT:  Routine follow-up post-treatment for a recent history of breast cancer.  BRIEF ONCOLOGIC HISTORY:    [name] ([place])   03/20/2015 Mammogram Right breast mass upper outer quadrant 4-5 mm focus, ultrasound axilla normal-appearing lymph nodes   04/05/2015 Initial Diagnosis Right breast biopsy 10:00 position: 8 cm from the nipple, invasive ductal carcinoma grade 1, ER/PR positive, HER-2 negative, ratio 1.14, Ki-67 5%   04/05/2015 Clinical Stage Stage IA: T1a N0   05/12/2015 Surgery Right lumpectomy: Invasive ductal carcinoma grade 1, 0.9 cm, ADH, margins negative, 0/1 lymph nodes, ER 100%, PR 90%, HER-2 negative ratio 1.1, Ki-67 5%   05/12/2015 Pathologic Stage Stage IA: T1b N0   05/12/2015 Oncotype testing RS 20 (13% ROR)   07/03/2015 - 08/16/2015 Radiation Therapy Adjuvant radiation therapy by Dr. Pablo Ledger: Right breast / 45 Pearline Cables @ 1.8 Pearline Cables per fraction x 25 fractions; right breast boost / 16 Gray at [employer] per fraction x 8 fractions   09/25/2015 -  Anti-estrogen oral therapy Anastrozole 1 mg daily    INTERVAL HISTORY:  Ms. Petro presents to the [facility] today for our initial meeting to review her survivorship care plan detailing her treatment course for breast cancer, as well as monitoring long-term side effects of that treatment, education regarding health maintenance, screening, and overall wellness and health promotion.     Overall, Ms. Feely reports feeling quite well since completing her radiation therapy approximately two months ago.  She continues with fatigue and reports the skin changes overlying her breast have improved.  She has noted some thickening in her right breast following the radiation, but denies any mass. She does experience a stinging, burning sensation across her right breast intermittently. She has had no headache, cough, shortness of breath or bone pain. She  continues to smoke.  She has begun her anastrozole and is experiencing hot flashes and night sweats.  She states that she is having difficulty sleeping due to the hot flashes.  She denies vaginal changes.  She does not regularly exercise.  She has a good appetite and denies weight loss.    REVIEW OF SYSTEMS:  General: Fatigue, hot flashes and night sweats, as above.  Denies fever, chills, or unintentional weight loss. HEENT: Wears glasses. Denies visual changes, hearing loss, mouth sores or difficulty swallowing. Cardiac: Denies palpitations, chest pain, and lower extremity edema.  Respiratory: Denies wheeze or dyspnea on exertion.  Breast: As above. GI: Denies abdominal pain, constipation, diarrhea, nausea, or vomiting.  GU: Denies dysuria, hematuria, vaginal bleeding, vaginal discharge, or vaginal dryness.  Musculoskeletal: Denies joint or bone pain.  Neuro: Denies recent fall or numbness / tingling in her extremities. Skin: Denies rash, pruritis, or open wounds.  Psych: Mild anxiety and depression. Insomnia as above. Denies memory loss.   A 14-point review of systems was completed and was negative, except as noted above.   ONCOLOGY TREATMENT TEAM:  1. Surgeon:  Dr. Donne Hazel at [facility]  2. Medical Oncologist: Dr. Lindi Adie 3. Radiation Oncologist: Dr. Pablo Ledger    PAST MEDICAL/SURGICAL HISTORY:  Past Medical History  Diagnosis Date  . Hypertension   . Bipolar 1 disorder ([place])   . Schizo affective schizophrenia ([place])   . Depression, major ([place])   . Hyperlipidemia 05/06/2012  . Tobacco abuse 05/06/2012  . Depression 05/06/2012  . Obesity   . History of stroke 09/14/2014  . Convulsions/seizures ([place]) 03/16/2015  .  Breast cancer ([place])   . Anxiety   . Stroke ([place])     05/02/12, no deficits   Past Surgical History  Procedure Laterality Date  . Cholecystectomy    . Abdominal hysterectomy    . Tee without cardioversion  05/06/2012    Procedure: TRANSESOPHAGEAL  ECHOCARDIOGRAM (TEE);  Surgeon: Larey Dresser, MD;  Location: [facility] ENDOSCOPY;  Service: Cardiovascular;  Laterality: N/A;  . Radioactive seed guided mastectomy with axillary sentinel lymph node biopsy Right 05/12/2015    Procedure: RADIOACTIVE SEED GUIDED PARTIAL MASTECTOMY WITH AXILLARY SENTINEL LYMPH NODE BIOPSY;  Surgeon: Rolm Bookbinder, MD;  Location: [place];  Service: General;  Laterality: Right;     ALLERGIES:  Allergies  Allergen Reactions  . Morphine And Related Hives and Itching  . Hydrocodone Rash     CURRENT MEDICATIONS:  Current Outpatient Prescriptions on File Prior to Visit  Medication Sig Dispense Refill  . anastrozole (ARIMIDEX) 1 MG tablet Take 1 tablet (1 mg total) by mouth daily. 30 tablet 2  . ARIPiprazole (ABILIFY) 20 MG tablet Take 20 mg by mouth daily.    Marland Kitchen aspirin 81 MG chewable tablet Chew 1 tablet (81 mg total) by mouth daily.    Marland Kitchen atorvastatin (LIPITOR) 20 MG tablet Take 1 tablet (20 mg total) by mouth daily at 6 PM. 30 tablet 2  . baclofen (LIORESAL) 20 MG tablet Take 1 tablet (20 mg total) by mouth 2 (two) times daily.    . citalopram (CELEXA) 20 MG tablet Take 30 mg by mouth daily.    Marland Kitchen gabapentin (NEURONTIN) 300 MG capsule Take 300 mg by mouth 3 (three) times daily.    Marland Kitchen ibuprofen (ADVIL,MOTRIN) 200 MG tablet Take 200 mg by mouth every 6 (six) hours as needed.    Marland Kitchen LORazepam (ATIVAN) 1 MG tablet Take 1 mg by mouth every 8 (eight) hours.    . metoprolol succinate (TOPROL-XL) 50 MG 24 hr tablet Take 50 mg by mouth daily. Take with or immediately following a meal.    . QUEtiapine (SEROQUEL XR) 150 MG 24 hr tablet Take 1 tablet (150 mg total) by mouth daily at 8 pm. (Patient taking differently: Take 300 mg by mouth daily at 8 pm. ) 30 each 2  . solifenacin (VESICARE) 5 MG tablet Take 5 mg by mouth daily.    Marland Kitchen topiramate (TOPAMAX) 100 MG tablet Take 100 mg by mouth 2 (two) times daily.     No current facility-administered medications on  file prior to visit.     ONCOLOGIC FAMILY HISTORY:  Family History  Problem Relation Age of Onset  . Multiple sclerosis Sister   . COPD Sister   . COPD Sister   . Anxiety disorder Brother   . COPD Mother   . COPD Father      GENETIC COUNSELING/TESTING: No    SOCIAL HISTORY:  Temprance Wyre is separated and lives with a friend in [place], [place].  She has 3 children. Ms. Gade is currently disabled.  She is a current 1/2 PPD smoker.  She rarely uses alcohol due to its interaction with some of her medication.  She uses recreation marijuana and denies any other illicit drug use.     PHYSICAL EXAMINATION:  Vital Signs: Filed Vitals:   10/17/15 1054  BP: 136/68  Pulse: 83  Temp: 97.7 F (36.5 C)  Resp: 18   ECOG Performance Status: 0  General: Well-nourished, well-appearing female in no acute distress.  She is unaccompanied in  clinic today.   HEENT: Head is atraumatic and normocephalic.  Pupils equal and reactive to light and accomodation. Conjunctivae clear without exudate.  Sclerae anicteric. Oral mucosa is pink, moist, and intact without lesions.  Oropharynx is pink without lesions or erythema.  Lymph: No cervical, supraclavicular, infraclavicular, or axillary lymphadenopathy noted on palpation.  Cardiovascular: Regular rate and rhythm without murmurs, rubs, or gallops. Respiratory: Clear to auscultation bilaterally. Chest expansion symmetric without accessory muscle use on inspiration or expiration.  GI: Abdomen soft and round. No tenderness to palpation. Bowel sounds normoactive in 4 quadrants. GU: Deferred.  Neuro: No focal deficits. Steady gait.  Psych: Mood and affect normal and appropriate for situation.  Extremities: No edema, cyanosis, or clubbing.  Skin: Warm and dry. No open lesions noted.   LABORATORY DATA:  None for this visit.  DIAGNOSTIC IMAGING:  None for this visit.     ASSESSMENT AND PLAN:   1. Breast cancer: Stage IA invasive ductal  carcinoma of the right breast (04/2015), ER positive, PR positive, HER2/neu negative, S/P lumpectomy (05/2015) S/P adjuvant radiation therapy to the right breast completed 08/2015 with adjuvant endocrine therapy with anastrozole begun 09/2015.  Ms. Zawadzki doing well without clinical symptoms worrisome for disease recurrence. She will follow-up with her medical oncologist,  Dr. Lindi Adie, in July 2017 with history and physical examination per surveillance protocol and her surgeon, Dr. Donne Hazel every six months.  She will continue her anti-estrogen therapy with anastrozole at this time.  I have advised her that her hot flashes and night sweats may improve slightly as she has only been on the medication for a few weeks.  We have dicussed non-pharmacologic methods to aid in her hot flashes including reducing / eliminating caffeine.  She was instructed to make [place] aware if she begins to experience any new or increased side effects of the medication. A comprehensive survivorship care plan and treatment summary was reviewed with the patient today detailing her breast cancer diagnosis, treatment course, potential late/long-term effects of treatment, appropriate follow-up care with recommendations for the future, and patient education resources.  A copy of this summary, along with a letter will be sent to the patient's primary care provider via in basket message after today's visit.  Ms. Slabach is welcome to return to the Survivorship Clinic in the future, as needed; no follow-up will be scheduled at this time.    2. Bone health:  Given Ms. Armitage's age/history of breast cancer and her current treatment regimen including endocrine therapy with anastrozole, she is at risk for bone demineralization. In review of her medical records, I do not see that she has her bone density checked and I have encouraged her to discuss this with Dr. Lindi Adie at her upcoming appointment. We will continue to monitor this closely while she is on  endocrine therapy.  In the meantime, she was encouraged to increase her consumption of foods rich in calcium and vitamin D as well as to increase her weight-bearing activities.  She was given education on specific activities to promote bone health.  3. Cancer screening:  Due to Ms. Apfel's history and her age, she should receive screening for skin cancers and colon cancer. She is S/P hysterectomy. The information and recommendations are listed on the patient's comprehensive care plan/treatment summary and were reviewed in detail with the patient.    4. Health maintenance and wellness promotion, including weight loss and smoking cessation: Ms. Lehr was counseled regarding her weight and the need to lose weight.  We reviewed the "Nutrition Rainbow" handout, as well as discussed recommendations to maximize nutrition and minimize recurrence, such as increased intake of fruits, vegetables, lean proteins, and minimizing the intake of red meats and processed foods.  She drinks excessive amounts of soda daily (~2 2L bottles) and I strongly encouraged her to use this a staring point to reduce her caloric intake. She was also encouraged to engage in moderate to vigorous exercise for 30 minutes per day most days of the week. We discussed the [employer] fitness program, which is designed for cancer survivors to help them become more physically fit after cancer treatments.  She was instructed to limit her alcohol consumption and was strongly encouraged to stop smoking.  A copy of the "Take Control of Your Health" brochure and smoking cessation materials were given to her reinforcing these recommendations.   5. Support services/counseling: It is not uncommon for this period of the patient's cancer care trajectory to be one of many emotions and stressors.  We discussed an opportunity for her to participate in the next session of [facility] ("Finding Your New Normal") support group series designed for patients after they  have completed treatment.   Ms. Longo was encouraged to take advantage of our many other support services programs, support groups, and/or counseling in coping with her new life as a cancer survivor after completing anti-cancer treatment.  She was offered support today through active listening and expressive supportive counseling.  She was given information regarding our available services and encouraged to contact me with any questions or for help enrolling in any of our support group/programs.    A total of 50 minutes of face-to-face time was spent with this patient with greater than 50% of that time in counseling and care-coordination.   Sylvan Cheese, NP  Survivorship Program [facility] [phone]   Note: PRIMARY CARE PROVIDER Philis Fendt, [place] [phone]

## 2015-12-04 ENCOUNTER — Other Ambulatory Visit: Payer: Self-pay

## 2015-12-04 DIAGNOSIS — C50411 Malignant neoplasm of upper-outer quadrant of right female breast: Secondary | ICD-10-CM

## 2015-12-05 NOTE — Assessment & Plan Note (Signed)
Right lumpectomy 05/12/2015: Invasive ductal carcinoma grade 1, 0.9 cm, ADH, margins negative, 0/1 lymph nodes, T1BN0 stage IA pathologic stage, ER 100%, PR 90%, HER-2 negative ratio 1.1, Ki-67 5% Oncotype DX score 20, 13% risk of recurrence, intermediate risk Completed radiation therapy 08/16/2015 Estradiol: < 5; FSH: 110 Menopausal  Current Treatment: Anastrozole 1 mg daily started 09/25/15  Anastrozole Toxicities:  RTC in 6 months for follow up

## 2015-12-06 ENCOUNTER — Other Ambulatory Visit (HOSPITAL_BASED_OUTPATIENT_CLINIC_OR_DEPARTMENT_OTHER): Payer: Medicaid Other

## 2015-12-06 ENCOUNTER — Ambulatory Visit (HOSPITAL_BASED_OUTPATIENT_CLINIC_OR_DEPARTMENT_OTHER): Payer: Medicaid Other | Admitting: Hematology and Oncology

## 2015-12-06 ENCOUNTER — Telehealth: Payer: Self-pay | Admitting: Hematology and Oncology

## 2015-12-06 ENCOUNTER — Encounter: Payer: Self-pay | Admitting: Hematology and Oncology

## 2015-12-06 VITALS — BP 117/64 | HR 82 | Temp 98.5°F | Resp 18 | Ht 63.0 in | Wt 215.6 lb

## 2015-12-06 DIAGNOSIS — Z79811 Long term (current) use of aromatase inhibitors: Secondary | ICD-10-CM | POA: Diagnosis not present

## 2015-12-06 DIAGNOSIS — C50411 Malignant neoplasm of upper-outer quadrant of right female breast: Secondary | ICD-10-CM

## 2015-12-06 LAB — COMPREHENSIVE METABOLIC PANEL
ALBUMIN: 3.7 g/dL (ref 3.5–5.0)
ALK PHOS: 135 U/L (ref 40–150)
ALT: 22 U/L (ref 0–55)
ANION GAP: 8 meq/L (ref 3–11)
AST: 16 U/L (ref 5–34)
BUN: 12.2 mg/dL (ref 7.0–26.0)
CO2: 22 mEq/L (ref 22–29)
Calcium: 9.2 mg/dL (ref 8.4–10.4)
Chloride: 108 mEq/L (ref 98–109)
Creatinine: 1 mg/dL (ref 0.6–1.1)
EGFR: 63 mL/min/{1.73_m2} — AB (ref >90)
GLUCOSE: 115 mg/dL (ref 70–140)
POTASSIUM: 4.2 meq/L (ref 3.5–5.1)
SODIUM: 138 meq/L (ref 136–145)
Total Bilirubin: 0.36 mg/dL (ref 0.20–1.20)
Total Protein: 7.3 g/dL (ref 6.4–8.3)

## 2015-12-06 LAB — CBC WITH DIFFERENTIAL/PLATELET
BASO%: 1.5 % (ref 0.0–2.0)
BASOS ABS: 0.1 10*3/uL (ref 0.0–0.1)
EOS ABS: 0.1 10*3/uL (ref 0.0–0.5)
EOS%: 1.4 % (ref 0.0–7.0)
HCT: 43.5 % (ref 34.8–46.6)
HEMOGLOBIN: 14.4 g/dL (ref 11.6–15.9)
LYMPH%: 37.6 % (ref 14.0–49.7)
MCH: 30.2 pg (ref 25.1–34.0)
MCHC: 33 g/dL (ref 31.5–36.0)
MCV: 91.6 fL (ref 79.5–101.0)
MONO#: 0.6 10*3/uL (ref 0.1–0.9)
MONO%: 7.3 % (ref 0.0–14.0)
NEUT#: 4.1 10*3/uL (ref 1.5–6.5)
NEUT%: 52.2 % (ref 38.4–76.8)
PLATELETS: 249 10*3/uL (ref 145–400)
RBC: 4.75 10*6/uL (ref 3.70–5.45)
RDW: 14.2 % (ref 11.2–14.5)
WBC: 7.8 10*3/uL (ref 3.9–10.3)
lymph#: 2.9 10*3/uL (ref 0.9–3.3)

## 2015-12-06 NOTE — Telephone Encounter (Signed)
Gave patient avs report and appointments for January 2018.  °

## 2015-12-06 NOTE — Progress Notes (Signed)
Patient Care Team: Nolene Ebbs, MD as PCP - General (Internal Medicine) Rolm Bookbinder, MD as Consulting Physician (General Surgery) Nicholas Lose, MD as Consulting Physician (Hematology and Oncology) Arloa Koh, MD as Consulting Physician (Radiation Oncology) Thea Silversmith, MD as Consulting Physician (Radiation Oncology) Sylvan Cheese, NP as Nurse Practitioner (Hematology and Oncology)  DIAGNOSIS: [name] [name])   Staging form: Breast, AJCC 7th Edition     Clinical stage from 04/05/2015: Stage IA (T1a, N0, M0) - Unsigned       Staging comments: Staged at breast conference 11.2.16      Pathologic stage from 05/16/2015: Stage IA (T1b, N0, cM0) - Unsigned       Staging comments: Staged on surgical specimen by [place]:   [name] ([place])   03/20/2015 Mammogram Right breast mass upper outer quadrant 4-5 mm focus, ultrasound axilla normal-appearing lymph nodes   04/05/2015 Initial Diagnosis Right breast biopsy 10:00 position: 8 cm from the nipple, invasive ductal carcinoma grade 1, ER/PR positive, HER-2 negative, ratio 1.14, Ki-67 5%   04/05/2015 Clinical Stage Stage IA: T1a N0   05/12/2015 Surgery Right lumpectomy: Invasive ductal carcinoma grade 1, 0.9 cm, ADH, margins negative, 0/1 lymph nodes, ER 100%, PR 90%, HER-2 negative ratio 1.1, Ki-67 5%   05/12/2015 Pathologic Stage Stage IA: T1b N0   05/12/2015 Oncotype testing RS 20 (13% ROR)   07/03/2015 - 08/16/2015 Radiation Therapy Adjuvant radiation therapy by Dr. Pablo Ledger: Right breast / 45 Pearline Cables @ 1.8 Pearline Cables per fraction x 25 fractions; right breast boost / 16 Gray at [employer] per fraction x 8 fractions   09/25/2015 -  Anti-estrogen oral therapy Anastrozole 1 mg daily   10/17/2015 Survivorship Survivorship visit completed and copy of care plan given to patient    CHIEF COMPLIANT: Follow-up on  anastrozole  INTERVAL HISTORY: April [name] is a 50 year old with above-mentioned history of right breast cancer treated with lumpectomy and adjuvant radiation is currently on anastrozole therapy. She appears to be tolerating it fairly well. She does have occasional hot flashes and musculoskeletal aches and pains both of which have been present even prior to starting anastrozole therapy. She does not report them as being serious or significant. She has been feeling quite well ever since she switched the anastrozole from evening to morning.  REVIEW OF SYSTEMS:   Constitutional: Denies fevers, chills or abnormal weight loss Eyes: Denies blurriness of vision Ears, nose, mouth, throat, and face: Denies mucositis or sore throat Respiratory: Denies cough, dyspnea or wheezes Cardiovascular: Denies palpitation, chest discomfort Gastrointestinal:  Denies nausea, heartburn or change in bowel habits Skin: Denies abnormal skin rashes Lymphatics: Denies new lymphadenopathy or easy bruising Neurological:Denies numbness, tingling or new weaknesses Behavioral/Psych: Mood is stable, no new changes  Extremities: No lower extremity edema Breast:  denies any pain or lumps or nodules in either breasts All other systems were reviewed with the patient and are negative.  I have reviewed the past medical history, past surgical history, social history and family history with the patient and they are unchanged from previous note.  ALLERGIES:  is allergic to morphine and related and hydrocodone.  MEDICATIONS:  Current Outpatient Prescriptions  Medication Sig Dispense Refill  . anastrozole (ARIMIDEX) 1 MG tablet Take 1 tablet (1 mg total) by mouth daily. 30 tablet 2  . ARIPiprazole (ABILIFY) 20 MG tablet Take 20 mg by mouth daily.    Marland Kitchen  aspirin 81 MG chewable tablet Chew 1 tablet (81 mg total) by mouth daily.    . atorvastatin (LIPITOR) 20 MG tablet Take 1 tablet (20 mg total) by mouth daily at 6 PM. 30 tablet 2  .  baclofen (LIORESAL) 20 MG tablet Take 1 tablet (20 mg total) by mouth 2 (two) times daily.    . citalopram (CELEXA) 20 MG tablet Take 30 mg by mouth daily.    . gabapentin (NEURONTIN) 300 MG capsule Take 300 mg by mouth 3 (three) times daily.    . ibuprofen (ADVIL,MOTRIN) 200 MG tablet Take 200 mg by mouth every 6 (six) hours as needed.    . LORazepam (ATIVAN) 1 MG tablet Take 1 mg by mouth every 8 (eight) hours.    . metoprolol succinate (TOPROL-XL) 50 MG 24 hr tablet Take 50 mg by mouth daily. Take with or immediately following a meal.    . QUEtiapine (SEROQUEL XR) 150 MG 24 hr tablet Take 1 tablet (150 mg total) by mouth daily at 8 pm. (Patient taking differently: Take 300 mg by mouth daily at 8 pm. ) 30 each 2  . solifenacin (VESICARE) 5 MG tablet Take 5 mg by mouth daily.    . topiramate (TOPAMAX) 100 MG tablet Take 100 mg by mouth 2 (two) times daily.     No current facility-administered medications for this visit.    PHYSICAL EXAMINATION: ECOG PERFORMANCE STATUS: 1 - Symptomatic but completely ambulatory  Filed Vitals:   12/06/15 0929  BP: 117/64  Pulse: 82  Temp: 98.5 F (36.9 C)  Resp: 18   Filed Weights   12/06/15 0929  Weight: 215 lb 9.6 oz (97.796 kg)    GENERAL:alert, no distress and comfortable SKIN: skin color, texture, turgor are normal, no rashes or significant lesions EYES: normal, Conjunctiva are pink and non-injected, sclera clear OROPHARYNX:no exudate, no erythema and lips, buccal mucosa, and tongue normal  NECK: supple, thyroid normal size, non-tender, without nodularity LYMPH:  no palpable lymphadenopathy in the cervical, axillary or inguinal LUNGS: clear to auscultation and percussion with normal breathing effort HEART: regular rate & rhythm and no murmurs and no lower extremity edema ABDOMEN:abdomen soft, non-tender and normal bowel sounds MUSCULOSKELETAL:no cyanosis of digits and no clubbing  NEURO: alert & oriented x 3 with fluent speech, no focal  motor/sensory deficits EXTREMITIES: No lower extremity edema  LABORATORY DATA:  I have reviewed the data as listed   Chemistry      Component Value Date/Time   NA 140 04/05/2015 1223   NA 136 05/11/2013 2045   K 4.1 04/05/2015 1223   K 3.6 05/11/2013 2045   CL 97 05/11/2013 2045   CO2 22 04/05/2015 1223   CO2 24 05/11/2013 2045   BUN 10.1 04/05/2015 1223   BUN 9 05/11/2013 2045   CREATININE 0.9 04/05/2015 1223   CREATININE 1.20* 05/11/2013 2045      Component Value Date/Time   CALCIUM 9.0 04/05/2015 1223   CALCIUM 9.7 05/11/2013 2045   ALKPHOS 165* 04/05/2015 1223   ALKPHOS 173* 02/18/2013 0236   AST 34 04/05/2015 1223   AST 27 02/18/2013 0236   ALT 39 04/05/2015 1223   ALT 50* 02/18/2013 0236   BILITOT <0.30 04/05/2015 1223   BILITOT 0.3 02/18/2013 0236       Lab Results  Component Value Date   WBC 9.3 04/05/2015   HGB 13.4 04/05/2015   HCT 40.2 04/05/2015   MCV 90.7 04/05/2015   PLT 229 04/05/2015     NEUTROABS 4.6 04/05/2015     ASSESSMENT & PLAN:  [name] ([facility]) Right lumpectomy 05/12/2015: Invasive ductal carcinoma grade 1, 0.9 cm, ADH, margins negative, 0/1 lymph nodes, T1BN0 stage IA pathologic stage, ER 100%, PR 90%, HER-2 negative ratio 1.1, Ki-67 5% Oncotype DX score 20, 13% risk of recurrence, intermediate risk Completed radiation therapy 08/16/2015 Estradiol: < 5; FSH: 110 Menopausal  Current Treatment: Anastrozole 1 mg daily started 09/25/15  Anastrozole Toxicities: 1. Occasional hot flashes but they were present even prior to starting the treatment 2. Muscular skeletal aches and pains also present prior to starting anastrozole  I provided the patient information for the [employer] live [employer] program.  RTC in 6 months for follow up   No orders of the defined types were placed in this encounter.   The patient has a good understanding of the overall plan. she agrees with it. she will call with any  problems that may develop before the next visit here.   ,  K, MD 12/06/2015     

## 2016-01-02 ENCOUNTER — Other Ambulatory Visit: Payer: Self-pay | Admitting: Hematology and Oncology

## 2016-01-02 DIAGNOSIS — C50411 Malignant neoplasm of upper-outer quadrant of right female breast: Secondary | ICD-10-CM

## 2016-01-02 NOTE — Telephone Encounter (Signed)
Chart reviewed.

## 2016-01-10 ENCOUNTER — Encounter (HOSPITAL_COMMUNITY): Payer: Self-pay | Admitting: *Deleted

## 2016-01-10 ENCOUNTER — Ambulatory Visit (HOSPITAL_COMMUNITY)
Admission: EM | Admit: 2016-01-10 | Discharge: 2016-01-10 | Disposition: A | Discharge status: Home / Self Care | Payer: Medicaid Other | Attending: Family Medicine | Admitting: Family Medicine

## 2016-01-10 ENCOUNTER — Ambulatory Visit (INDEPENDENT_AMBULATORY_CARE_PROVIDER_SITE_OTHER): Payer: Medicaid Other

## 2016-01-10 DIAGNOSIS — S300XXA Contusion of lower back and pelvis, initial encounter: Secondary | ICD-10-CM | POA: Diagnosis not present

## 2016-01-10 DIAGNOSIS — W1809XA Striking against other object with subsequent fall, initial encounter: Secondary | ICD-10-CM

## 2016-01-10 DIAGNOSIS — T148 Other injury of unspecified body region: Secondary | ICD-10-CM

## 2016-01-10 DIAGNOSIS — T148XXA Other injury of unspecified body region, initial encounter: Secondary | ICD-10-CM

## 2016-01-10 DIAGNOSIS — S39012A Strain of muscle, fascia and tendon of lower back, initial encounter: Secondary | ICD-10-CM

## 2016-01-10 MED ORDER — KETOROLAC TROMETHAMINE 60 MG/2ML IM SOLN
INTRAMUSCULAR | Status: AC
  Filled 2016-01-10: qty 2

## 2016-01-10 MED ORDER — KETOROLAC TROMETHAMINE 60 MG/2ML IM SOLN
60.0000 mg | Freq: Once | INTRAMUSCULAR | Status: AC
  Administered 2016-01-10: 60 mg via INTRAMUSCULAR

## 2016-01-10 MED ORDER — OXYCODONE-ACETAMINOPHEN 5-325 MG PO TABS
1.0000 | ORAL_TABLET | ORAL | 0 refills | Status: DC | PRN
Start: 2016-01-10 — End: 2016-11-04

## 2016-01-10 NOTE — Discharge Instructions (Signed)
Heat and stretches as demonstrated frequently and daily.

## 2016-01-10 NOTE — ED Notes (Signed)
Pt  Reports  Pain in  r  Lower  Back  And   Somewhat  On the  r  Side

## 2016-01-10 NOTE — ED Provider Notes (Signed)
CSN: [id number]     Arrival date & time 01/10/16  1657 History   First MD Initiated Contact with Patient 01/10/16 1753     Chief Complaint  Patient presents with  . Back Pain   (Consider location/radiation/quality/duration/timing/severity/associated sxs/prior Treatment) 50 year old female states that she sat down hard little over 3 weeks ago. She experienced pain in the buttock radiating superiorly along the lumbar spine and paralumbar musculature. She saw her PCP treated her with diclofenac gel, tramadol and baclofen. She states the pain is gotten worse in the past 2 days in the medication is not working. Denies focal paresthesias or weakness. Denies loss of control of bowel or bladder.  Patient's allergies include morphine, Dilaudid, hydrocodone. She has ever history of taking oxycodone with 2 prescriptions in March of this year. [place] controlled substance check performed.      Past Medical History:  Diagnosis Date  . Anxiety   . Bipolar 1 disorder ([place])   . Breast cancer ([place])   . Convulsions/seizures ([place]) 03/16/2015  . Depression 05/06/2012  . Depression, major ([place])   . History of stroke 09/14/2014  . Hyperlipidemia 05/06/2012  . Hypertension   . Obesity   . Schizo affective schizophrenia ([place])   . Stroke ([place])    05/02/12, no deficits  . Tobacco abuse 05/06/2012   Past Surgical History:  Procedure Laterality Date  . ABDOMINAL HYSTERECTOMY    . CHOLECYSTECTOMY    . RADIOACTIVE SEED GUIDED MASTECTOMY WITH AXILLARY SENTINEL LYMPH NODE BIOPSY Right 05/12/2015   Procedure: RADIOACTIVE SEED GUIDED PARTIAL MASTECTOMY WITH AXILLARY SENTINEL LYMPH NODE BIOPSY;  Surgeon: Rolm Bookbinder, MD;  Location: [facility];  Service: General;  Laterality: Right;  . TEE WITHOUT CARDIOVERSION  05/06/2012   Procedure: TRANSESOPHAGEAL ECHOCARDIOGRAM (TEE);  Surgeon: Larey Dresser, MD;  Location: [facility] ENDOSCOPY;  Service: Cardiovascular;  Laterality: N/A;   Family History  Problem  Relation Age of Onset  . Multiple sclerosis Sister   . COPD Sister   . Anxiety disorder Brother   . COPD Mother   . COPD Father   . COPD Sister    Social History  Substance Use Topics  . Smoking status: Current Every Day Smoker    Packs/day: 0.50    Types: Cigarettes  . Smokeless tobacco: Never Used  . Alcohol use No     Comment: ocassional   OB History    Gravida Para Term Preterm AB Living   3 3 3     3    SAB TAB Ectopic Multiple Live Births           3      Obstetric Comments   She menarched at early age of 89  She had 3 pregnancy, her first child was born at age 30  She has not received birth control pills.  She was never exposed to fertility medications or hormone replacement therapy.  She has no family history of Br east/GYN/GI cancer      Review of Systems  HENT: Negative.   Respiratory: Negative.   Gastrointestinal: Negative.   Musculoskeletal: Positive for back pain and myalgias. Negative for neck pain and neck stiffness.  Skin: Negative.   Neurological: Negative for tremors, speech difficulty and numbness.  All other systems reviewed and are negative.   Allergies  Morphine and related and Hydrocodone  Home Medications   Prior to Admission medications   Medication Sig Start Date End Date Taking? Authorizing Provider  anastrozole (ARIMIDEX) 1 MG tablet TAKE 1  TABLET BY MOUTH DAILY 01/02/16   Nicholas Lose, MD  ARIPiprazole (ABILIFY) 20 MG tablet Take 20 mg by mouth daily.    Historical Provider, MD  aspirin 81 MG chewable tablet Chew 1 tablet (81 mg total) by mouth daily. 05/14/12   Orson Eva, MD  atorvastatin (LIPITOR) 20 MG tablet Take 1 tablet (20 mg total) by mouth daily at 6 PM. 05/14/12   Orson Eva, MD  baclofen (LIORESAL) 20 MG tablet Take 1 tablet (20 mg total) by mouth 2 (two) times daily. 03/16/15   Kathrynn Ducking, MD  citalopram (CELEXA) 20 MG tablet Take 30 mg by mouth daily.    Historical Provider, MD  gabapentin (NEURONTIN) 300 MG  capsule Take 300 mg by mouth 3 (three) times daily.    Historical Provider, MD  ibuprofen (ADVIL,MOTRIN) 200 MG tablet Take 200 mg by mouth every 6 (six) hours as needed.    Historical Provider, MD  LORazepam (ATIVAN) 1 MG tablet Take 1 mg by mouth every 8 (eight) hours.    Historical Provider, MD  metoprolol succinate (TOPROL-XL) 50 MG 24 hr tablet Take 50 mg by mouth daily. Take with or immediately following a meal.    Historical Provider, MD  oxyCODONE-acetaminophen (PERCOCET/ROXICET) 5-325 MG tablet Take 1 tablet by mouth every 4 (four) hours as needed for severe pain. 01/10/16   Janne Napoleon, NP  QUEtiapine (SEROQUEL XR) 150 MG 24 hr tablet Take 1 tablet (150 mg total) by mouth daily at 8 pm. Patient taking differently: Take 300 mg by mouth daily at 8 pm.  05/14/12   Orson Eva, MD  solifenacin (VESICARE) 5 MG tablet Take 5 mg by mouth daily.    Historical Provider, MD  topiramate (TOPAMAX) 100 MG tablet Take 100 mg by mouth 2 (two) times daily.    Historical Provider, MD   Meds Ordered and Administered this Visit   Medications  ketorolac (TORADOL) injection 60 mg (60 mg Intramuscular Given 01/10/16 1831)    BP 134/74 (BP Location: Left Arm)   Pulse 78   Temp 98.6 F (37 C) (Oral)   Resp 18   SpO2 99%  No data found.   Physical Exam  Constitutional: She is oriented to person, place, and time. She appears well-developed and well-nourished. No distress.  Neck: Normal range of motion. Neck supple.  Cardiovascular: Normal rate, regular rhythm and normal heart sounds.   Pulmonary/Chest: Effort normal and breath sounds normal.  Musculoskeletal:  Tenderness along the lumbosacral coccygeal areas as well as the paralumbar musculature. Patient is able to lean forward proximally 20 while sitting in this along with other movement exacerbates the pain. Lower extremity strength is 5 over 5 and symmetric. No spinal deformity, step-off deformity, swelling, discoloration. Tenderness over the area  lumbosacral musculature.  Neurological: She is alert and oriented to person, place, and time.  Skin: Skin is warm and dry. She is not diaphoretic.  Psychiatric: She has a normal mood and affect.  Nursing note and vitals reviewed.   Urgent Care Course   Clinical Course  Value Comment By Time  DG Sacrum/Coccyx (Reviewed) Janne Napoleon, NP 08/09 1858    Procedures (including critical care time)  Labs Review Labs Reviewed - No data to display  Imaging Review Dg Lumbar Spine Complete  Result Date: 01/10/2016 CLINICAL DATA:  Recent fall 2 weeks ago with persistent low back pain, initial encounter EXAM: LUMBAR SPINE - COMPLETE 4+ VIEW COMPARISON:  None. FINDINGS: Five lumbar type vertebral bodies are well visualized.  Vertebral body height is well maintained. No pars defects are seen. No anterolisthesis is noted. Minimal aortic calcifications are seen. IMPRESSION: No acute abnormality noted. Electronically Signed   By: Inez Catalina M.D.   On: 01/10/2016 18:49   Dg Sacrum/coccyx  Result Date: 01/10/2016 CLINICAL DATA:  Fall 2 weeks ago with buttock pain, initial encounter EXAM: SACRUM AND COCCYX - 2+ VIEW COMPARISON:  None. FINDINGS: Pelvic ring is intact. The sacral ala are unremarkable. No acute fracture is seen. No soft tissue changes are noted. IMPRESSION: No acute abnormality noted. Electronically Signed   By: Inez Catalina M.D.   On: 01/10/2016 18:50     Visual Acuity Review  Right Eye Distance:   Left Eye Distance:   Bilateral Distance:    Right Eye Near:   Left Eye Near:    Bilateral Near:         MDM   1. Fall against object, initial encounter   2. Sacral contusion, initial encounter   3. Lumbar strain, initial encounter   4. Muscle strain    Meds ordered this encounter  Medications  . ketorolac (TORADOL) injection 60 mg  . oxyCODONE-acetaminophen (PERCOCET/ROXICET) 5-325 MG tablet    Sig: Take 1 tablet by mouth every 4 (four) hours as needed for severe pain.     Dispense:  10 tablet    Refill:  0    Order Specific Question:   Supervising Provider    Answer:   Ihor Gully D [id number]  Heat and stretches as demo'd.     Janne Napoleon, NP 01/10/16 1909

## 2016-01-10 NOTE — ED Triage Notes (Signed)
Pt   States       She  Sat  Down  Hard   About  3   Weeks  Ago   And  Injured  Her   Lower   Back        She  Reports   She     Saw  Her   pcp       At  That  Time  And  Was  rx   meds  For  Same   And  They have  Not  releived  Her  Symptoms

## 2016-03-29 ENCOUNTER — Other Ambulatory Visit: Payer: Self-pay | Admitting: Oncology

## 2016-03-29 DIAGNOSIS — Z853 Personal history of malignant neoplasm of breast: Secondary | ICD-10-CM

## 2016-04-05 ENCOUNTER — Ambulatory Visit
Admission: RE | Admit: 2016-04-05 | Discharge: 2016-04-05 | Disposition: A | Discharge status: Home / Self Care | Payer: Medicaid Other | Source: Ambulatory Visit | Attending: Oncology | Admitting: Oncology

## 2016-04-05 DIAGNOSIS — Z853 Personal history of malignant neoplasm of breast: Secondary | ICD-10-CM

## 2016-06-04 NOTE — Progress Notes (Signed)
Patient Care Team: Nolene Ebbs, MD as PCP - General (Internal Medicine) Rolm Bookbinder, MD as Consulting Physician (General Surgery) Nicholas Lose, MD as Consulting Physician (Hematology and Oncology) Arloa Koh, MD as Consulting Physician (Radiation Oncology) Thea Silversmith, MD as Consulting Physician (Radiation Oncology) Sylvan Cheese, NP as Nurse Practitioner (Hematology and Oncology)  DIAGNOSIS:  Encounter Diagnosis  Name Primary?  . Malignant neoplasm of upper-outer quadrant of right breast in female, estrogen receptor positive ([place])     SUMMARY OF ONCOLOGIC HISTORY:   [name] ([place])   03/20/2015 Mammogram    Right breast mass upper outer quadrant 4-5 mm focus, ultrasound axilla normal-appearing lymph nodes      04/05/2015 Initial Diagnosis    Right breast biopsy 10:00 position: 8 cm from the nipple, invasive ductal carcinoma grade 1, ER/PR positive, HER-2 negative, ratio 1.14, Ki-67 5%      04/05/2015 Clinical Stage    Stage IA: T1a N0      05/12/2015 Surgery    Right lumpectomy: Invasive ductal carcinoma grade 1, 0.9 cm, ADH, margins negative, 0/1 lymph nodes, ER 100%, PR 90%, HER-2 negative ratio 1.1, Ki-67 5%      05/12/2015 Pathologic Stage    Stage IA: T1b N0      05/12/2015 Oncotype testing    RS 20 (13% ROR)      07/03/2015 - 08/16/2015 Radiation Therapy    Adjuvant radiation therapy by Dr. Pablo Ledger: Right breast / 45 Pearline Cables @ 1.8 Pearline Cables per fraction x 25 fractions; right breast boost / 16 Gray at [employer] per fraction x 8 fractions      09/25/2015 -  Anti-estrogen oral therapy    Anastrozole 1 mg daily      10/17/2015 Survivorship    Survivorship visit completed and copy of care plan given to patient       CHIEF COMPLIANT: Follow-up on anastrozole  INTERVAL HISTORY: April [name] is a 51 year old with above-mentioned history of right breast cancer treated with lumpectomy and adjuvant  radiation is currently on anastrozole therapy. She appears to be tolerating it fairly well. She does have occasional hot flashes and musculoskeletal aches and pains She has been feeling quite well ever since she switched the anastrozole from evening to morning.  REVIEW OF SYSTEMS:   Constitutional: Denies fevers, chills or abnormal weight loss Eyes: Denies blurriness of vision Ears, nose, mouth, throat, and face: Denies mucositis or sore throat Respiratory: Denies cough, dyspnea or wheezes Cardiovascular: Denies palpitation, chest discomfort Gastrointestinal:  Denies nausea, heartburn or change in bowel habits Skin: Denies abnormal skin rashes Lymphatics: Denies new lymphadenopathy or easy bruising Neurological:Denies numbness, tingling or new weaknesses Behavioral/Psych: Mood is stable, no new changes  Extremities: No lower extremity edema Breast:  denies any pain or lumps or nodules in either breasts All other systems were reviewed with the patient and are negative.  I have reviewed the past medical history, past surgical history, social history and family history with the patient and they are unchanged from previous note.  ALLERGIES:  is allergic to morphine and related and hydrocodone.  MEDICATIONS:  Current Outpatient Prescriptions  Medication Sig Dispense Refill  . anastrozole (ARIMIDEX) 1 MG tablet TAKE 1 TABLET BY MOUTH DAILY 30 tablet 5  . ARIPiprazole (ABILIFY) 20 MG tablet Take 20 mg by mouth daily.    Marland Kitchen aspirin 81 MG chewable tablet Chew 1 tablet (81 mg total) by mouth daily.    Marland Kitchen atorvastatin (LIPITOR) 20  MG tablet Take 1 tablet (20 mg total) by mouth daily at 6 PM. 30 tablet 2  . baclofen (LIORESAL) 20 MG tablet Take 1 tablet (20 mg total) by mouth 2 (two) times daily.    . citalopram (CELEXA) 20 MG tablet Take 30 mg by mouth daily.    Marland Kitchen gabapentin (NEURONTIN) 300 MG capsule Take 300 mg by mouth 3 (three) times daily.    Marland Kitchen ibuprofen (ADVIL,MOTRIN) 200 MG tablet Take 200  mg by mouth every 6 (six) hours as needed.    Marland Kitchen LORazepam (ATIVAN) 1 MG tablet Take 1 mg by mouth every 8 (eight) hours.    . metoprolol succinate (TOPROL-XL) 50 MG 24 hr tablet Take 50 mg by mouth daily. Take with or immediately following a meal.    . oxyCODONE-acetaminophen (PERCOCET/ROXICET) 5-325 MG tablet Take 1 tablet by mouth every 4 (four) hours as needed for severe pain. 10 tablet 0  . QUEtiapine (SEROQUEL XR) 150 MG 24 hr tablet Take 1 tablet (150 mg total) by mouth daily at 8 pm. (Patient taking differently: Take 300 mg by mouth daily at 8 pm. ) 30 each 2  . solifenacin (VESICARE) 5 MG tablet Take 5 mg by mouth daily.    Marland Kitchen topiramate (TOPAMAX) 100 MG tablet Take 100 mg by mouth 2 (two) times daily.     No current facility-administered medications for this visit.     PHYSICAL EXAMINATION: ECOG PERFORMANCE STATUS: 1 - Symptomatic but completely ambulatory  Vitals:   06/05/16 1345  BP: 127/65  Pulse: 75  Resp: 18  Temp: 97.6 F (36.4 C)   Filed Weights   06/05/16 1345  Weight: 220 lb 9.6 oz (100.1 kg)    GENERAL:alert, no distress and comfortable SKIN: skin color, texture, turgor are normal, no rashes or significant lesions EYES: normal, Conjunctiva are pink and non-injected, sclera clear OROPHARYNX:no exudate, no erythema and lips, buccal mucosa, and tongue normal  NECK: supple, thyroid normal size, non-tender, without nodularity LYMPH:  no palpable lymphadenopathy in the cervical, axillary or inguinal LUNGS: clear to auscultation and percussion with normal breathing effort HEART: regular rate & rhythm and no murmurs and no lower extremity edema ABDOMEN:abdomen soft, non-tender and normal bowel sounds MUSCULOSKELETAL:no cyanosis of digits and no clubbing  NEURO: alert & oriented x 3 with fluent speech, no focal motor/sensory deficits EXTREMITIES: No lower extremity edema   LABORATORY DATA:  I have reviewed the data as listed   Chemistry      Component Value  Date/Time   NA 138 12/06/2015 0920   K 4.2 12/06/2015 0920   CL 97 05/11/2013 2045   CO2 22 12/06/2015 0920   BUN 12.2 12/06/2015 0920   CREATININE 1.0 12/06/2015 0920      Component Value Date/Time   CALCIUM 9.2 12/06/2015 0920   ALKPHOS 135 12/06/2015 0920   AST 16 12/06/2015 0920   ALT 22 12/06/2015 0920   BILITOT 0.36 12/06/2015 0920       Lab Results  Component Value Date   WBC 7.8 12/06/2015   HGB 14.4 12/06/2015   HCT 43.5 12/06/2015   MCV 91.6 12/06/2015   PLT 249 12/06/2015   NEUTROABS 4.1 12/06/2015    ASSESSMENT & PLAN:  [name] ([place]) Right lumpectomy 05/12/2015: Invasive ductal carcinoma grade 1, 0.9 cm, ADH, margins negative, 0/1 lymph nodes, T1BN0 stage IA pathologic stage, ER 100%, PR 90%, HER-2 negative ratio 1.1, Ki-67 5% Oncotype DX score 20, 13%  risk of recurrence, intermediate risk Completed radiation therapy 08/16/2015 Estradiol: < 5; FSH: 110 Menopausal  Current Treatment: Anastrozole 1 mg daily started 09/25/15  Anastrozole Toxicities: 1. Occasional hot flashes but they were present even prior to starting the treatment 2. Muscular skeletal aches and pains also present prior to starting anastrozole. I do not believe these are related to anastrozole therapy.  Breast Cancer Surveillance: 1. Breast exam July 2017: Normal 2. Mammogram November 2017 No abnormalities. Postsurgical changes. Breast Density Category B. I recommended that she get 3-D mammograms for surveillance. Discussed the differences between different breast density categories.   Patient plans to join [employer] program.  RTC in 6 months for follow up and then we can see her annually   No orders of the defined types were placed in this encounter.  The patient has a good understanding of the overall plan. she agrees with it. she will call with any problems that may develop before the next visit here.   Rulon Eisenmenger,  MD 06/05/16

## 2016-06-04 NOTE — Assessment & Plan Note (Signed)
Right lumpectomy 05/12/2015: Invasive ductal carcinoma grade 1, 0.9 cm, ADH, margins negative, 0/1 lymph nodes, T1BN0 stage IA pathologic stage, ER 100%, PR 90%, HER-2 negative ratio 1.1, Ki-67 5% Oncotype DX score 20, 13% risk of recurrence, intermediate risk Completed radiation therapy 08/16/2015 Estradiol: < 5; FSH: 110 Menopausal  Current Treatment: Anastrozole 1 mg daily started 09/25/15  Anastrozole Toxicities: 1. Occasional hot flashes but they were present even prior to starting the treatment 2. Muscular skeletal aches and pains also present prior to starting anastrozole  I provided the patient information for the [employer] program.  RTC in 6 months for follow up and then we can see her annually

## 2016-06-05 ENCOUNTER — Ambulatory Visit (HOSPITAL_BASED_OUTPATIENT_CLINIC_OR_DEPARTMENT_OTHER): Payer: Medicaid Other | Admitting: Hematology and Oncology

## 2016-06-05 ENCOUNTER — Encounter: Payer: Self-pay | Admitting: Hematology and Oncology

## 2016-06-05 DIAGNOSIS — Z170 Estrogen receptor positive status [ER+]: Secondary | ICD-10-CM

## 2016-06-05 DIAGNOSIS — C50411 Malignant neoplasm of upper-outer quadrant of right female breast: Secondary | ICD-10-CM | POA: Diagnosis present

## 2016-08-05 ENCOUNTER — Other Ambulatory Visit: Payer: Self-pay | Admitting: *Deleted

## 2016-08-05 DIAGNOSIS — C50411 Malignant neoplasm of upper-outer quadrant of right female breast: Secondary | ICD-10-CM

## 2016-08-05 MED ORDER — ANASTROZOLE 1 MG PO TABS: 1.0000 mg | ORAL_TABLET | Freq: Every day | ORAL | 3 refills | Status: DC

## 2016-10-22 ENCOUNTER — Encounter: Payer: Self-pay | Admitting: Gastroenterology

## 2016-11-04 ENCOUNTER — Ambulatory Visit (AMBULATORY_SURGERY_CENTER): Payer: Self-pay | Admitting: *Deleted

## 2016-11-04 VITALS — Ht 63.0 in | Wt 216.0 lb

## 2016-11-04 DIAGNOSIS — Z1211 Encounter for screening for malignant neoplasm of colon: Secondary | ICD-10-CM

## 2016-11-04 MED ORDER — NA SULFATE-K SULFATE-MG SULF 17.5-3.13-1.6 GM/177ML PO SOLN: 1.0000 | Freq: Once | ORAL | 0 refills | Status: AC

## 2016-11-04 NOTE — Progress Notes (Signed)
No egg or soy allergy known to patient  No issues with past sedation with any surgeries  or procedures, no intubation problems  No diet pills per patient No home 02 use per patient  No blood thinners per patient  Pt denies issues with constipation  No A fib or A flutter  EMMI video sent to pt's e mail  last seizure was 08-27-2014

## 2016-11-18 ENCOUNTER — Encounter: Payer: Self-pay | Admitting: Gastroenterology

## 2016-11-18 ENCOUNTER — Ambulatory Visit (AMBULATORY_SURGERY_CENTER): Payer: Medicaid Other | Admitting: Gastroenterology

## 2016-11-18 VITALS — BP 123/66 | HR 75 | Temp 98.9°F | Resp 20 | Ht 63.0 in | Wt 216.0 lb

## 2016-11-18 DIAGNOSIS — Z1211 Encounter for screening for malignant neoplasm of colon: Secondary | ICD-10-CM

## 2016-11-18 DIAGNOSIS — D120 Benign neoplasm of cecum: Secondary | ICD-10-CM | POA: Diagnosis not present

## 2016-11-18 DIAGNOSIS — D126 Benign neoplasm of colon, unspecified: Secondary | ICD-10-CM | POA: Diagnosis not present

## 2016-11-18 DIAGNOSIS — K635 Polyp of colon: Secondary | ICD-10-CM

## 2016-11-18 DIAGNOSIS — D123 Benign neoplasm of transverse colon: Secondary | ICD-10-CM

## 2016-11-18 DIAGNOSIS — D129 Benign neoplasm of anus and anal canal: Secondary | ICD-10-CM

## 2016-11-18 DIAGNOSIS — D127 Benign neoplasm of rectosigmoid junction: Secondary | ICD-10-CM

## 2016-11-18 DIAGNOSIS — K6389 Other specified diseases of intestine: Secondary | ICD-10-CM | POA: Diagnosis not present

## 2016-11-18 DIAGNOSIS — D124 Benign neoplasm of descending colon: Secondary | ICD-10-CM | POA: Diagnosis not present

## 2016-11-18 DIAGNOSIS — Z1212 Encounter for screening for malignant neoplasm of rectum: Secondary | ICD-10-CM | POA: Diagnosis not present

## 2016-11-18 DIAGNOSIS — D125 Benign neoplasm of sigmoid colon: Secondary | ICD-10-CM

## 2016-11-18 DIAGNOSIS — D128 Benign neoplasm of rectum: Secondary | ICD-10-CM

## 2016-11-18 MED ORDER — SODIUM CHLORIDE 0.9 % IV SOLN: 500.0000 mL | INTRAVENOUS | Status: DC

## 2016-11-18 NOTE — Patient Instructions (Signed)
NO ASPIRIN, ASPIRIN CONTAINING PRODUCTS (BC OR GOODY POWDERS) OR NSAIDS (IBUPROFEN, ADVIL, ALEVE, AND MOTRIN) FOR 2 weeks after polyp removal; TYLENOL IS OK TO TAKE   Information on polyps and hemorrhoids given to you  Await pathology results   YOU HAD AN ENDOSCOPIC PROCEDURE TODAY AT [facility]:   Refer to the procedure report that was given to you for any specific questions about what was found during the examination.  If the procedure report does not answer your questions, please call your gastroenterologist to clarify.  If you requested that your care partner not be given the details of your procedure findings, then the procedure report has been included in a sealed envelope for you to review at your convenience later.  YOU SHOULD EXPECT: Some feelings of bloating in the abdomen. Passage of more gas than usual.  Walking can help get rid of the air that was put into your GI tract during the procedure and reduce the bloating. If you had a lower endoscopy (such as a colonoscopy or flexible sigmoidoscopy) you may notice spotting of blood in your stool or on the toilet paper. If you underwent a bowel prep for your procedure, you may not have a normal bowel movement for a few days.  Please Note:  You might notice some irritation and congestion in your nose or some drainage.  This is from the oxygen used during your procedure.  There is no need for concern and it should clear up in a day or so.  SYMPTOMS TO REPORT IMMEDIATELY:   Following lower endoscopy (colonoscopy or flexible sigmoidoscopy):  Excessive amounts of blood in the stool  Significant tenderness or worsening of abdominal pains  Swelling of the abdomen that is new, acute  Fever of 100F or higher    For urgent or emergent issues, a gastroenterologist can be reached at any hour by calling [phone].   DIET:  We do recommend a small meal at first, but then you may proceed to your regular diet.  Drink plenty of  fluids but you should avoid alcoholic beverages for 24 hours.  ACTIVITY:  You should plan to take it easy for the rest of today and you should NOT DRIVE or use heavy machinery until tomorrow (because of the sedation medicines used during the test).    FOLLOW UP: Our staff will call the number listed on your records the next business day following your procedure to check on you and address any questions or concerns that you may have regarding the information given to you following your procedure. If we do not reach you, we will leave a message.  However, if you are feeling well and you are not experiencing any problems, there is no need to return our call.  We will assume that you have returned to your regular daily activities without incident.  If any biopsies were taken you will be contacted by phone or by letter within the next 1-3 weeks.  Please call us at [phone] if you have not heard about the biopsies in 3 weeks.    SIGNATURES/CONFIDENTIALITY: You and/or your care partner have signed paperwork which will be entered into your electronic medical record.  These signatures attest to the fact that that the information above on your After Visit Summary has been reviewed and is understood.  Full responsibility of the confidentiality of this discharge information lies with you and/or your care-partner.

## 2016-11-18 NOTE — Progress Notes (Signed)
Called to room to assist during endoscopic procedure.  Patient ID and intended procedure confirmed with present staff. Received instructions for my participation in the procedure from the performing physician.  

## 2016-11-18 NOTE — Op Note (Addendum)
[place] Patient Name: April [name] Procedure Date: 11/18/2016 1:54 PM MRN: [id number] Endoscopist: Remo Lipps P. Kalese Ensz MD, MD Age: 51 Referring MD:  Date of Birth: May 27, 1966 Gender: Female Account #: [id number] Procedure:                Colonoscopy Indications:              Screening for colorectal malignant neoplasm Medicines:                Monitored Anesthesia Care Procedure:                Pre-Anesthesia Assessment:                           - Prior to the procedure, a History and Physical                            was performed, and patient medications and                            allergies were reviewed. The patient's tolerance of                            previous anesthesia was also reviewed. The risks                            and benefits of the procedure and the sedation                            options and risks were discussed with the patient.                            All questions were answered, and informed consent                            was obtained. Prior Anticoagulants: The patient has                            taken aspirin, last dose was 1 day prior to                            procedure. ASA Grade Assessment: II - A patient                            with mild systemic disease. After reviewing the                            risks and benefits, the patient was deemed in                            satisfactory condition to undergo the procedure.                           After obtaining informed consent, the colonoscope  was passed under direct vision. Throughout the                            procedure, the patient's blood pressure, pulse, and                            oxygen saturations were monitored continuously. The                            Colonoscope was introduced through the anus and                            advanced to the the cecum, identified by                            appendiceal orifice  and ileocecal valve. The                            colonoscopy was performed without difficulty. The                            patient tolerated the procedure well. The quality                            of the bowel preparation was adequate. The                            ileocecal valve, appendiceal orifice, and rectum                            were photographed. Scope In: 2:04:27 PM Scope Out: 2:38:28 PM Scope Withdrawal Time: 0 hours 30 minutes 24 seconds  Total Procedure Duration: 0 hours 34 minutes 1 second  Findings:                 The perianal and digital rectal examinations were                            normal.                           Two sessile polyps were found in the cecum. The                            polyps were 3 to 5 mm in size. These polyps were                            removed with a cold snare. Resection and retrieval                            were complete.                           A small polyp versus lymphoid aggregate was found  arising from the appendiceal orifice. The polyp was                            sessile and clear margins could not be seen for                            polypectomy. Biopsies were taken with a cold                            forceps for histology.                           A 4 mm polyp was found in the transverse colon. The                            polyp was sessile. The polyp was removed with a                            cold snare. Resection and retrieval were complete.                           A 4 mm polyp was found in the descending colon. The                            polyp was sessile. The polyp was removed with a                            cold snare. Resection and retrieval were complete.                           Two sessile polyps were found in the sigmoid colon.                            The polyps were 3 to 4 mm in size. These polyps                            were removed with a  cold snare. Resection and                            retrieval were complete.                           A 10 mm polyp was found in the recto-sigmoid colon.                            The polyp was pedunculated. The polyp was removed                            with a hot snare. Resection and retrieval were                            complete.  A 6 mm polyp was found in the rectum. The polyp was                            semi-pedunculated. The polyp was removed with a hot                            snare. Resection and retrieval were complete.                           Internal hemorrhoids were found during retroflexion.                           The bowel prep was initial fair, extensive lavage                            was performed to obtain adequate views. The exam                            was otherwise without abnormality. Complications:            No immediate complications. Estimated blood loss:                            Minimal. Estimated Blood Loss:     Estimated blood loss was minimal. Impression:               - Two 3 to 5 mm polyps in the cecum, removed with a                            cold snare. Resected and retrieved.                           - One small polyp at the appendiceal orifice.                            Biopsied.                           - One 4 mm polyp in the transverse colon, removed                            with a cold snare. Resected and retrieved.                           - One 4 mm polyp in the descending colon, removed                            with a cold snare. Resected and retrieved.                           - Two 3 to 4 mm polyps in the sigmoid colon,                            removed with a cold  snare. Resected and retrieved.                           - One 10 mm polyp at the recto-sigmoid colon,                            removed with a hot snare. Resected and retrieved.                           - One 6 mm polyp  in the rectum, removed with a hot                            snare. Resected and retrieved.                           - Internal hemorrhoids.                           - The examination was otherwise normal. Recommendation:           - Patient has a contact number available for                            emergencies. The signs and symptoms of potential                            delayed complications were discussed with the                            patient. Return to normal activities tomorrow.                            Written discharge instructions were provided to the                            patient.                           - Resume previous diet.                           - Continue present medications.                           - Await pathology results.                           - Repeat colonoscopy is recommended for                            surveillance. The colonoscopy date will be                            determined after pathology results from today's  exam become available for review.                           - No ibuprofen, naproxen, or other non-steroidal                            anti-inflammatory drugs for 2 weeks after polyp                            removal. Remo Lipps P. Marianna Cid MD, MD 11/18/2016 2:46:21 PM This report has been signed electronically.

## 2016-11-18 NOTE — Progress Notes (Signed)
Pt's states no medical or surgical changes since previsit or office visit.Pt's states no medical or surgical changes since previsit or office visit. 

## 2016-11-18 NOTE — Progress Notes (Signed)
A and O x3. Report to RN. Tolerated MAC anesthesia well.

## 2016-11-19 ENCOUNTER — Telehealth: Payer: Self-pay

## 2016-11-19 NOTE — Telephone Encounter (Signed)
  Follow up Call-  Call back number 11/18/2016  Post procedure Call Back phone  # [phone]  Permission to leave phone message Yes  Some recent data might be hidden     Patient questions:  Do you have a fever, pain , or abdominal swelling? No. Pain Score  0 *  Have you tolerated food without any problems? Yes.    Have you been able to return to your normal activities? Yes.    Do you have any questions about your discharge instructions: Diet   No. Medications  No. Follow up visit  No.  Do you have questions or concerns about your Care? No.  Actions: * If pain score is 4 or above: No action needed, pain <4.

## 2016-11-23 ENCOUNTER — Encounter: Payer: Self-pay | Admitting: Gastroenterology

## 2016-12-10 ENCOUNTER — Ambulatory Visit: Payer: Self-pay | Admitting: Hematology and Oncology

## 2016-12-10 NOTE — Assessment & Plan Note (Deleted)
Right lumpectomy 05/12/2015: Invasive ductal carcinoma grade 1, 0.9 cm, ADH, margins negative, 0/1 lymph nodes, T1BN0 stage IA pathologic stage, ER 100%, PR 90%, HER-2 negative ratio 1.1, Ki-67 5% Oncotype DX score 20, 13% risk of recurrence, intermediate risk Completed radiation therapy 08/16/2015 Estradiol: < 5; FSH: 110 Menopausal  Current Treatment: Anastrozole 1 mg daily started 09/25/15  Anastrozole Toxicities: 1. Occasional hot flashes but they were present even prior to starting the treatment 2. Muscular skeletal aches and pains also present prior to starting anastrozole. I do not believe these are related to anastrozole therapy.  Breast Cancer Surveillance: 1. Breast exam 12/10/2016: Normal 2. Mammogram November 2017 No abnormalities. Postsurgical changes. Breast Density Category B.   Patient plans to join [employer].  RTC in one year for follow-up

## 2016-12-18 ENCOUNTER — Telehealth: Payer: Self-pay

## 2016-12-18 ENCOUNTER — Other Ambulatory Visit: Payer: Self-pay | Admitting: Hematology and Oncology

## 2016-12-18 ENCOUNTER — Encounter: Payer: Self-pay | Admitting: Hematology and Oncology

## 2016-12-18 ENCOUNTER — Ambulatory Visit (HOSPITAL_BASED_OUTPATIENT_CLINIC_OR_DEPARTMENT_OTHER): Payer: Medicaid Other | Admitting: Hematology and Oncology

## 2016-12-18 DIAGNOSIS — C50411 Malignant neoplasm of upper-outer quadrant of right female breast: Secondary | ICD-10-CM | POA: Diagnosis present

## 2016-12-18 DIAGNOSIS — Z170 Estrogen receptor positive status [ER+]: Secondary | ICD-10-CM

## 2016-12-18 DIAGNOSIS — N951 Menopausal and female climacteric states: Secondary | ICD-10-CM

## 2016-12-18 DIAGNOSIS — M2550 Pain in unspecified joint: Secondary | ICD-10-CM

## 2016-12-18 MED ORDER — QUETIAPINE FUMARATE ER 150 MG PO TB24: 300.0000 mg | ORAL_TABLET | Freq: Every day | ORAL | Status: DC

## 2016-12-18 MED ORDER — CITALOPRAM HYDROBROMIDE 20 MG PO TABS: 20.0000 mg | ORAL_TABLET | Freq: Every day | ORAL | Status: DC

## 2016-12-18 NOTE — Assessment & Plan Note (Signed)
Right lumpectomy 05/12/2015: Invasive ductal carcinoma grade 1, 0.9 cm, ADH, margins negative, 0/1 lymph nodes, T1BN0 stage IA pathologic stage, ER 100%, PR 90%, HER-2 negative ratio 1.1, Ki-67 5% Oncotype DX score 20, 13% risk of recurrence, intermediate risk Completed radiation therapy 08/16/2015 Estradiol: < 5; FSH: 110 Menopausal  Current Treatment: Anastrozole 1 mg daily started 09/25/15  Anastrozole Toxicities: 1. Occasional hot flashes but they were present even prior to starting the treatment 2. Muscular skeletal aches and pains also present prior to starting anastrozole. I do not believe these are related to anastrozole therapy.  Breast Cancer Surveillance: 1. Breast exam July 2018: Normal 2. Mammogram November 2017 No abnormalities. Postsurgical changes. Breast Density Category B. I recommended that she get 3-D mammograms for surveillance.   Patient plans to join [employer].  RTC in one year for follow-up

## 2016-12-18 NOTE — Progress Notes (Signed)
Patient Care Team: Nolene Ebbs, MD as PCP - General (Internal Medicine) Rolm Bookbinder, MD as Consulting Physician (General Surgery) Nicholas Lose, MD as Consulting Physician (Hematology and Oncology) Arloa Koh, MD as Consulting Physician (Radiation Oncology) Thea Silversmith, MD (Inactive) as Consulting Physician (Radiation Oncology) Sylvan Cheese, NP as Nurse Practitioner (Hematology and Oncology)  DIAGNOSIS:  Encounter Diagnosis  Name Primary?  . Malignant neoplasm of upper-outer quadrant of right breast in female, estrogen receptor positive ([place])     SUMMARY OF ONCOLOGIC HISTORY:   [name] ([place])   03/20/2015 Mammogram    Right breast mass upper outer quadrant 4-5 mm focus, ultrasound axilla normal-appearing lymph nodes      04/05/2015 Initial Diagnosis    Right breast biopsy 10:00 position: 8 cm from the nipple, invasive ductal carcinoma grade 1, ER/PR positive, HER-2 negative, ratio 1.14, Ki-67 5%      04/05/2015 Clinical Stage    Stage IA: T1a N0      05/12/2015 Surgery    Right lumpectomy: Invasive ductal carcinoma grade 1, 0.9 cm, ADH, margins negative, 0/1 lymph nodes, ER 100%, PR 90%, HER-2 negative ratio 1.1, Ki-67 5%      05/12/2015 Pathologic Stage    Stage IA: T1b N0      05/12/2015 Oncotype testing    RS 20 (13% ROR)      07/03/2015 - 08/16/2015 Radiation Therapy    Adjuvant radiation therapy by Dr. Pablo Ledger: Right breast / 45 [place] Cables @ 1.8 Pear[place] Cables per fraction x 25 fractions; right breast boost / 16 [place] at [employer] per fraction x 8 fractions      09/25/2015 -  Anti-estrogen oral therapy    Anastrozole 1 mg daily      10/17/2015 Survivorship    Survivorship visit completed and copy of care plan given to patient       CHIEF COMPLIANT: Follow-up on anastrozole therapy complaining of a knot in the right axilla  INTERVAL HISTORY: April [name] is a 51 year old with above-mentioned  history of right breast cancer underwent lumpectomy followed by adjuvant radiation and is currently on anastrozole therapy. She is tolerating it fairly well. She does have occasional hot flashes and arthralgias but she's had those even prior to starting the treatment. She is complaining of a knot underneath the right axilla.  REVIEW OF SYSTEMS:   Constitutional: Denies fevers, chills or abnormal weight loss Eyes: Denies blurriness of vision Ears, nose, mouth, throat, and face: Denies mucositis or sore throat Respiratory: Denies cough, dyspnea or wheezes Cardiovascular: Denies palpitation, chest discomfort Gastrointestinal:  Denies nausea, heartburn or change in bowel habits Skin: Denies abnormal skin rashes Lymphatics: Denies new lymphadenopathy or easy bruising Neurological:Denies numbness, tingling or new weaknesses Behavioral/Psych: Mood is stable, no new changes  Extremities: No lower extremity edema Breast: Tenderness in the right breast and knot underneath the right axilla All other systems were reviewed with the patient and are negative.  I have reviewed the past medical history, past surgical history, social history and family history with the patient and they are unchanged from previous note.  ALLERGIES:  is allergic to morphine and related and hydrocodone.  MEDICATIONS:  Current Outpatient Prescriptions  Medication Sig Dispense Refill  . anastrozole (ARIMIDEX) 1 MG tablet Take 1 tablet (1 mg total) by mouth daily. 90 tablet 3  . ARIPiprazole (ABILIFY) 20 MG tablet Take 20 mg by mouth daily.    Marland Kitchen aspirin 81 MG chewable tablet Chew 1 tablet (  81 mg total) by mouth daily.    Marland Kitchen atorvastatin (LIPITOR) 20 MG tablet Take 1 tablet (20 mg total) by mouth daily at 6 PM. 30 tablet 2  . baclofen (LIORESAL) 20 MG tablet Take 1 tablet (20 mg total) by mouth 2 (two) times daily.    . citalopram (CELEXA) 20 MG tablet Take 30 mg by mouth daily.    Marland Kitchen gabapentin (NEURONTIN) 300 MG capsule Take  300 mg by mouth 3 (three) times daily.    Marland Kitchen ibuprofen (ADVIL,MOTRIN) 200 MG tablet Take 200 mg by mouth every 6 (six) hours as needed.    Marland Kitchen LORazepam (ATIVAN) 1 MG tablet Take 1 mg by mouth every 8 (eight) hours.    . metoprolol succinate (TOPROL-XL) 50 MG 24 hr tablet Take 50 mg by mouth daily. Take with or immediately following a meal.    . QUEtiapine (SEROQUEL XR) 150 MG 24 hr tablet Take 1 tablet (150 mg total) by mouth daily at 8 pm. (Patient taking differently: Take 300 mg by mouth daily at 8 pm. ) 30 each 2  . solifenacin (VESICARE) 5 MG tablet Take 5 mg by mouth daily.    Marland Kitchen topiramate (TOPAMAX) 100 MG tablet Take 100 mg by mouth 2 (two) times daily.     Current Facility-Administered Medications  Medication Dose Route Frequency Provider Last Rate Last Dose  . 0.9 %  sodium chloride infusion  500 mL Intravenous Continuous Armbruster, Renelda Loma, MD        PHYSICAL EXAMINATION: ECOG PERFORMANCE STATUS: 1 - Symptomatic but completely ambulatory  Vitals:   12/18/16 1028  BP: (!) 143/87  Pulse: (!) 101  Resp: 18  Temp: 98.4 F (36.9 C)   Filed Weights   12/18/16 1028  Weight: 213 lb 4.8 oz (96.8 kg)    GENERAL:alert, no distress and comfortable SKIN: skin color, texture, turgor are normal, no rashes or significant lesions EYES: normal, Conjunctiva are pink and non-injected, sclera clear OROPHARYNX:no exudate, no erythema and lips, buccal mucosa, and tongue normal  NECK: supple, thyroid normal size, non-tender, without nodularity LYMPH:  no palpable lymphadenopathy in the cervical, axillary or inguinal LUNGS: clear to auscultation and percussion with normal breathing effort HEART: regular rate & rhythm and no murmurs and no lower extremity edema ABDOMEN:abdomen soft, non-tender and normal bowel sounds MUSCULOSKELETAL:no cyanosis of digits and no clubbing  NEURO: alert & oriented x 3 with fluent speech, no focal motor/sensory deficits EXTREMITIES: No lower extremity  edema BREAST:Palpable lump in the right axilla tenderness along the surgical incision and along the radiation discoloration. No palpable axillary supraclavicular or infraclavicular adenopathy. (exam performed in the presence of a chaperone)  LABORATORY DATA:  I have reviewed the data as listed   Chemistry      Component Value Date/Time   NA 138 12/06/2015 0920   K 4.2 12/06/2015 0920   CL 97 05/11/2013 2045   CO2 22 12/06/2015 0920   BUN 12.2 12/06/2015 0920   CREATININE 1.0 12/06/2015 0920      Component Value Date/Time   CALCIUM 9.2 12/06/2015 0920   ALKPHOS 135 12/06/2015 0920   AST 16 12/06/2015 0920   ALT 22 12/06/2015 0920   BILITOT 0.36 12/06/2015 0920       Lab Results  Component Value Date   WBC 7.8 12/06/2015   HGB 14.4 12/06/2015   HCT 43.5 12/06/2015   MCV 91.6 12/06/2015   PLT 249 12/06/2015   NEUTROABS 4.1 12/06/2015    ASSESSMENT & PLAN:  [name] ([place]) Right lumpectomy 05/12/2015: Invasive ductal carcinoma grade 1, 0.9 cm, ADH, margins negative, 0/1 lymph nodes, T1BN0 stage IA pathologic stage, ER 100%, PR 90%, HER-2 negative ratio 1.1, Ki-67 5% Oncotype DX score 20, 13% risk of recurrence, intermediate risk Completed radiation therapy 08/16/2015 Estradiol: < 5; FSH: 110 Menopausal  Current Treatment: Anastrozole 1 mg daily started 09/25/15  Anastrozole Toxicities: 1. Occasional hot flashes but they were present even prior to starting the treatment 2. Muscular skeletal aches and pains also present prior to starting anastrozole. I do not believe these are related to anastrozole therapy.  Breast Cancer Surveillance: 1. Breast exam July 2018: Normal 2. Mammogram November 2017 No abnormalities. Postsurgical changes. Breast Density Category B. I recommended that she get 3-D mammograms for surveillance.   Patient joined  [employer] and walks 3 times a week.  Palpable lump in the right  axilla: I'm sending her for ultrasound of the right axilla. It could be a cyst or fat necrosis or scar tissue. I certainly want to make sure that there is no concern for recurrent disease. If the ultrasound plus/minus biopsy is benign that I will see her back in one year  RTC in one year for follow-up  I spent 25 minutes talking to the patient of which more than half was spent in counseling and coordination of care.  No orders of the defined types were placed in this encounter.  The patient has a good understanding of the overall plan. she agrees with it. she will call with any problems that may develop before the next visit here.   Rulon Eisenmenger, MD 12/18/16

## 2016-12-18 NOTE — Telephone Encounter (Signed)
appts made and avs printed for patient 

## 2016-12-20 ENCOUNTER — Ambulatory Visit
Admission: RE | Admit: 2016-12-20 | Discharge: 2016-12-20 | Disposition: A | Discharge status: Home / Self Care | Payer: Medicaid Other | Source: Ambulatory Visit | Attending: Hematology and Oncology | Admitting: Hematology and Oncology

## 2016-12-20 DIAGNOSIS — C50411 Malignant neoplasm of upper-outer quadrant of right female breast: Secondary | ICD-10-CM

## 2016-12-20 DIAGNOSIS — Z170 Estrogen receptor positive status [ER+]: Principal | ICD-10-CM

## 2016-12-20 HISTORY — DX: Personal history of irradiation: Z92.3

## 2017-01-25 IMAGING — MG 2D DIGITAL DIAGNOSTIC BILATERAL MAMMOGRAM WITH CAD AND ADJUNCT T
8 of 13 series · 8 of 29 positions shown · non-contrast
Comparison: Previous exam(s).

CLINICAL DATA: History of right breast cancer in 6833 status post
lumpectomy and radiation therapy.Additional benign ultrasound-guided
left breast biopsy in 4111.

EXAM:
2D DIGITAL DIAGNOSTIC BILATERAL MAMMOGRAM WITH CAD AND ADJUNCT TOMO

[R MLO (1 of 2)]
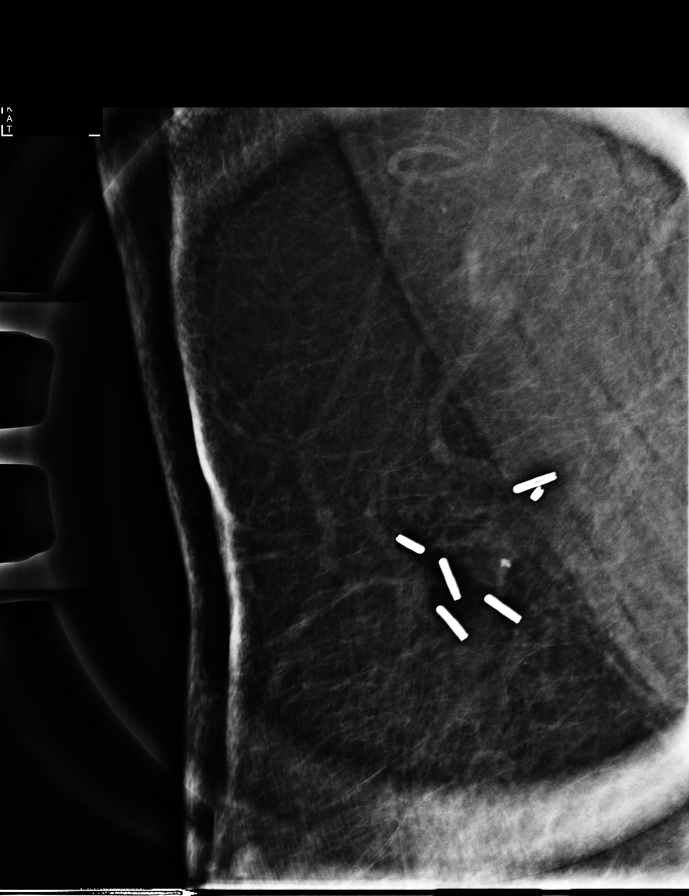

[L MLO synth-2D]
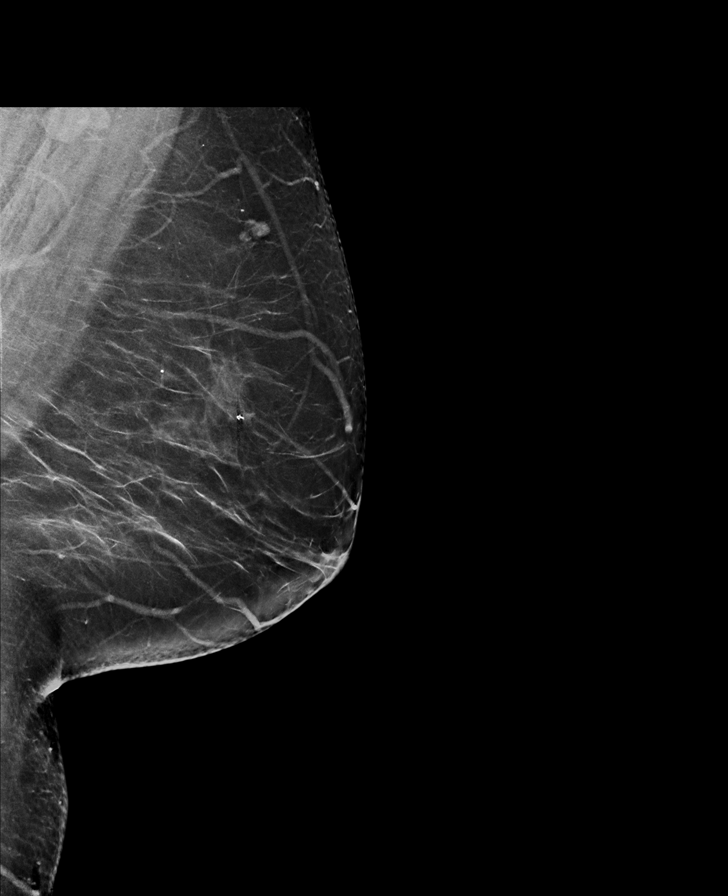

[L CC synth-2D]
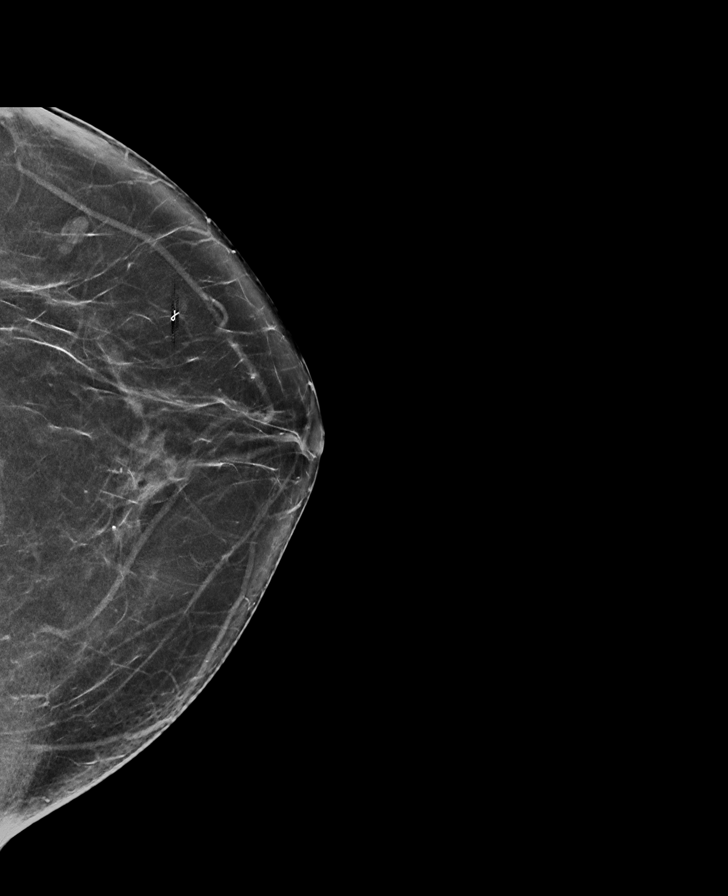

[L CC]
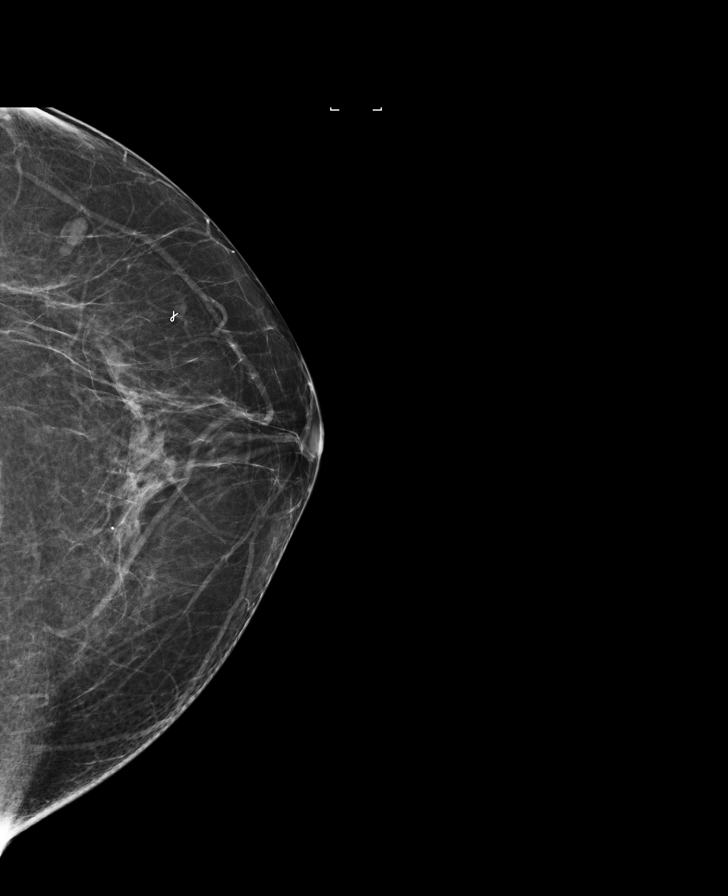

[R MLO (2 of 2)]
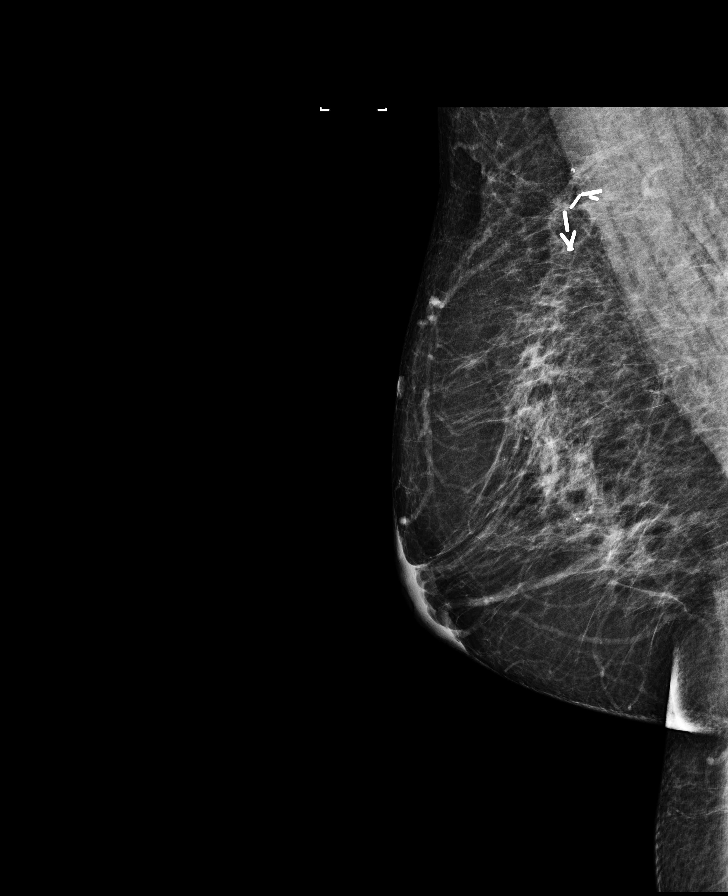

[R CC]
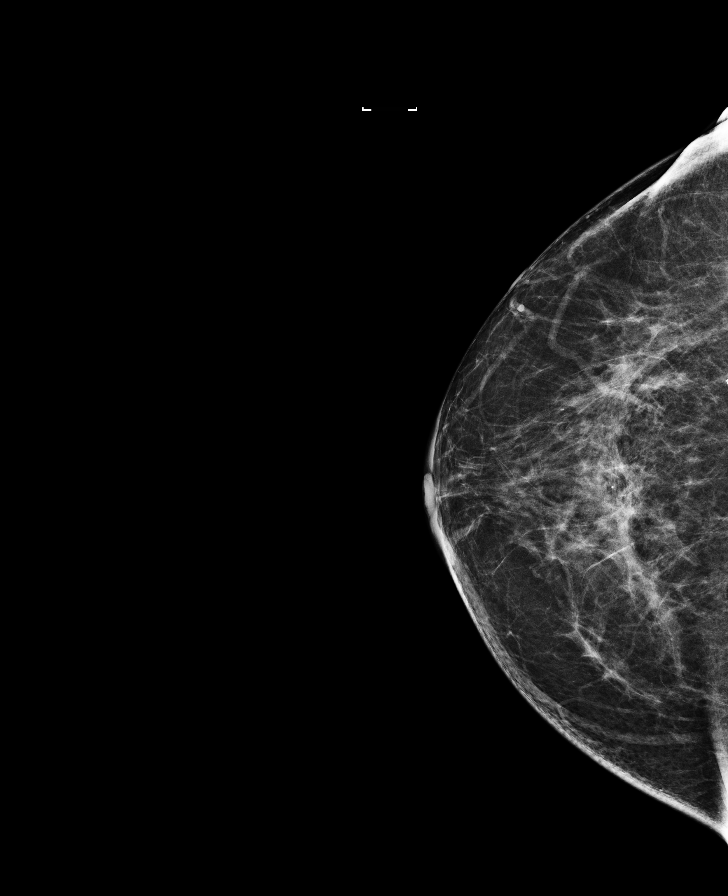

[L MLO]
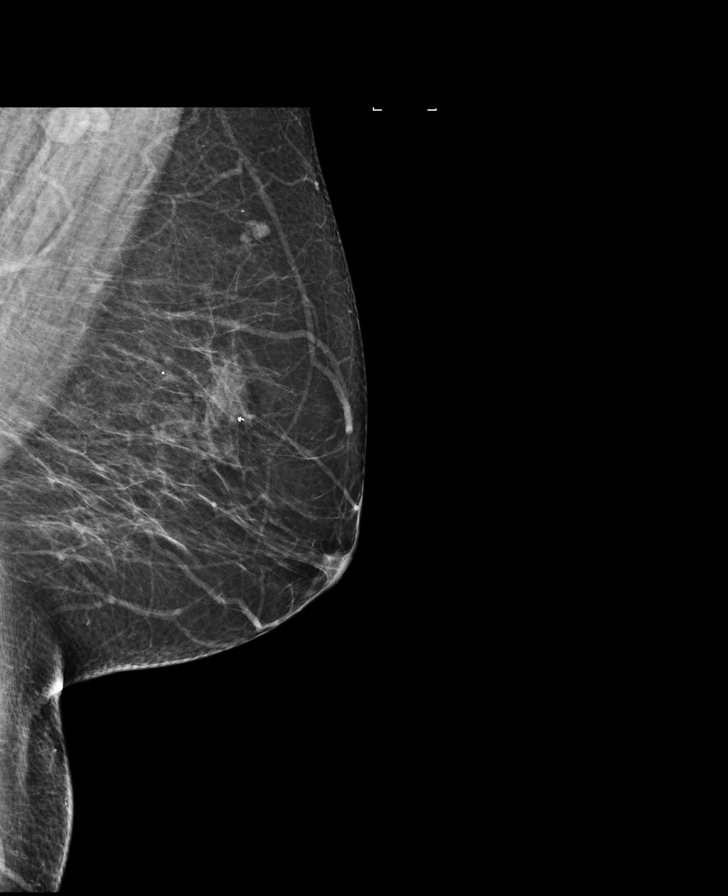

[R CC synth-2D]
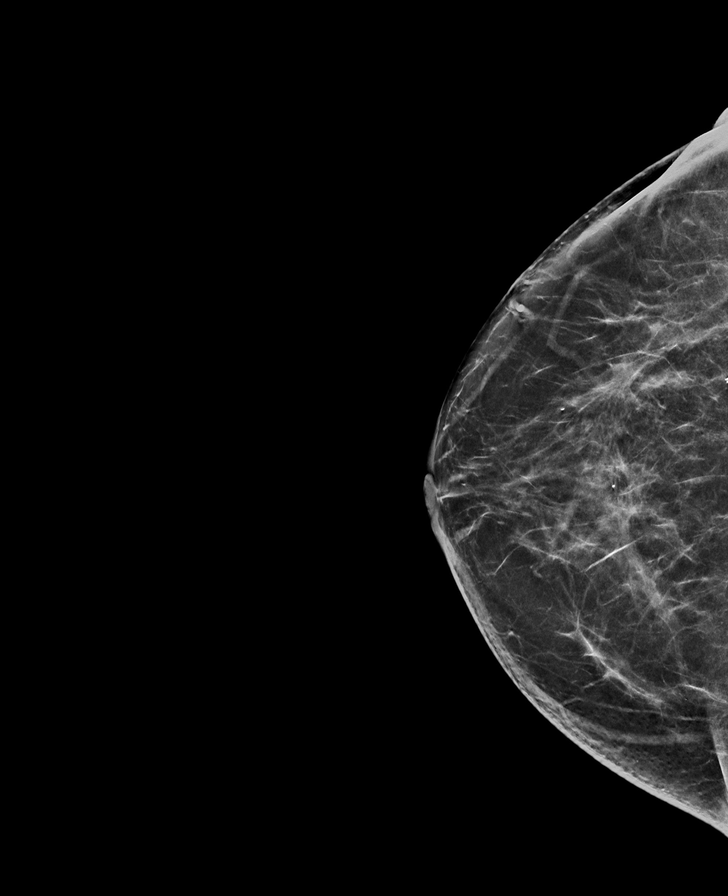

[8 of 29 positions shown; findings below may reference images not displayed]

ACR Breast Density Category b: There are scattered areas of
fibroglandular density.
FINDINGS: There are expected postsurgical changes within the upper-outer
quadrant of the right breast. Biopsy site marker within the left
breast is stable in position. There are no new dominant masses,
suspicious calcifications or secondary signs of malignancy within
either breast.

Mammographic images were processed with CAD.
IMPRESSION: No evidence of malignancy within either breast. Expected
postsurgical changes within the right breast.

RECOMMENDATION:
Bilateral diagnostic mammogram in 1 year.

I have discussed the findings and recommendations with the patient.
Results were also provided in writing at the conclusion of the
visit. If applicable, a reminder letter will be sent to the patient
regarding the next appointment.

BI-RADS CATEGORY  2: Benign.

## 2017-04-22 ENCOUNTER — Other Ambulatory Visit: Payer: Self-pay | Admitting: Hematology and Oncology

## 2017-04-22 DIAGNOSIS — Z853 Personal history of malignant neoplasm of breast: Secondary | ICD-10-CM

## 2017-05-01 ENCOUNTER — Ambulatory Visit
Admission: RE | Admit: 2017-05-01 | Discharge: 2017-05-01 | Disposition: A | Discharge status: Home / Self Care | Payer: Medicaid Other | Source: Ambulatory Visit | Attending: Hematology and Oncology | Admitting: Hematology and Oncology

## 2017-05-01 DIAGNOSIS — Z853 Personal history of malignant neoplasm of breast: Secondary | ICD-10-CM

## 2017-11-04 ENCOUNTER — Telehealth: Payer: Self-pay | Admitting: Hematology and Oncology

## 2017-11-04 NOTE — Telephone Encounter (Signed)
Called pt and left message. ([facility]) rescheduled appt w/ [facility] from 7/19 to 7/30 @ 10:30 am.  Mailed calendar.

## 2017-11-26 ENCOUNTER — Other Ambulatory Visit: Payer: Self-pay | Admitting: Hematology and Oncology

## 2017-11-26 DIAGNOSIS — C50411 Malignant neoplasm of upper-outer quadrant of right female breast: Secondary | ICD-10-CM

## 2017-12-19 ENCOUNTER — Ambulatory Visit: Payer: Self-pay | Admitting: Hematology and Oncology

## 2017-12-30 ENCOUNTER — Telehealth: Payer: Self-pay | Admitting: Hematology and Oncology

## 2017-12-30 ENCOUNTER — Inpatient Hospital Stay: Payer: Medicaid Other | Attending: Hematology and Oncology | Admitting: Hematology and Oncology

## 2017-12-30 ENCOUNTER — Other Ambulatory Visit: Payer: Self-pay

## 2017-12-30 DIAGNOSIS — C50411 Malignant neoplasm of upper-outer quadrant of right female breast: Secondary | ICD-10-CM

## 2017-12-30 DIAGNOSIS — Z79899 Other long term (current) drug therapy: Secondary | ICD-10-CM | POA: Diagnosis not present

## 2017-12-30 DIAGNOSIS — N644 Mastodynia: Secondary | ICD-10-CM | POA: Insufficient documentation

## 2017-12-30 DIAGNOSIS — Z79811 Long term (current) use of aromatase inhibitors: Secondary | ICD-10-CM | POA: Diagnosis not present

## 2017-12-30 DIAGNOSIS — Z170 Estrogen receptor positive status [ER+]: Secondary | ICD-10-CM | POA: Insufficient documentation

## 2017-12-30 DIAGNOSIS — Z923 Personal history of irradiation: Secondary | ICD-10-CM | POA: Insufficient documentation

## 2017-12-30 DIAGNOSIS — Z7982 Long term (current) use of aspirin: Secondary | ICD-10-CM | POA: Insufficient documentation

## 2017-12-30 DIAGNOSIS — N951 Menopausal and female climacteric states: Secondary | ICD-10-CM | POA: Insufficient documentation

## 2017-12-30 DIAGNOSIS — M791 Myalgia, unspecified site: Secondary | ICD-10-CM | POA: Diagnosis not present

## 2017-12-30 MED ORDER — DIAZEPAM 5 MG PO TABS: 5.0000 mg | ORAL_TABLET | Freq: Four times a day (QID) | ORAL | 0 refills | Status: AC | PRN

## 2017-12-30 MED ORDER — LETROZOLE 2.5 MG PO TABS: 2.5000 mg | ORAL_TABLET | Freq: Every day | ORAL | 3 refills | Status: DC

## 2017-12-30 MED ORDER — QUETIAPINE FUMARATE ER 150 MG PO TB24: 600.0000 mg | ORAL_TABLET | Freq: Every day | ORAL | Status: DC

## 2017-12-30 NOTE — Progress Notes (Signed)
Per Dr Lindi Adie pt would like referral to dermatology for pt reports today she use to tan a lot and is concerned about a spot on her lower leg.  Referral originally to [facility] but pt has Medicaid and they don't accept medicaid.  Was able to refer her to [facility] in [place]- they accept Medicaid.  Spoke with Estill Bamberg in referrals and faxed her most recent note, referral sheet, and demo sheet to [phone]. They will contact us with appt and they will contact patient with appt also.

## 2017-12-30 NOTE — Progress Notes (Signed)
Patient Care Team: Nolene Ebbs, MD as PCP - General (Internal Medicine) Rolm Bookbinder, MD as Consulting Physician (General Surgery) Nicholas Lose, MD as Consulting Physician (Hematology and Oncology) Arloa Koh, MD as Consulting Physician (Radiation Oncology) Thea Silversmith, MD as Consulting Physician (Radiation Oncology) Sylvan Cheese, NP as Nurse Practitioner (Hematology and Oncology)  DIAGNOSIS:  Encounter Diagnosis  Name Primary?  . Malignant neoplasm of upper-outer quadrant of right breast in female, estrogen receptor positive ([place])     SUMMARY OF ONCOLOGIC HISTORY:   [name] ([place])   03/20/2015 Mammogram    Right breast mass upper outer quadrant 4-5 mm focus, ultrasound axilla normal-appearing lymph nodes      04/05/2015 Initial Diagnosis    Right breast biopsy 10:00 position: 8 cm from the nipple, invasive ductal carcinoma grade 1, ER/PR positive, HER-2 negative, ratio 1.14, Ki-67 5%      04/05/2015 Clinical Stage    Stage IA: T1a N0      05/12/2015 Surgery    Right lumpectomy: Invasive ductal carcinoma grade 1, 0.9 cm, ADH, margins negative, 0/1 lymph nodes, ER 100%, PR 90%, HER-2 negative ratio 1.1, Ki-67 5%      05/12/2015 Pathologic Stage    Stage IA: T1b N0      05/12/2015 Oncotype testing    RS 20 (13% ROR)      07/03/2015 - 08/16/2015 Radiation Therapy    Adjuvant radiation therapy by Dr. Pablo Ledger: Right breast / 45 Pearline Cables @ 1.8 Pearline Cables per fraction x 25 fractions; right breast boost / 16 Gray at [employer] per fraction x 8 fractions      09/25/2015 -  Anti-estrogen oral therapy    Anastrozole 1 mg daily switched to letrozole 12/30/2017 due to muscle pains      10/17/2015 Survivorship    Survivorship visit completed and copy of care plan given to patient       CHIEF COMPLIANT: Complaining of bilateral breast discomfort  INTERVAL HISTORY: April [name] is a 52 year old with  above-mentioned history of stage Ia right breast cancer treated with lumpectomy radiation is currently on antiestrogen therapy with anastrozole.  She is complaining of bilateral breast and pectoralis muscle pain.  She is on muscle relaxants she appears to be helping her.  She denies any lumps or nodules in the breast.  REVIEW OF SYSTEMS:   Constitutional: Denies fevers, chills or abnormal weight loss Eyes: Denies blurriness of vision Ears, nose, mouth, throat, and face: Denies mucositis or sore throat Respiratory: Denies cough, dyspnea or wheezes Cardiovascular: Denies palpitation, chest discomfort Gastrointestinal:  Denies nausea, heartburn or change in bowel habits Skin: Denies abnormal skin rashes Lymphatics: Denies new lymphadenopathy or easy bruising Neurological:Denies numbness, tingling or new weaknesses Behavioral/Psych: Mood is stable, no new changes  Extremities: No lower extremity edema Breast: Bilateral breast tenderness All other systems were reviewed with the patient and are negative.  I have reviewed the past medical history, past surgical history, social history and family history with the patient and they are unchanged from previous note.  ALLERGIES:  is allergic to morphine and related and hydrocodone.  MEDICATIONS:  Current Outpatient Medications  Medication Sig Dispense Refill  . aspirin 81 MG chewable tablet Chew 1 tablet (81 mg total) by mouth daily.    Marland Kitchen atorvastatin (LIPITOR) 20 MG tablet Take 1 tablet (20 mg total) by mouth daily at 6 PM. 30 tablet 2  . baclofen (LIORESAL) 20 MG tablet Take 1 tablet (20 mg  total) by mouth 2 (two) times daily.    . diazepam (VALIUM) 5 MG tablet Take 1 tablet (5 mg total) by mouth every 6 (six) hours as needed for anxiety. 30 tablet 0  . gabapentin (NEURONTIN) 300 MG capsule Take 300 mg by mouth 3 (three) times daily.    Marland Kitchen ibuprofen (ADVIL,MOTRIN) 200 MG tablet Take 200 mg by mouth every 6 (six) hours as needed.    Marland Kitchen letrozole  (FEMARA) 2.5 MG tablet Take 1 tablet (2.5 mg total) by mouth daily. 90 tablet 3  . metoprolol succinate (TOPROL-XL) 50 MG 24 hr tablet Take 50 mg by mouth daily. Take with or immediately following a meal.    . QUEtiapine Fumarate (SEROQUEL XR) 150 MG 24 hr tablet Take 4 tablets (600 mg total) by mouth daily at 8 pm. 30 tablet   . solifenacin (VESICARE) 5 MG tablet Take 5 mg by mouth daily.    Marland Kitchen topiramate (TOPAMAX) 100 MG tablet Take 100 mg by mouth 2 (two) times daily.     Current Facility-Administered Medications  Medication Dose Route Frequency Provider Last Rate Last Dose  . 0.9 %  sodium chloride infusion  500 mL Intravenous Continuous Armbruster, Carlota Raspberry, MD        PHYSICAL EXAMINATION: ECOG PERFORMANCE STATUS: 1 - Symptomatic but completely ambulatory  Vitals:   12/30/17 1045  BP: 113/70  Pulse: 97  Resp: 17  Temp: 98.7 F (37.1 C)  SpO2: 96%   Filed Weights   12/30/17 1045  Weight: 218 lb 1.6 oz (98.9 kg)    GENERAL:alert, no distress and comfortable SKIN: skin color, texture, turgor are normal, no rashes or significant lesions EYES: normal, Conjunctiva are pink and non-injected, sclera clear OROPHARYNX:no exudate, no erythema and lips, buccal mucosa, and tongue normal  NECK: supple, thyroid normal size, non-tender, without nodularity LYMPH:  no palpable lymphadenopathy in the cervical, axillary or inguinal LUNGS: clear to auscultation and percussion with normal breathing effort HEART: regular rate & rhythm and no murmurs and no lower extremity edema ABDOMEN:abdomen soft, non-tender and normal bowel sounds MUSCULOSKELETAL:no cyanosis of digits and no clubbing  NEURO: alert & oriented x 3 with fluent speech, no focal motor/sensory deficits EXTREMITIES: No lower extremity edema BREAST: Bilateral breast tenderness no palpable lumps or nodules (exam performed in the presence of a chaperone)  LABORATORY DATA:  I have reviewed the data as listed CMP Latest Ref Rng &  Units 12/06/2015 04/05/2015 05/11/2013  Glucose 70 - 140 mg/dl 115 93 107(H)  BUN 7.0 - 26.0 mg/dL 12.2 10.1 9  Creatinine 0.6 - 1.1 mg/dL 1.0 0.9 1.20(H)  Sodium 136 - 145 mEq/L 138 140 136  Potassium 3.5 - 5.1 mEq/L 4.2 4.1 3.6  Chloride 96 - 112 mEq/L - - 97  CO2 22 - 29 mEq/L _0 Calcium 8.4 - 10.4 mg/dL 9.2 9.0 9.7  Total Protein 6.4 - 8.3 g/dL 7.3 6.8 -  Total Bilirubin 0.20 - 1.20 mg/dL 0.36 <0.30 -  Alkaline Phos 40 - 150 U/L 135 165(H) -  AST 5 - 34 U/L 16 34 -  ALT 0 - 55 U/L 22 39 -    Lab Results  Component Value Date   WBC 7.8 12/06/2015   HGB 14.4 12/06/2015   HCT 43.5 12/06/2015   MCV 91.6 12/06/2015   PLT 249 12/06/2015   NEUTROABS 4.1 12/06/2015    ASSESSMENT & PLAN:  [name] ([place]) Right lumpectomy 05/12/2015:  Invasive ductal carcinoma grade 1, 0.9 cm, ADH, margins negative, 0/1 lymph nodes, T1BN0 stage IA pathologic stage, ER 100%, PR 90%, HER-2 negative ratio 1.1, Ki-67 5% Oncotype DX score 20, 13% risk of recurrence, intermediate risk Completed radiation therapy 08/16/2015 Estradiol: < 5; FSH: 110 Menopausal  Current Treatment: Anastrozole 1 mg daily started 09/25/15  Anastrozole Toxicities: 1. Occasional hot flashes but they were present even prior to starting the treatment 2. Muscular skeletal aches and pains also present prior to starting anastrozole.  Pectoral muscle pain: She has been taking muscle relaxants for this.  I discussed with her about stopping anastrozole and switching her to letrozole.  I suspect that the pectoralis muscle pain is related to anastrozole therapy. The other possibility is fibromyalgia.  Breast Cancer Surveillance: 1. Breast exam July 2019: Benign 2. Mammogram 05/01/2017: No evidence of malignancy breast density category C  Return to clinic in 1 year for follow-up  No orders of the defined types were placed in this encounter.  The patient has a good understanding  of the overall plan. she agrees with it. she will call with any problems that may develop before the next visit here.   Harriette Ohara, MD 12/30/17

## 2017-12-30 NOTE — Assessment & Plan Note (Signed)
Right lumpectomy 05/12/2015: Invasive ductal carcinoma grade 1, 0.9 cm, ADH, margins negative, 0/1 lymph nodes, T1BN0 stage IA pathologic stage, ER 100%, PR 90%, HER-2 negative ratio 1.1, Ki-67 5% Oncotype DX score 20, 13% risk of recurrence, intermediate risk Completed radiation therapy 08/16/2015 Estradiol: < 5; FSH: 110 Menopausal  Current Treatment: Anastrozole 1 mg daily started 09/25/15  Anastrozole Toxicities: 1. Occasional hot flashes but they were present even prior to starting the treatment 2. Muscular skeletal aches and pains also present prior to starting anastrozole. Not related to anastrozole therapy.  Breast Cancer Surveillance: 1. Breast exam July 2019: Benign 2. Mammogram 05/01/2017: No evidence of malignancy breast density category C  Return to clinic in 1 year for follow-up

## 2017-12-30 NOTE — Telephone Encounter (Signed)
Per 7/30 los.  Gave patient avs and calendar.

## 2018-02-08 ENCOUNTER — Emergency Department (HOSPITAL_BASED_OUTPATIENT_CLINIC_OR_DEPARTMENT_OTHER): Payer: Medicare Other

## 2018-02-08 ENCOUNTER — Emergency Department (HOSPITAL_BASED_OUTPATIENT_CLINIC_OR_DEPARTMENT_OTHER)
Admission: EM | Admit: 2018-02-08 | Discharge: 2018-02-08 | Disposition: A | Discharge status: Home / Self Care | Payer: Medicare Other | Attending: Emergency Medicine | Admitting: Emergency Medicine

## 2018-02-08 ENCOUNTER — Encounter (HOSPITAL_BASED_OUTPATIENT_CLINIC_OR_DEPARTMENT_OTHER): Payer: Self-pay | Admitting: Adult Health

## 2018-02-08 ENCOUNTER — Other Ambulatory Visit: Payer: Self-pay

## 2018-02-08 DIAGNOSIS — I10 Essential (primary) hypertension: Secondary | ICD-10-CM | POA: Diagnosis not present

## 2018-02-08 DIAGNOSIS — R0602 Shortness of breath: Secondary | ICD-10-CM | POA: Diagnosis present

## 2018-02-08 DIAGNOSIS — F17210 Nicotine dependence, cigarettes, uncomplicated: Secondary | ICD-10-CM | POA: Insufficient documentation

## 2018-02-08 DIAGNOSIS — Z79899 Other long term (current) drug therapy: Secondary | ICD-10-CM | POA: Diagnosis not present

## 2018-02-08 DIAGNOSIS — J441 Chronic obstructive pulmonary disease with (acute) exacerbation: Secondary | ICD-10-CM | POA: Diagnosis not present

## 2018-02-08 DIAGNOSIS — Z7982 Long term (current) use of aspirin: Secondary | ICD-10-CM | POA: Insufficient documentation

## 2018-02-08 DIAGNOSIS — Z853 Personal history of malignant neoplasm of breast: Secondary | ICD-10-CM | POA: Insufficient documentation

## 2018-02-08 DIAGNOSIS — Z8673 Personal history of transient ischemic attack (TIA), and cerebral infarction without residual deficits: Secondary | ICD-10-CM | POA: Diagnosis not present

## 2018-02-08 LAB — BASIC METABOLIC PANEL
ANION GAP: 12 (ref 5–15)
BUN: 13 mg/dL (ref 6–20)
CALCIUM: 9.3 mg/dL (ref 8.9–10.3)
CO2: 23 mmol/L (ref 22–32)
CREATININE: 0.95 mg/dL (ref 0.44–1.00)
Chloride: 102 mmol/L (ref 98–111)
GFR calc Af Amer: >60 mL/min (ref >60)
GLUCOSE: 124 mg/dL — AB (ref 70–99)
Potassium: 3.8 mmol/L (ref 3.5–5.1)
Sodium: 137 mmol/L (ref 135–145)

## 2018-02-08 LAB — CBC WITH DIFFERENTIAL/PLATELET
BASOS ABS: 0 10*3/uL (ref 0.0–0.1)
Basophils Relative: 0 %
EOS PCT: 1 %
Eosinophils Absolute: 0.1 10*3/uL (ref 0.0–0.7)
HCT: 44.5 % (ref 36.0–46.0)
Hemoglobin: 14.8 g/dL (ref 12.0–15.0)
LYMPHS PCT: 24 %
Lymphs Abs: 2.7 10*3/uL (ref 0.7–4.0)
MCH: 30.1 pg (ref 26.0–34.0)
MCHC: 33.3 g/dL (ref 30.0–36.0)
MCV: 90.6 fL (ref 78.0–100.0)
MONO ABS: 0.8 10*3/uL (ref 0.1–1.0)
Monocytes Relative: 7 %
Neutro Abs: 7.5 10*3/uL (ref 1.7–7.7)
Neutrophils Relative %: 68 %
PLATELETS: 271 10*3/uL (ref 150–400)
RBC: 4.91 MIL/uL (ref 3.87–5.11)
RDW: 13.9 % (ref 11.5–15.5)
WBC: 11.1 10*3/uL — ABNORMAL HIGH (ref 4.0–10.5)

## 2018-02-08 LAB — I-STAT CG4 LACTIC ACID, ED: Lactic Acid, Venous: 2.09 mmol/L (ref 0.5–1.9)

## 2018-02-08 MED ORDER — ALBUTEROL SULFATE (2.5 MG/3ML) 0.083% IN NEBU
2.5000 mg | INHALATION_SOLUTION | Freq: Once | RESPIRATORY_TRACT | Status: AC
  Administered 2018-02-08: 2.5 mg via RESPIRATORY_TRACT
  Filled 2018-02-08: qty 3

## 2018-02-08 MED ORDER — IPRATROPIUM-ALBUTEROL 0.5-2.5 (3) MG/3ML IN SOLN
3.0000 mL | Freq: Four times a day (QID) | RESPIRATORY_TRACT | Status: DC
  Administered 2018-02-08: 3 mL via RESPIRATORY_TRACT
  Filled 2018-02-08: qty 3

## 2018-02-08 MED ORDER — ALBUTEROL SULFATE (2.5 MG/3ML) 0.083% IN NEBU: 5.0000 mg | INHALATION_SOLUTION | Freq: Once | RESPIRATORY_TRACT | Status: DC

## 2018-02-08 MED ORDER — PREDNISONE 20 MG PO TABS: 40.0000 mg | ORAL_TABLET | Freq: Every day | ORAL | 0 refills | Status: DC

## 2018-02-08 MED ORDER — IPRATROPIUM-ALBUTEROL 0.5-2.5 (3) MG/3ML IN SOLN
3.0000 mL | Freq: Once | RESPIRATORY_TRACT | Status: AC
  Administered 2018-02-08: 3 mL via RESPIRATORY_TRACT
  Filled 2018-02-08: qty 3

## 2018-02-08 MED ORDER — ALBUTEROL SULFATE HFA 108 (90 BASE) MCG/ACT IN AERS
2.0000 | INHALATION_SPRAY | RESPIRATORY_TRACT | Status: DC | PRN
  Administered 2018-02-08: 2 via RESPIRATORY_TRACT
  Filled 2018-02-08: qty 6.7

## 2018-02-08 MED ORDER — AEROCHAMBER PLUS FLO-VU SMALL MISC
1.0000 | Freq: Once | Status: AC
  Administered 2018-02-08: 1
  Filled 2018-02-08: qty 1

## 2018-02-08 MED ORDER — AZITHROMYCIN 250 MG PO TABS: 250.0000 mg | ORAL_TABLET | Freq: Every day | ORAL | 0 refills | Status: DC

## 2018-02-08 MED ORDER — SODIUM CHLORIDE 0.9 % IV BOLUS
1000.0000 mL | Freq: Once | INTRAVENOUS | Status: AC
  Administered 2018-02-08: 1000 mL via INTRAVENOUS

## 2018-02-08 MED ORDER — METHYLPREDNISOLONE SODIUM SUCC 125 MG IJ SOLR
125.0000 mg | Freq: Once | INTRAMUSCULAR | Status: AC
  Administered 2018-02-08: 125 mg via INTRAVENOUS
  Filled 2018-02-08: qty 2

## 2018-02-08 MED ORDER — ACETAMINOPHEN 325 MG PO TABS
ORAL_TABLET | ORAL | Status: AC
  Filled 2018-02-08: qty 2

## 2018-02-08 MED ORDER — ACETAMINOPHEN 325 MG PO TABS
650.0000 mg | ORAL_TABLET | Freq: Once | ORAL | Status: AC
  Administered 2018-02-08: 650 mg via ORAL

## 2018-02-08 NOTE — ED Provider Notes (Signed)
[facility] EMERGENCY DEPARTMENT Provider Note   CSN: [id number] Arrival date & time: 02/08/18  1355     History   Chief Complaint Chief Complaint  Patient presents with  . Shortness of Breath    HPI Stepahnie [name] is a 52 y.o. female.  Patient with smoking history presents the emergency department with productive cough, fatigue, fever ongoing over the past 4 to 5 days.  She was at an outside urgent care prior to arrival and was sent to the emergency department for further evaluation.  Patient has chest tightness.  She was found to have a high heart rate and low oxygen saturation.  She has never been diagnosed with COPD but her parents and sister have died from COPD.  No significant chest pain but she does feel pain in her back.  No abdominal pain.  Patient has chronic lower extremity swelling for which she takes Lasix but has never been diagnosed with congestive heart failure. The onset of this condition was acute. The course is constant. Aggravating factors: none. Alleviating factors: none.       Past Medical History:  Diagnosis Date  . Anxiety   . Arthritis    knees  . Bipolar 1 disorder ([place])   . Breast cancer ([place])    breast cancer right   . Convulsions/seizures ([employer]) 03/16/2015   08-27-2014 last seizure  . Depression 05/06/2012  . Depression, major   . History of stroke 09/14/2014  . Hyperlipidemia 05/06/2012  . Hypertension   . Obesity   . Personal history of radiation therapy   . Schizo affective schizophrenia ([place])   . Stroke ([place])    05/02/12, no deficits  . Tobacco abuse 05/06/2012    Patient Active Problem List   Diagnosis Date Noted  . [name] ([place]) 04/05/2015  . Convulsions/seizures ([place]) 03/16/2015  . History of stroke 09/14/2014  . Bipolar 1 disorder ([place])   . Schizo affective schizophrenia ([place])   . Depression, major   . Stroke ([place])   . Hyperkalemia 05/08/2012  . Headache 05/08/2012  .  Hallucination 05/06/2012  . Hyperlipidemia 05/06/2012  . Tobacco abuse 05/06/2012  . Depression 05/06/2012  . Hypertension 05/04/2012  . CVA (cerebral infarction) 05/04/2012  . Schizoaffective disorder, bipolar type ([place]) 05/04/2012    Past Surgical History:  Procedure Laterality Date  . ABDOMINAL HYSTERECTOMY    . BREAST BIOPSY Left 2007  . BREAST LUMPECTOMY Right 2016  . CHOLECYSTECTOMY    . RADIOACTIVE SEED GUIDED PARTIAL MASTECTOMY WITH AXILLARY SENTINEL LYMPH NODE BIOPSY Right 05/12/2015   no mastectomy  . TEE WITHOUT CARDIOVERSION  05/06/2012   Procedure: TRANSESOPHAGEAL ECHOCARDIOGRAM (TEE);  Surgeon: Larey Dresser, MD;  Location: [facility];  Service: Cardiovascular;  Laterality: N/A;     OB History    Gravida  3   Para  3   Term  3   Preterm      AB      Living  3     SAB      TAB      Ectopic      Multiple      Live Births  3        Obstetric Comments  She menarched at early age of 59  She had 3 pregnancy, her first child was born at age 52  She has not received birth control pills.  She was never exposed to fertility medications or hormone replacement therapy.  She  has no family history of Br east/GYN/GI cancer          Home Medications    Prior to Admission medications   Medication Sig Start Date End Date Taking? Authorizing Provider  aspirin 81 MG chewable tablet Chew 1 tablet (81 mg total) by mouth daily. 05/14/12   Orson Eva, MD  atorvastatin (LIPITOR) 20 MG tablet Take 1 tablet (20 mg total) by mouth daily at 6 PM. 05/14/12   Tat, Shanon Brow, MD  baclofen (LIORESAL) 20 MG tablet Take 1 tablet (20 mg total) by mouth 2 (two) times daily. 03/16/15   Kathrynn Ducking, MD  diazepam (VALIUM) 5 MG tablet Take 1 tablet (5 mg total) by mouth every 6 (six) hours as needed for anxiety. 12/30/17   Nicholas Lose, MD  gabapentin (NEURONTIN) 300 MG capsule Take 300 mg by mouth 3 (three) times daily.    [provider]  ibuprofen  (ADVIL,MOTRIN) 200 MG tablet Take 200 mg by mouth every 6 (six) hours as needed.    [provider]  letrozole (FEMARA) 2.5 MG tablet Take 1 tablet (2.5 mg total) by mouth daily. 12/30/17   Nicholas Lose, MD  metoprolol succinate (TOPROL-XL) 50 MG 24 hr tablet Take 50 mg by mouth daily. Take with or immediately following a meal.    [provider]  QUEtiapine Fumarate (SEROQUEL XR) 150 MG 24 hr tablet Take 4 tablets (600 mg total) by mouth daily at 8 pm. 12/30/17   Nicholas Lose, MD  solifenacin (VESICARE) 5 MG tablet Take 5 mg by mouth daily.    [provider]  topiramate (TOPAMAX) 100 MG tablet Take 100 mg by mouth 2 (two) times daily.    [provider]    Family History Family History  Problem Relation Age of Onset  . Multiple sclerosis Sister   . COPD Sister   . Anxiety disorder Brother   . Colon polyps Brother   . COPD Mother   . COPD Father   . Colon polyps Brother   . Colon polyps Brother   . COPD Sister   . Colon cancer Neg Hx   . Esophageal cancer Neg Hx   . Rectal cancer Neg Hx   . Stomach cancer Neg Hx     Social History Social History   Tobacco Use  . Smoking status: Current Every Day Smoker    Packs/day: 0.50    Types: Cigarettes  . Smokeless tobacco: Never Used  Substance Use Topics  . Alcohol use: No  . Drug use: No    Frequency: 7.0 times per week     Allergies   Morphine and related and Hydrocodone   Review of Systems Review of Systems  Constitutional: Positive for fatigue. Negative for chills and fever.  HENT: Negative for rhinorrhea and sore throat.   Eyes: Negative for redness.  Respiratory: Positive for cough, shortness of breath and wheezing.   Cardiovascular: Negative for chest pain.  Gastrointestinal: Negative for abdominal pain, diarrhea, nausea and vomiting.  Genitourinary: Negative for dysuria.  Musculoskeletal: Positive for back pain. Negative for myalgias.  Skin: Negative for rash.  Neurological:  Negative for headaches.     Physical Exam Updated Vital Signs BP (!) 143/89 (BP Location: Left Arm)   Pulse (!) 115   Temp 98.2 F (36.8 C) (Oral)   Resp 18   Ht 5\' 3"  (1.6 m)   Wt 98.9 kg   SpO2 94%   BMI 38.62 kg/m   Physical Exam  Constitutional:  She appears well-developed and well-nourished.  HENT:  Head: Normocephalic and atraumatic.  Eyes: Conjunctivae are normal. Right eye exhibits no discharge. Left eye exhibits no discharge.  Neck: Normal range of motion. Neck supple.  Cardiovascular: Regular rhythm and normal heart sounds. Tachycardia present.  Pulmonary/Chest: Effort normal. She has no decreased breath sounds. She has wheezes. She has no rhonchi. She has no rales.  Abdominal: Soft. There is no tenderness.  Musculoskeletal:       Right lower leg: She exhibits no tenderness and no edema.       Left lower leg: She exhibits no tenderness and no edema.  Neurological: She is alert.  Skin: Skin is warm and dry.  Psychiatric: She has a normal mood and affect.  Nursing note and vitals reviewed.    ED Treatments / Results  Labs (all labs ordered are listed, but only abnormal results are displayed) Labs Reviewed  CBC WITH DIFFERENTIAL/PLATELET - Abnormal; Notable for the following components:      Result Value   WBC 11.1 (*)    All other components within normal limits  BASIC METABOLIC PANEL - Abnormal; Notable for the following components:   Glucose, Bld 124 (*)    All other components within normal limits  I-STAT CG4 LACTIC ACID, ED - Abnormal; Notable for the following components:   Lactic Acid, Venous 2.09 (*)    All other components within normal limits    EKG EKG Interpretation  Date/Time:  Sunday February 08 2018 14:26:34 EDT Ventricular Rate:  113 PR Interval:    QRS Duration: 78 QT Interval:  347 QTC Calculation: 476 R Axis:   22 Text Interpretation:  Sinus tachycardia Since last tracing rate faster Confirmed by Wandra Arthurs [phone]) on 02/08/2018  4:01:56 PM   Radiology Dg Chest 2 View  Result Date: 02/08/2018 CLINICAL DATA:  Cough, fever and fatigue for the past 3 days. Smoker. EXAM: CHEST - 2 VIEW COMPARISON:  05/16/2013 FINDINGS: Normal sized heart. Clear lungs. Diffuse peribronchial thickening and accentuation of the interstitial markings with mild hyperexpansion of the lungs. Interval right breast surgical clips. Cholecystectomy clips. IMPRESSION: Changes of COPD and chronic bronchitis. No acute abnormality. Electronically Signed   By: Claudie Revering M.D.   On: 02/08/2018 15:07    Procedures Procedures (including critical care time)  Medications Ordered in ED Medications  ipratropium-albuterol (DUONEB) 0.5-2.5 (3) MG/3ML nebulizer solution 3 mL (3 mLs Nebulization Given 02/08/18 1546)  albuterol (PROVENTIL HFA;VENTOLIN HFA) 108 (90 Base) MCG/ACT inhaler 2 puff (has no administration in time range)  AEROCHAMBER PLUS FLO-VU SMALL device MISC 1 each (has no administration in time range)  ipratropium-albuterol (DUONEB) 0.5-2.5 (3) MG/3ML nebulizer solution 3 mL (3 mLs Nebulization Given 02/08/18 1430)  albuterol (PROVENTIL) (2.5 MG/3ML) 0.083% nebulizer solution 2.5 mg (2.5 mg Nebulization Given 02/08/18 1430)  methylPREDNISolone sodium succinate (SOLU-MEDROL) 125 mg/2 mL injection 125 mg (125 mg Intravenous Given 02/08/18 1519)  albuterol (PROVENTIL) (2.5 MG/3ML) 0.083% nebulizer solution 2.5 mg (2.5 mg Nebulization Given 02/08/18 1546)  sodium chloride 0.9 % bolus 1,000 mL (1,000 mLs Intravenous New Bag/Given 02/08/18 1550)  acetaminophen (TYLENOL) tablet 650 mg (650 mg Oral Given 02/08/18 1555)     Initial Impression / Assessment and Plan / ED Course  I have reviewed the triage vital signs and the nursing notes.  Pertinent labs & imaging results that were available during my care of the patient were reviewed by me and considered in my medical decision making (see chart for details).  Patient seen and examined. Work-up initiated.  Medications ordered.   Vital signs reviewed and are as follows: BP (!) 143/89 (BP Location: Left Arm)   Pulse (!) 115   Temp 98.2 F (36.8 C) (Oral)   Resp 18   Ht 5\' 3"  (1.6 m)   Wt 98.9 kg   SpO2 94%   BMI 38.62 kg/m   4:44 PM Patient discussed with Dr. Darl Householder who has seen patient.   Patient with clinical improvement after 2 breathing treatments, IV steroids.   Pt wants to go home. HR 105-110. She's ambulated in hall with O2 sat 93% and above. She wants to go home.   Strongly encouraged close PCP follow-up. She will likely need more treatment and eval for COPD.    Home today with albuterol, prednisone, azithromycin. Encouraged return to ED with worsening SOB, fever, CP.   Final Clinical Impressions(s) / ED Diagnoses   Final diagnoses:  COPD exacerbation ([place])  SOB (shortness of breath)   COPD exacerbation: Exam consistent with this.  Patient has strong family history and tobacco use.  Chest x-ray suggestive.  Patient with extensive wheezing. Maintaining O2 sat above 91% with activity. No fever. No PNA on CXR.  Heart rate elevated in setting of albuterol use.  Patient does not have clinical signs and symptoms of DVT or PE.  No chest pain to suggest ACS and EKG is nonischemic.  Treatment as above.  ED Discharge Orders         Ordered    predniSONE (DELTASONE) 20 MG tablet  Daily     02/08/18 1642    azithromycin (ZITHROMAX) 250 MG tablet  Daily,   Status:  Discontinued     02/08/18 1642    azithromycin (ZITHROMAX) 250 MG tablet  Daily     02/08/18 1646           Carlisle Cater, PA-C 02/08/18 1654    Maudie Flakes, MD 02/09/18 1553

## 2018-02-08 NOTE — ED Notes (Signed)
ED Provider at bedside. 

## 2018-02-08 NOTE — ED Notes (Signed)
Ambulated on r/a, HR 120-130, RR 20-24, walked ~52ft before requesting to return to room, +DOE, +dizziness.  Returned to room in wheelchair.  VS updated

## 2018-02-08 NOTE — ED Triage Notes (Signed)
PResents with cough, fatigue and fever for the past few days. Cough is described as non-productive and associated with back pain. Bilateral inspiratory and expiratory wheezes, left side with crackles.

## 2018-02-08 NOTE — ED Notes (Signed)
Patient transported to X-ray 

## 2018-02-08 NOTE — ED Notes (Signed)
Patient tried family members NEB treatment last night without benefit.  Unsure how old meds are.

## 2018-02-08 NOTE — Discharge Instructions (Signed)
Please read and follow all provided instructions.  Your diagnoses today include:  1. COPD exacerbation ([place])   2. SOB (shortness of breath)    Tests performed today include:  Chest x-ray - did not show pneumonia but does show changes of COPD  Blood counts and electrolytes  Lactic acid  EKG  Vital signs. See below for your results today.   Medications prescribed:   Take any prescribed medications only as directed.  Home care instructions:  Follow any educational materials contained in this packet.  Follow-up instructions: Please follow-up with your primary care provider in the next 3 days for further evaluation of your symptoms and management of your COPD.   Return instructions:   Please return to the Emergency Department if you experience worsening symptoms.  Please return with worsening wheezing, shortness of breath, or difficulty breathing.  Return with persistent fever above 101F.   Please return if you have any other emergent concerns.  Additional Information:  Your vital signs today were: BP (!) 147/90    Pulse (!) 109    Temp 98.2 F (36.8 C) (Oral)    Resp (!) 22    Ht 5\' 3"  (1.6 m)    Wt 98.9 kg    SpO2 96%    BMI 38.62 kg/m  If your blood pressure (BP) was elevated above 135/85 this visit, please have this repeated by your doctor within one month. --------------

## 2018-02-08 NOTE — ED Notes (Signed)
Pt on cardiac monitor and auto VS 

## 2018-02-08 NOTE — ED Notes (Signed)
Ambulated pt to BR- pt reported feeling like she was going to pass out. Reported "seeing stars". Pt assisted to wheelchair- taken to X-ray

## 2018-03-26 ENCOUNTER — Other Ambulatory Visit: Payer: Self-pay | Admitting: Hematology and Oncology

## 2018-03-26 DIAGNOSIS — Z853 Personal history of malignant neoplasm of breast: Secondary | ICD-10-CM

## 2018-03-31 ENCOUNTER — Other Ambulatory Visit: Payer: Self-pay | Admitting: Internal Medicine

## 2018-03-31 DIAGNOSIS — E2839 Other primary ovarian failure: Secondary | ICD-10-CM

## 2018-04-28 ENCOUNTER — Other Ambulatory Visit: Payer: Self-pay | Admitting: Hematology and Oncology

## 2018-04-28 ENCOUNTER — Other Ambulatory Visit: Payer: Self-pay

## 2018-04-28 DIAGNOSIS — Z853 Personal history of malignant neoplasm of breast: Secondary | ICD-10-CM

## 2018-05-04 ENCOUNTER — Ambulatory Visit
Admission: RE | Admit: 2018-05-04 | Discharge: 2018-05-04 | Disposition: A | Discharge status: Home / Self Care | Payer: Medicare Other | Source: Ambulatory Visit | Attending: Hematology and Oncology | Admitting: Hematology and Oncology

## 2018-05-04 DIAGNOSIS — Z853 Personal history of malignant neoplasm of breast: Secondary | ICD-10-CM

## 2018-06-05 ENCOUNTER — Ambulatory Visit
Admission: RE | Admit: 2018-06-05 | Discharge: 2018-06-05 | Disposition: A | Discharge status: Home / Self Care | Payer: Medicare PPO | Source: Ambulatory Visit | Attending: Internal Medicine | Admitting: Internal Medicine

## 2018-06-05 DIAGNOSIS — E2839 Other primary ovarian failure: Secondary | ICD-10-CM

## 2018-12-31 NOTE — Progress Notes (Signed)
Patient Care Team: Nolene Ebbs, MD as PCP - General (Internal Medicine) Rolm Bookbinder, MD as Consulting Physician (General Surgery) Nicholas Lose, MD as Consulting Physician (Hematology and Oncology) Arloa Koh, MD (Inactive) as Consulting Physician (Radiation Oncology) Thea Silversmith, MD as Consulting Physician (Radiation Oncology) Sylvan Cheese, NP as Nurse Practitioner (Hematology and Oncology)  DIAGNOSIS:    ICD-10-CM   1. Malignant neoplasm of upper-outer quadrant of right breast in female, estrogen receptor positive ([place])  C50.411    Z17.0     SUMMARY OF ONCOLOGIC HISTORY: Oncology History  [name] ([place])  03/20/2015 Mammogram   Right breast mass upper outer quadrant 4-5 mm focus, ultrasound axilla normal-appearing lymph nodes   04/05/2015 Initial Diagnosis   Right breast biopsy 10:00 position: 8 cm from the nipple, invasive ductal carcinoma grade 1, ER/PR positive, HER-2 negative, ratio 1.14, Ki-67 5%   04/05/2015 Clinical Stage   Stage IA: T1a N0   05/12/2015 Surgery   Right lumpectomy: Invasive ductal carcinoma grade 1, 0.9 cm, ADH, margins negative, 0/1 lymph nodes, ER 100%, PR 90%, HER-2 negative ratio 1.1, Ki-67 5%   05/12/2015 Pathologic Stage   Stage IA: T1b N0   05/12/2015 Oncotype testing   RS 20 (13% ROR)   07/03/2015 - 08/16/2015 Radiation Therapy   Adjuvant radiation therapy by Dr. Pablo Ledger: Right breast / 45 Pearline Cables @ 1.8 Pearline Cables per fraction x 25 fractions; right breast boost / 16 Gray at [employer] per fraction x 8 fractions   09/25/2015 -  Anti-estrogen oral therapy   Anastrozole 1 mg daily switched to letrozole 12/30/2017 due to muscle pains   10/17/2015 Survivorship   Survivorship visit completed and copy of care plan given to patient     CHIEF COMPLIANT: Follow-up of right breast cancer on anastrozole  INTERVAL HISTORY: April [name] is a 53 y.o. with above-mentioned history of right  breast cancer treated with lumpectomy, radiation, and who is currently on antiestrogen therapy with anastrozole. I last saw her a year ago. Mammogram on 05/04/18 showed no evidence of malignancy. Bone density scan on 06/05/18 showed osteopenia with a T-score of -1.4. She presents to the clinic today for annual follow-up.   REVIEW OF SYSTEMS:   Constitutional: Denies fevers, chills or abnormal weight loss Eyes: Denies blurriness of vision Ears, nose, mouth, throat, and face: Denies mucositis or sore throat Respiratory: Denies cough, dyspnea or wheezes Cardiovascular: Denies palpitation, chest discomfort Gastrointestinal: Denies nausea, heartburn or change in bowel habits Skin: Denies abnormal skin rashes Lymphatics: Denies new lymphadenopathy or easy bruising Neurological: Denies numbness, tingling or new weaknesses Behavioral/Psych: Mood is stable, no new changes  Extremities: No lower extremity edema Breast: denies any pain or lumps or nodules in either breasts All other systems were reviewed with the patient and are negative.  I have reviewed the past medical history, past surgical history, social history and family history with the patient and they are unchanged from previous note.  ALLERGIES:  is allergic to morphine and related and hydrocodone.  MEDICATIONS:  Current Outpatient Medications  Medication Sig Dispense Refill  . aspirin 81 MG chewable tablet Chew 1 tablet (81 mg total) by mouth daily.    Marland Kitchen atorvastatin (LIPITOR) 20 MG tablet Take 1 tablet (20 mg total) by mouth daily at 6 PM. 30 tablet 2  . diazepam (VALIUM) 5 MG tablet Take 1 tablet (5 mg total) by mouth every 6 (six) hours as needed for anxiety. 30 tablet 0  .  gabapentin (NEURONTIN) 300 MG capsule Take 300 mg by mouth 3 (three) times daily.    Marland Kitchen ibuprofen (ADVIL,MOTRIN) 200 MG tablet Take 200 mg by mouth every 6 (six) hours as needed.    . metoprolol succinate (TOPROL-XL) 50 MG 24 hr tablet Take 50 mg by mouth daily. Take  with or immediately following a meal.    . QUEtiapine Fumarate (SEROQUEL XR) 300 MG 24 hr tablet Take 2 tablets (600 mg total) by mouth daily at 8 pm.    . tamoxifen (NOLVADEX) 20 MG tablet Take 1 tablet (20 mg total) by mouth daily. 90 tablet 3   Current Facility-Administered Medications  Medication Dose Route Frequency Provider Last Rate Last Dose  . 0.9 %  sodium chloride infusion  500 mL Intravenous Continuous Armbruster, Carlota Raspberry, MD        PHYSICAL EXAMINATION: ECOG PERFORMANCE STATUS: 1 - Symptomatic but completely ambulatory  Vitals:   01/01/19 1118  BP: 124/71  Pulse: 83  Resp: 18  Temp: 98.3 F (36.8 C)  SpO2: 98%   Filed Weights   01/01/19 1118  Weight: 225 lb 4.8 oz (102.2 kg)    GENERAL: alert, no distress and comfortable SKIN: skin color, texture, turgor are normal, no rashes or significant lesions EYES: normal, Conjunctiva are pink and non-injected, sclera clear OROPHARYNX: no exudate, no erythema and lips, buccal mucosa, and tongue normal  NECK: supple, thyroid normal size, non-tender, without nodularity LYMPH: no palpable lymphadenopathy in the cervical, axillary or inguinal LUNGS: clear to auscultation and percussion with normal breathing effort HEART: regular rate & rhythm and no murmurs and no lower extremity edema ABDOMEN: abdomen soft, non-tender and normal bowel sounds MUSCULOSKELETAL: no cyanosis of digits and no clubbing  NEURO: alert & oriented x 3 with fluent speech, no focal motor/sensory deficits EXTREMITIES: No lower extremity edema   LABORATORY DATA:  I have reviewed the data as listed CMP Latest Ref Rng & Units 02/08/2018 12/06/2015 04/05/2015  Glucose 70 - 99 mg/dL 124(H) 115 93  BUN 6 - 20 mg/dL 13 12.2 10.1  Creatinine 0.44 - 1.00 mg/dL 0.95 1.0 0.9  Sodium 135 - 145 mmol/L 137 138 140  Potassium 3.5 - 5.1 mmol/L 3.8 4.2 4.1  Chloride 98 - 111 mmol/L 102 - -  CO2 22 - 32 mmol/L '23 22 22  ' Calcium 8.9 - 10.3 mg/dL 9.3 9.2 9.0  Total  Protein 6.4 - 8.3 g/dL - 7.3 6.8  Total Bilirubin 0.20 - 1.20 mg/dL - 0.36 <0.30  Alkaline Phos 40 - 150 U/L - 135 165(H)  AST 5 - 34 U/L - 16 34  ALT 0 - 55 U/L - 22 39    Lab Results  Component Value Date   WBC 11.1 (H) 02/08/2018   HGB 14.8 02/08/2018   HCT 44.5 02/08/2018   MCV 90.6 02/08/2018   PLT 271 02/08/2018   NEUTROABS 7.5 02/08/2018    ASSESSMENT & PLAN:  [name] ([place]) Right lumpectomy 05/12/2015: Invasive ductal carcinoma grade 1, 0.9 cm, ADH, margins negative, 0/1 lymph nodes, T1BN0 stage IA pathologic stage, ER 100%, PR 90%, HER-2 negative ratio 1.1, Ki-67 5% Oncotype DX score 20, 13% risk of recurrence, intermediate risk Completed radiation therapy 08/16/2015 Estradiol: < 5; FSH: 110 Menopausal  Current Treatment: Anastrozole 1 mg daily started 09/25/15 switched to letrozole July 2019 switched to tamoxifen July 2020  Letrozole Toxicities: Severe muscle and bone pain due to antiestrogen therapy.  She cannot tolerate  anastrozole or letrozole.  It is for this reason we are switching her to tamoxifen.  I instructed her to take half a tablet of tamoxifen if she tolerates it well then she will increase to 20 mg.  Some of her aches and pains are related to fibromyalgia in my opinion.  She has very tender spots throughout her body. She cannot tolerate tamoxifen and we will discontinue further antiestrogen therapy. Patient is motivated to take antiestrogen therapy for full duration of 5 years.   Breast Cancer Surveillance: 1. Breast exam July 2020: Benign 2. Mammogram  05/04/2018: No evidence of malignancy breast density category C  Return to clinic in 1 year for follow-up    No orders of the defined types were placed in this encounter.  The patient has a good understanding of the overall plan. she agrees with it. she will call with any problems that may develop before the next visit here.  Nicholas Lose, MD  01/01/2019  Julious Oka Dorshimer am acting as scribe for Dr. Nicholas Lose.  I have reviewed the above documentation for accuracy and completeness, and I agree with the above.

## 2019-01-01 ENCOUNTER — Other Ambulatory Visit: Payer: Self-pay

## 2019-01-01 ENCOUNTER — Inpatient Hospital Stay: Payer: Medicare PPO | Attending: Hematology and Oncology | Admitting: Hematology and Oncology

## 2019-01-01 DIAGNOSIS — Z7982 Long term (current) use of aspirin: Secondary | ICD-10-CM | POA: Diagnosis not present

## 2019-01-01 DIAGNOSIS — Z170 Estrogen receptor positive status [ER+]: Secondary | ICD-10-CM | POA: Diagnosis not present

## 2019-01-01 DIAGNOSIS — Z923 Personal history of irradiation: Secondary | ICD-10-CM | POA: Diagnosis not present

## 2019-01-01 DIAGNOSIS — Z79899 Other long term (current) drug therapy: Secondary | ICD-10-CM | POA: Insufficient documentation

## 2019-01-01 DIAGNOSIS — Z79811 Long term (current) use of aromatase inhibitors: Secondary | ICD-10-CM | POA: Diagnosis not present

## 2019-01-01 DIAGNOSIS — Z791 Long term (current) use of non-steroidal anti-inflammatories (NSAID): Secondary | ICD-10-CM

## 2019-01-01 DIAGNOSIS — C50411 Malignant neoplasm of upper-outer quadrant of right female breast: Secondary | ICD-10-CM | POA: Diagnosis not present

## 2019-01-01 MED ORDER — QUETIAPINE FUMARATE ER 300 MG PO TB24: 600.0000 mg | ORAL_TABLET | Freq: Every day | ORAL | Status: AC

## 2019-01-01 MED ORDER — TAMOXIFEN CITRATE 20 MG PO TABS: 20.0000 mg | ORAL_TABLET | Freq: Every day | ORAL | 3 refills | Status: DC

## 2019-01-01 NOTE — Assessment & Plan Note (Addendum)
Right lumpectomy 05/12/2015: Invasive ductal carcinoma grade 1, 0.9 cm, ADH, margins negative, 0/1 lymph nodes, T1BN0 stage IA pathologic stage, ER 100%, PR 90%, HER-2 negative ratio 1.1, Ki-67 5% Oncotype DX score 20, 13% risk of recurrence, intermediate risk Completed radiation therapy 08/16/2015 Estradiol: < 5; FSH: 110 Menopausal  Current Treatment: Anastrozole 1 mg daily started 09/25/15 switched to letrozole July 2019 switched to tamoxifen July 2020  Letrozole Toxicities: Severe muscle and bone pain due to antiestrogen therapy.  She cannot tolerate anastrozole or letrozole.  It is for this reason we are switching her to tamoxifen.  I instructed her to take half a tablet of tamoxifen if she tolerates it well then she will increase to 20 mg.   Breast Cancer Surveillance: 1. Breast exam July 2020: Benign 2. Mammogram  05/04/2018: No evidence of malignancy breast density category C  Return to clinic in 1 year for follow-up

## 2019-01-10 ENCOUNTER — Other Ambulatory Visit: Payer: Self-pay | Admitting: Hematology and Oncology

## 2019-03-18 ENCOUNTER — Ambulatory Visit: Payer: Medicare PPO | Admitting: Podiatry

## 2019-03-18 ENCOUNTER — Other Ambulatory Visit: Payer: Self-pay

## 2019-03-18 ENCOUNTER — Ambulatory Visit (INDEPENDENT_AMBULATORY_CARE_PROVIDER_SITE_OTHER): Payer: Medicare PPO

## 2019-03-18 ENCOUNTER — Encounter: Payer: Self-pay | Admitting: Podiatry

## 2019-03-18 VITALS — BP 134/78 | HR 93 | Resp 16

## 2019-03-18 DIAGNOSIS — M722 Plantar fascial fibromatosis: Secondary | ICD-10-CM

## 2019-03-18 DIAGNOSIS — G5793 Unspecified mononeuropathy of bilateral lower limbs: Secondary | ICD-10-CM

## 2019-03-18 MED ORDER — METHYLPREDNISOLONE 4 MG PO TBPK: ORAL_TABLET | ORAL | 0 refills | Status: DC

## 2019-03-18 NOTE — Progress Notes (Signed)
Subjective:  Patient ID: April [name], female    DOB: [birth year],  MRN: [id number] HPI Chief Complaint  Patient presents with  . Foot Pain    Medial and lateral sides foot bilateral (R>L) - burning x 6 months, went to PCP-xrayed left foot only - said had arthritis, no initial treatment, f/u visit Rx'd naproxen-taking daily, now having electrical type sensations plantarly  . New Patient (Initial Visit)    53 y.o. female presents with the above complaint.   ROS: She denies fever chills nausea vomiting muscle aches pains calf pain back pain chest pain shortness of breath.  Past Medical History:  Diagnosis Date  . Anxiety   . Arthritis    knees  . Bipolar 1 disorder ([place])   . Breast cancer ([place])    breast cancer right   . Convulsions/seizures ([place]) 03/16/2015   08-27-2014 last seizure  . Depression 05/06/2012  . Depression, major   . History of stroke 09/14/2014  . Hyperlipidemia 05/06/2012  . Hypertension   . Obesity   . Personal history of radiation therapy   . Schizo affective schizophrenia ([place])   . Stroke ([place])    05/02/12, no deficits  . Tobacco abuse 05/06/2012   Past Surgical History:  Procedure Laterality Date  . ABDOMINAL HYSTERECTOMY    . BREAST BIOPSY Left 2007  . BREAST LUMPECTOMY Right 2016  . CHOLECYSTECTOMY    . RADIOACTIVE SEED GUIDED PARTIAL MASTECTOMY WITH AXILLARY SENTINEL LYMPH NODE BIOPSY Right 05/12/2015   no mastectomy  . TEE WITHOUT CARDIOVERSION  05/06/2012   Procedure: TRANSESOPHAGEAL ECHOCARDIOGRAM (TEE);  Surgeon: Larey Dresser, MD;  Location: [facility];  Service: Cardiovascular;  Laterality: N/A;    Current Outpatient Medications:  .  ALBUTEROL IN, Inhale into the lungs., Disp: , Rfl:  .  Calcium Carbonate (CALCIUM 500 PO), Take by mouth., Disp: , Rfl:  .  diclofenac sodium (VOLTAREN) 1 % GEL, Apply topically 4 (four) times daily., Disp: , Rfl:  .  FAMOTIDINE PO, Take by mouth., Disp: , Rfl:  .  Tiotropium Bromide Monohydrate (SPIRIVA  RESPIMAT IN), Inhale into the lungs., Disp: , Rfl:  .  aspirin 81 MG chewable tablet, Chew 1 tablet (81 mg total) by mouth daily., Disp: , Rfl:  .  atorvastatin (LIPITOR) 20 MG tablet, Take 1 tablet (20 mg total) by mouth daily at 6 PM., Disp: 30 tablet, Rfl: 2 .  diazepam (VALIUM) 5 MG tablet, Take 1 tablet (5 mg total) by mouth every 6 (six) hours as needed for anxiety., Disp: 30 tablet, Rfl: 0 .  gabapentin (NEURONTIN) 600 MG tablet, , Disp: , Rfl:  .  methocarbamol (ROBAXIN) 750 MG tablet, Take 750 mg by mouth 3 (three) times daily as needed., Disp: , Rfl:  .  methylPREDNISolone (MEDROL DOSEPAK) 4 MG TBPK tablet, 6 day dose pack - take as directed, Disp: 21 tablet, Rfl: 0 .  metoprolol succinate (TOPROL-XL) 50 MG 24 hr tablet, Take 50 mg by mouth daily. Take with or immediately following a meal., Disp: , Rfl:  .  naproxen (NAPROSYN) 500 MG tablet, , Disp: , Rfl:  .  QUEtiapine Fumarate (SEROQUEL XR) 300 MG 24 hr tablet, Take 2 tablets (600 mg total) by mouth daily at 8 pm., Disp: , Rfl:   Current Facility-Administered Medications:  .  0.9 %  sodium chloride infusion, 500 mL, Intravenous, Continuous, Armbruster, Carlota Raspberry, MD  Allergies  Allergen Reactions  . Morphine And Related Hives and Itching  . Hydrocodone Rash  Review of Systems Objective:   Vitals:   03/18/19 1056  BP: 134/78  Pulse: 93  Resp: 16    General: Well developed, nourished, in no acute distress, alert and oriented x3   Dermatological: Skin is warm, dry and supple bilateral. Nails x 10 are well maintained; remaining integument appears unremarkable at this time. There are no open sores, no preulcerative lesions, no rash or signs of infection present.  Vascular: Dorsalis Pedis artery and Posterior Tibial artery pedal pulses are 2/4 bilateral with immedate capillary fill time. Pedal hair growth present. No varicosities and no lower extremity edema present bilateral.   Neruologic: Grossly intact via light touch  bilateral. Vibratory intact via tuning fork bilateral. Protective threshold with Semmes Wienstein monofilament intact to all pedal sites bilateral. Patellar and Achilles deep tendon reflexes 2+ bilateral. No Babinski or clonus noted bilateral.   Musculoskeletal: No gross boney pedal deformities bilateral. No pain, crepitus, or limitation noted with foot and ankle range of motion bilateral. Muscular strength 5/5 in all groups tested bilateral.  She has pain to palpation medial calcaneal tubercles bilateral lateral pain as well at the fourth and fifth TMT.  Gait: Unassisted, Nonantalgic.    Radiographs:  Radiographs taken today demonstrate no acute findings.  Some mild midfoot osteoarthritic changes.  Soft tissue increase in density plantar fat-containing insertion site.  Assessment & Plan:   Assessment: Plantar fasciitis bilaterally.  Plan: Discussed etiology pathology conservative surgical therapy at this point time performed a injection to the bilateral heel 20 mg Kenalog 5 mg Marcaine point of maximal tenderness.  Tolerated procedure well.     Tedrick [place], [place]

## 2019-04-20 ENCOUNTER — Ambulatory Visit: Payer: Medicare PPO | Admitting: Podiatry

## 2019-05-06 ENCOUNTER — Other Ambulatory Visit: Payer: Self-pay | Admitting: Hematology and Oncology

## 2019-05-06 DIAGNOSIS — Z853 Personal history of malignant neoplasm of breast: Secondary | ICD-10-CM

## 2019-05-17 ENCOUNTER — Other Ambulatory Visit: Payer: Self-pay

## 2019-05-17 ENCOUNTER — Ambulatory Visit
Admission: RE | Admit: 2019-05-17 | Discharge: 2019-05-17 | Disposition: A | Discharge status: Home / Self Care | Payer: Medicare PPO | Source: Ambulatory Visit | Attending: Hematology and Oncology | Admitting: Hematology and Oncology

## 2019-05-17 DIAGNOSIS — Z853 Personal history of malignant neoplasm of breast: Secondary | ICD-10-CM

## 2020-01-02 NOTE — Progress Notes (Signed)
Patient Care Team: Nolene Ebbs, MD as PCP - General (Internal Medicine) Rolm Bookbinder, MD as Consulting Physician (General Surgery) Nicholas Lose, MD as Consulting Physician (Hematology and Oncology) Arloa Koh, MD (Inactive) as Consulting Physician (Radiation Oncology) Thea Silversmith, MD as Consulting Physician (Radiation Oncology) Sylvan Cheese, NP as Nurse Practitioner (Hematology and Oncology)  DIAGNOSIS:    ICD-10-CM   1. Malignant neoplasm of upper-outer quadrant of right breast in female, estrogen receptor positive ([place])  C50.411    Z17.0     SUMMARY OF ONCOLOGIC HISTORY: Oncology History  [name] ([place])  03/20/2015 Mammogram   Right breast mass upper outer quadrant 4-5 mm focus, ultrasound axilla normal-appearing lymph nodes   04/05/2015 Initial Diagnosis   Right breast biopsy 10:00 position: 8 cm from the nipple, invasive ductal carcinoma grade 1, ER/PR positive, HER-2 negative, ratio 1.14, Ki-67 5%   04/05/2015 Clinical Stage   Stage IA: T1a N0   05/12/2015 Surgery   Right lumpectomy: Invasive ductal carcinoma grade 1, 0.9 cm, ADH, margins negative, 0/1 lymph nodes, ER 100%, PR 90%, HER-2 negative ratio 1.1, Ki-67 5%   05/12/2015 Pathologic Stage   Stage IA: T1b N0   05/12/2015 Oncotype testing   RS 20 (13% ROR)   07/03/2015 - 08/16/2015 Radiation Therapy   Adjuvant radiation therapy by Dr. Pablo Ledger: Right breast / 61 Pearline Cables @ 1.8 Pearline Cables per fraction x 25 fractions; right breast boost / 16 Gray at [employer] per fraction x 8 fractions   09/25/2015 -  Anti-estrogen oral therapy   Anastrozole 1 mg daily switched to letrozole 12/30/2017 due to muscle pains   10/17/2015 Survivorship   Survivorship visit completed and copy of care plan given to patient     CHIEF COMPLIANT: Follow-up of right breast cancer on anastrozole  INTERVAL HISTORY: April [name] is a 54 y.o. with above-mentioned history of right  breast cancer treated with lumpectomy, radiation, and took antiestrogen therapy for a few years and discontinued because of adverse effects. Mammogram on 05/17/19 showed no evidence of malignancy. She presents to the clinic today for annual follow-up.   She has lost 18 pounds by watching what she eats. Denies any lumps or nodules in the breast. She was unable to tolerate anastrozole or letrozole and is currently on surveillance.  ALLERGIES:  is allergic to morphine and related and hydrocodone.  MEDICATIONS:  Current Outpatient Medications  Medication Sig Dispense Refill  . ALBUTEROL IN Inhale into the lungs.    Marland Kitchen aspirin 81 MG chewable tablet Chew 1 tablet (81 mg total) by mouth daily.    Marland Kitchen atorvastatin (LIPITOR) 20 MG tablet Take 1 tablet (20 mg total) by mouth daily at 6 PM. 30 tablet 2  . Calcium Carbonate (CALCIUM 500 PO) Take by mouth.    . diazepam (VALIUM) 5 MG tablet Take 1 tablet (5 mg total) by mouth every 6 (six) hours as needed for anxiety. 30 tablet 0  . diclofenac sodium (VOLTAREN) 1 % GEL Apply topically 4 (four) times daily.    Marland Kitchen FAMOTIDINE PO Take by mouth.    . gabapentin (NEURONTIN) 600 MG tablet     . methocarbamol (ROBAXIN) 750 MG tablet Take 750 mg by mouth 3 (three) times daily as needed.    . methylPREDNISolone (MEDROL DOSEPAK) 4 MG TBPK tablet 6 day dose pack - take as directed 21 tablet 0  . metoprolol succinate (TOPROL-XL) 50 MG 24 hr tablet Take 50 mg by mouth daily.  Take with or immediately following a meal.    . naproxen (NAPROSYN) 500 MG tablet     . QUEtiapine Fumarate (SEROQUEL XR) 300 MG 24 hr tablet Take 2 tablets (600 mg total) by mouth daily at 8 pm.    . Tiotropium Bromide Monohydrate (SPIRIVA RESPIMAT IN) Inhale into the lungs.     Current Facility-Administered Medications  Medication Dose Route Frequency Provider Last Rate Last Admin  . 0.9 %  sodium chloride infusion  500 mL Intravenous Continuous Armbruster, Carlota Raspberry, MD        PHYSICAL  EXAMINATION: ECOG PERFORMANCE STATUS: 1 - Symptomatic but completely ambulatory  There were no vitals filed for this visit. There were no vitals filed for this visit.  BREAST: No palpable masses or nodules in either right or left breasts. No palpable axillary supraclavicular or infraclavicular adenopathy no breast tenderness or nipple discharge. (exam performed in the presence of a chaperone)  LABORATORY DATA:  I have reviewed the data as listed CMP Latest Ref Rng & Units 02/08/2018 12/06/2015 04/05/2015  Glucose 70 - 99 mg/dL 124(H) 115 93  BUN 6 - 20 mg/dL 13 12.2 10.1  Creatinine 0.44 - 1.00 mg/dL 0.95 1.0 0.9  Sodium 135 - 145 mmol/L 137 138 140  Potassium 3.5 - 5.1 mmol/L 3.8 4.2 4.1  Chloride 98 - 111 mmol/L 102 - -  CO2 22 - 32 mmol/L _0 Calcium 8.9 - 10.3 mg/dL 9.3 9.2 9.0  Total Protein 6.4 - 8.3 g/dL - 7.3 6.8  Total Bilirubin 0.20 - 1.20 mg/dL - 0.36 <0.30  Alkaline Phos 40 - 150 U/L - 135 165(H)  AST 5 - 34 U/L - 16 34  ALT 0 - 55 U/L - 22 39    Lab Results  Component Value Date   WBC 11.1 (H) 02/08/2018   HGB 14.8 02/08/2018   HCT 44.5 02/08/2018   MCV 90.6 02/08/2018   PLT 271 02/08/2018   NEUTROABS 7.5 02/08/2018    ASSESSMENT & PLAN:  [name] ([place]) Right lumpectomy 05/12/2015: Invasive ductal carcinoma grade 1, 0.9 cm, ADH, margins negative, 0/1 lymph nodes, T1BN0 stage IA pathologic stage, ER 100%, PR 90%, HER-2 negative ratio 1.1, Ki-67 5% Oncotype DX score 20, 13% risk of recurrence, intermediate risk Completed radiation therapy 08/16/2015 Estradiol: < 5; FSH: 110 Menopausal  Current Treatment: Anastrozole 1 mg daily started 09/25/15 switched to letrozole July 2019 switched to tamoxifen July 2020 stopped 2020 Patient has lost almost 18 pounds by watching what she eats. I encouraged her to walk every day for 30 minutes.   Breast Cancer Surveillance: 1. Breast exam 01/03/2020:Benign 2.  Mammogram12/14/2020: No evidence of malignancy breast density category B  Return to clinic in 1 year for follow-up   No orders of the defined types were placed in this encounter.  The patient has a good understanding of the overall plan. she agrees with it. she will call with any problems that may develop before the next visit here.  Total time spent: 20 mins including face to face time and time spent for planning, charting and coordination of care  Nicholas Lose, MD 01/03/2020  I, Cloyde Reams Dorshimer, am acting as scribe for Dr. Nicholas Lose.  I have reviewed the above documentation for accuracy and completeness, and I moved her all the appointments after the PET scan with the above.

## 2020-01-03 ENCOUNTER — Telehealth: Payer: Self-pay | Admitting: Hematology and Oncology

## 2020-01-03 ENCOUNTER — Inpatient Hospital Stay: Payer: Medicare PPO | Attending: Hematology and Oncology | Admitting: Hematology and Oncology

## 2020-01-03 ENCOUNTER — Other Ambulatory Visit: Payer: Self-pay

## 2020-01-03 DIAGNOSIS — Z923 Personal history of irradiation: Secondary | ICD-10-CM | POA: Insufficient documentation

## 2020-01-03 DIAGNOSIS — C50411 Malignant neoplasm of upper-outer quadrant of right female breast: Secondary | ICD-10-CM | POA: Diagnosis not present

## 2020-01-03 DIAGNOSIS — Z79811 Long term (current) use of aromatase inhibitors: Secondary | ICD-10-CM | POA: Insufficient documentation

## 2020-01-03 DIAGNOSIS — Z79899 Other long term (current) drug therapy: Secondary | ICD-10-CM | POA: Diagnosis not present

## 2020-01-03 DIAGNOSIS — Z7952 Long term (current) use of systemic steroids: Secondary | ICD-10-CM | POA: Insufficient documentation

## 2020-01-03 DIAGNOSIS — Z7982 Long term (current) use of aspirin: Secondary | ICD-10-CM | POA: Insufficient documentation

## 2020-01-03 DIAGNOSIS — Z170 Estrogen receptor positive status [ER+]: Secondary | ICD-10-CM | POA: Diagnosis not present

## 2020-01-03 NOTE — Assessment & Plan Note (Addendum)
Right lumpectomy 05/12/2015: Invasive ductal carcinoma grade 1, 0.9 cm, ADH, margins negative, 0/1 lymph nodes, T1BN0 stage IA pathologic stage, ER 100%, PR 90%, HER-2 negative ratio 1.1, Ki-67 5% Oncotype DX score 20, 13% risk of recurrence, intermediate risk Completed radiation therapy 08/16/2015 Estradiol: < 5; FSH: 110 Menopausal  Current Treatment: Anastrozole 1 mg daily started 09/25/15 switched to letrozole July 2019 switched to tamoxifen July 2020 stopped 2021    Breast Cancer Surveillance: 1. Breast exam 01/03/2020:Benign 2. Mammogram12/14/2020: No evidence of malignancy breast density category B  Return to clinic in 1 year for follow-up

## 2020-01-03 NOTE — Telephone Encounter (Signed)
Scheduled appts per 8/2 los. Gave pt a print out of appt calendar.

## 2020-01-18 ENCOUNTER — Other Ambulatory Visit: Payer: Self-pay

## 2020-01-18 ENCOUNTER — Ambulatory Visit (AMBULATORY_SURGERY_CENTER): Payer: Self-pay | Admitting: *Deleted

## 2020-01-18 VITALS — Ht 63.0 in | Wt 198.0 lb

## 2020-01-18 DIAGNOSIS — Z86010 Personal history of colonic polyps: Secondary | ICD-10-CM

## 2020-01-18 DIAGNOSIS — Z01818 Encounter for other preprocedural examination: Secondary | ICD-10-CM

## 2020-01-18 MED ORDER — SUTAB 1479-225-188 MG PO TABS: 24.0000 | ORAL_TABLET | ORAL | 0 refills | Status: AC

## 2020-01-18 NOTE — Progress Notes (Signed)
cov test 8-30 at 1030 am   No egg or soy allergy known to patient  No issues with past sedation with any surgeries or procedures no intubation problems in the past  No FH of Malignant Hyperthermia No diet pills per patient No home 02 use per patient  No blood thinners per patient  Pt denies issues with constipation  No A fib or A flutter  EMMI video to pt or via MyChart  COVID 19 guidelines implemented in [facility] today with Pt and RN   [employer] Code to [facility], coupon to pt in [facility]    Due to the COVID-19 pandemic we are asking patients to follow these guidelines. Please only bring one care partner. Please be aware that your care partner may wait in the car in the parking lot or if they feel like they will be too hot to wait in the car, they may wait in the lobby on the 4th floor. All care partners are required to wear a mask the entire time (we do not have any that we can provide them), they need to practice social distancing, and we will do a Covid check for all patient's and care partners when you arrive. Also we will check their temperature and your temperature. If the care partner waits in their car they need to stay in the parking lot the entire time and we will call them on their cell phone when the patient is ready for discharge so they can bring the car to the front of the building. Also all patient's will need to wear a mask into building.

## 2020-01-31 ENCOUNTER — Ambulatory Visit (INDEPENDENT_AMBULATORY_CARE_PROVIDER_SITE_OTHER): Payer: Medicare PPO

## 2020-01-31 ENCOUNTER — Other Ambulatory Visit: Payer: Self-pay | Admitting: Gastroenterology

## 2020-01-31 DIAGNOSIS — Z1159 Encounter for screening for other viral diseases: Secondary | ICD-10-CM

## 2020-02-01 ENCOUNTER — Encounter: Payer: Self-pay | Admitting: Certified Registered Nurse Anesthetist

## 2020-02-01 LAB — SARS CORONAVIRUS 2 (TAT 6-24 HRS): SARS Coronavirus 2: NEGATIVE

## 2020-02-02 ENCOUNTER — Ambulatory Visit (AMBULATORY_SURGERY_CENTER): Payer: Medicare PPO | Admitting: Gastroenterology

## 2020-02-02 ENCOUNTER — Other Ambulatory Visit: Payer: Self-pay

## 2020-02-02 ENCOUNTER — Encounter: Payer: Self-pay | Admitting: Gastroenterology

## 2020-02-02 VITALS — BP 128/77 | HR 65 | Temp 98.3°F | Resp 19 | Ht 63.0 in | Wt 198.0 lb

## 2020-02-02 DIAGNOSIS — Z86010 Personal history of colonic polyps: Secondary | ICD-10-CM

## 2020-02-02 DIAGNOSIS — D123 Benign neoplasm of transverse colon: Secondary | ICD-10-CM

## 2020-02-02 DIAGNOSIS — K635 Polyp of colon: Secondary | ICD-10-CM

## 2020-02-02 MED ORDER — SODIUM CHLORIDE 0.9 % IV SOLN: 500.0000 mL | INTRAVENOUS | Status: DC

## 2020-02-02 NOTE — Op Note (Signed)
[facility] Patient Name: April [name] Procedure Date: 02/02/2020 11:23 AM MRN: [id number] Endoscopist: Remo Lipps P. Havery Moros , MD Age: 54 Referring MD:  Date of Birth: Mar 20, 1966 Gender: Female Account #: [id number] Procedure:                Colonoscopy Indications:              High risk colon cancer surveillance: Personal                            history of colonic polyps (8 polyps removed 2018) Medicines:                Monitored Anesthesia Care Procedure:                Pre-Anesthesia Assessment:                           - Prior to the procedure, a History and Physical                            was performed, and patient medications and                            allergies were reviewed. The patient's tolerance of                            previous anesthesia was also reviewed. The risks                            and benefits of the procedure and the sedation                            options and risks were discussed with the patient.                            All questions were answered, and informed consent                            was obtained. Prior Anticoagulants: The patient has                            taken no previous anticoagulant or antiplatelet                            agents. ASA Grade Assessment: III - A patient with                            severe systemic disease. After reviewing the risks                            and benefits, the patient was deemed in                            satisfactory condition to undergo the procedure.  After obtaining informed consent, the colonoscope                            was passed under direct vision. Throughout the                            procedure, the patient's blood pressure, pulse, and                            oxygen saturations were monitored continuously. The                            Colonoscope was introduced through the anus and                            advanced  to the the cecum, identified by                            appendiceal orifice and ileocecal valve. The                            colonoscopy was performed without difficulty. The                            patient tolerated the procedure well. The quality                            of the bowel preparation was good. The ileocecal                            valve, appendiceal orifice, and rectum were                            photographed. Scope In: 11:31:04 AM Scope Out: 11:48:16 AM Scope Withdrawal Time: 0 hours 15 minutes 4 seconds  Total Procedure Duration: 0 hours 17 minutes 12 seconds  Findings:                 The perianal and digital rectal examinations were                            normal.                           A 3 mm polyp was found in the hepatic flexure. The                            polyp was sessile. The polyp was removed with a                            cold snare. Resection and retrieval were complete.                           Internal hemorrhoids were found during retroflexion.  The exam was otherwise without abnormality. Complications:            No immediate complications. Estimated blood loss:                            Minimal. Estimated Blood Loss:     Estimated blood loss was minimal. Impression:               - One 3 mm polyp at the hepatic flexure, removed                            with a cold snare. Resected and retrieved.                           - Internal hemorrhoids.                           - The examination was otherwise normal. Recommendation:           - Patient has a contact number available for                            emergencies. The signs and symptoms of potential                            delayed complications were discussed with the                            patient. Return to normal activities tomorrow.                            Written discharge instructions were provided to the                             patient.                           - Resume previous diet.                           - Continue present medications.                           - Await pathology results. Remo Lipps P. Howie Rufus, MD 02/02/2020 11:52:11 AM This report has been signed electronically.

## 2020-02-02 NOTE — Patient Instructions (Signed)
Information on polyps and hemorrhoids given to you today.  Await pathology results.  Resume previous diet and medications.   YOU HAD AN ENDOSCOPIC PROCEDURE TODAY AT THE [facility]:   Refer to the procedure report that was given to you for any specific questions about what was found during the examination.  If the procedure report does not answer your questions, please call your gastroenterologist to clarify.  If you requested that your care partner not be given the details of your procedure findings, then the procedure report has been included in a sealed envelope for you to review at your convenience later.  YOU SHOULD EXPECT: Some feelings of bloating in the abdomen. Passage of more gas than usual.  Walking can help get rid of the air that was put into your GI tract during the procedure and reduce the bloating. If you had a lower endoscopy (such as a colonoscopy or flexible sigmoidoscopy) you may notice spotting of blood in your stool or on the toilet paper. If you underwent a bowel prep for your procedure, you may not have a normal bowel movement for a few days.  Please Note:  You might notice some irritation and congestion in your nose or some drainage.  This is from the oxygen used during your procedure.  There is no need for concern and it should clear up in a day or so.  SYMPTOMS TO REPORT IMMEDIATELY:  Following lower endoscopy (colonoscopy or flexible sigmoidoscopy):  Excessive amounts of blood in the stool  Significant tenderness or worsening of abdominal pains  Swelling of the abdomen that is new, acute  Fever of 100F or higher   For urgent or emergent issues, a gastroenterologist can be reached at any hour by calling [phone]. Do not use MyChart messaging for urgent concerns.    DIET:  We do recommend a small meal at first, but then you may proceed to your regular diet.  Drink plenty of fluids but you should avoid alcoholic beverages for 24  hours.  ACTIVITY:  You should plan to take it easy for the rest of today and you should NOT DRIVE or use heavy machinery until tomorrow (because of the sedation medicines used during the test).    FOLLOW UP: Our staff will call the number listed on your records 48-72 hours following your procedure to check on you and address any questions or concerns that you may have regarding the information given to you following your procedure. If we do not reach you, we will leave a message.  We will attempt to reach you two times.  During this call, we will ask if you have developed any symptoms of COVID 19. If you develop any symptoms (ie: fever, flu-like symptoms, shortness of breath, cough etc.) before then, please call [phone].  If you test positive for Covid 19 in the 2 weeks post procedure, please call and report this information to us.    If any biopsies were taken you will be contacted by phone or by letter within the next 1-3 weeks.  Please call us at [phone] if you have not heard about the biopsies in 3 weeks.    SIGNATURES/CONFIDENTIALITY: You and/or your care partner have signed paperwork which will be entered into your electronic medical record.  These signatures attest to the fact that that the information above on your After Visit Summary has been reviewed and is understood.  Full responsibility of the confidentiality of this discharge information lies with you and/or   your care-partner. 

## 2020-02-02 NOTE — Progress Notes (Signed)
Vs CW I have reviewed the patient's medical history in detail and updated the computerized patient record.   

## 2020-02-02 NOTE — Progress Notes (Signed)
Report given to PACU, vss 

## 2020-02-02 NOTE — Progress Notes (Signed)
Called to room to assist during endoscopic procedure.  Patient ID and intended procedure confirmed with present staff. Received instructions for my participation in the procedure from the performing physician.  

## 2020-02-04 ENCOUNTER — Telehealth: Payer: Self-pay | Admitting: *Deleted

## 2020-02-04 NOTE — Telephone Encounter (Signed)
  Follow up Call-  Call back number 02/02/2020  Post procedure Call Back phone  # [phone]  Permission to leave phone message Yes  Some recent data might be hidden     Patient questions:  Do you have a fever, pain , or abdominal swelling? No. Pain Score  0 *  Have you tolerated food without any problems? Yes.    Have you been able to return to your normal activities? Yes.    Do you have any questions about your discharge instructions: Diet   No. Medications  No. Follow up visit  No.  Do you have questions or concerns about your Care? No.  Actions: * If pain score is 4 or above: 1. No action needed, pain <4.Have you developed a fever since your procedure? no  2.   Have you had an respiratory symptoms (SOB or cough) since your procedure? no  3.   Have you tested positive for COVID 19 since your procedure no  4.   Have you had any family members/close contacts diagnosed with the COVID 19 since your procedure?  no   If yes to any of these questions please route to Joylene John, RN and Joella Prince, RN

## 2020-02-11 ENCOUNTER — Encounter: Payer: Self-pay | Admitting: Gastroenterology

## 2020-06-08 ENCOUNTER — Other Ambulatory Visit: Payer: Self-pay | Admitting: Hematology and Oncology

## 2020-06-08 DIAGNOSIS — Z853 Personal history of malignant neoplasm of breast: Secondary | ICD-10-CM

## 2020-06-09 ENCOUNTER — Ambulatory Visit
Admission: RE | Admit: 2020-06-09 | Discharge: 2020-06-09 | Disposition: A | Discharge status: Home / Self Care | Payer: Medicare PPO | Source: Ambulatory Visit | Attending: Hematology and Oncology | Admitting: Hematology and Oncology

## 2020-06-09 ENCOUNTER — Other Ambulatory Visit: Payer: Self-pay

## 2020-06-09 DIAGNOSIS — Z853 Personal history of malignant neoplasm of breast: Secondary | ICD-10-CM

## 2020-08-28 ENCOUNTER — Other Ambulatory Visit: Payer: Self-pay | Admitting: Internal Medicine

## 2020-08-28 DIAGNOSIS — E2839 Other primary ovarian failure: Secondary | ICD-10-CM

## 2021-01-01 NOTE — Assessment & Plan Note (Deleted)
Right lumpectomy 05/12/2015: Invasive ductal carcinoma grade 1, 0.9 cm, ADH, margins negative, 0/1 lymph nodes, T1BN0 stage IA pathologic stage, ER 100%, PR 90%, HER-2 negative ratio 1.1, Ki-67 5% Oncotype DX score 20, 13% risk of recurrence, intermediate risk Completed radiation therapy 08/16/2015 Estradiol: < 5; FSH: 110 Menopausal  Current Treatment: Anastrozole 1 mg daily started 4/24/17switched to letrozole July 2019switched to tamoxifen July 2020 stopped 2020 Patient has lost almost 18 pounds by watching what she eats. I encouraged her to walk every day for 30 minutes.  Breast Cancer Surveillance: 1. Breast exam 01/02/2021:Benign 2. Mammogram1/7/22: No evidence of malignancy breast density category B  Return to clinic in 1 year for follow-up

## 2021-01-02 ENCOUNTER — Ambulatory Visit: Payer: Medicare PPO | Admitting: Hematology and Oncology

## 2021-01-02 DIAGNOSIS — Z170 Estrogen receptor positive status [ER+]: Secondary | ICD-10-CM

## 2021-03-11 ENCOUNTER — Emergency Department (HOSPITAL_BASED_OUTPATIENT_CLINIC_OR_DEPARTMENT_OTHER)
Admission: EM | Admit: 2021-03-11 | Discharge: 2021-03-11 | Discharge status: Against Medical Advice | Payer: Medicare PPO

## 2021-03-11 NOTE — ED Notes (Signed)
Pt discharged after 3 attempts to call back for triage

## 2021-03-14 ENCOUNTER — Inpatient Hospital Stay: Admission: RE | Admit: 2021-03-14 | Payer: Medicare PPO | Source: Ambulatory Visit

## 2021-06-11 ENCOUNTER — Other Ambulatory Visit: Payer: Self-pay | Admitting: Internal Medicine

## 2021-06-11 DIAGNOSIS — Z1231 Encounter for screening mammogram for malignant neoplasm of breast: Secondary | ICD-10-CM

## 2021-06-12 ENCOUNTER — Ambulatory Visit
Admission: RE | Admit: 2021-06-12 | Discharge: 2021-06-12 | Disposition: A | Discharge status: Home / Self Care | Payer: Medicare PPO | Source: Ambulatory Visit | Attending: Internal Medicine | Admitting: Internal Medicine

## 2021-06-12 ENCOUNTER — Other Ambulatory Visit: Payer: Self-pay

## 2021-06-12 DIAGNOSIS — Z1231 Encounter for screening mammogram for malignant neoplasm of breast: Secondary | ICD-10-CM

## 2022-01-11 ENCOUNTER — Other Ambulatory Visit: Payer: Self-pay

## 2022-01-11 ENCOUNTER — Emergency Department (HOSPITAL_BASED_OUTPATIENT_CLINIC_OR_DEPARTMENT_OTHER)
Admission: EM | Admit: 2022-01-11 | Discharge: 2022-01-11 | Disposition: A | Discharge status: Home / Self Care | Payer: Medicare PPO | Attending: Emergency Medicine | Admitting: Emergency Medicine

## 2022-01-11 ENCOUNTER — Encounter (HOSPITAL_BASED_OUTPATIENT_CLINIC_OR_DEPARTMENT_OTHER): Payer: Self-pay

## 2022-01-11 ENCOUNTER — Emergency Department (HOSPITAL_BASED_OUTPATIENT_CLINIC_OR_DEPARTMENT_OTHER): Payer: Medicare PPO

## 2022-01-11 DIAGNOSIS — Z79899 Other long term (current) drug therapy: Secondary | ICD-10-CM | POA: Insufficient documentation

## 2022-01-11 DIAGNOSIS — F17200 Nicotine dependence, unspecified, uncomplicated: Secondary | ICD-10-CM | POA: Diagnosis not present

## 2022-01-11 DIAGNOSIS — M545 Low back pain, unspecified: Secondary | ICD-10-CM | POA: Diagnosis present

## 2022-01-11 DIAGNOSIS — R109 Unspecified abdominal pain: Secondary | ICD-10-CM | POA: Diagnosis not present

## 2022-01-11 DIAGNOSIS — Z7982 Long term (current) use of aspirin: Secondary | ICD-10-CM | POA: Insufficient documentation

## 2022-01-11 DIAGNOSIS — I10 Essential (primary) hypertension: Secondary | ICD-10-CM | POA: Diagnosis not present

## 2022-01-11 LAB — URINALYSIS, ROUTINE W REFLEX MICROSCOPIC
Bilirubin Urine: NEGATIVE
Glucose, UA: NEGATIVE mg/dL
Ketones, ur: NEGATIVE mg/dL
Leukocytes,Ua: NEGATIVE
Nitrite: NEGATIVE
Protein, ur: NEGATIVE mg/dL
Specific Gravity, Urine: 1.014 (ref 1.005–1.030)
pH: 5 (ref 5.0–8.0)

## 2022-01-11 MED ORDER — METHOCARBAMOL 500 MG PO TABS: 500.0000 mg | ORAL_TABLET | Freq: Every evening | ORAL | 0 refills | Status: AC

## 2022-01-11 MED ORDER — NAPROXEN 250 MG PO TABS
500.0000 mg | ORAL_TABLET | Freq: Once | ORAL | Status: AC
  Administered 2022-01-11: 500 mg via ORAL
  Filled 2022-01-11: qty 2

## 2022-01-11 MED ORDER — NAPROXEN 500 MG PO TABS: 500.0000 mg | ORAL_TABLET | Freq: Two times a day (BID) | ORAL | 0 refills | Status: AC

## 2022-01-11 MED ORDER — LIDOCAINE 4 % EX PTCH: 1.0000 | MEDICATED_PATCH | CUTANEOUS | 0 refills | Status: AC

## 2022-01-11 MED ORDER — LIDOCAINE 5 % EX PTCH
1.0000 | MEDICATED_PATCH | Freq: Once | CUTANEOUS | Status: DC
  Administered 2022-01-11: 1 via TRANSDERMAL
  Filled 2022-01-11: qty 1

## 2022-01-11 NOTE — Discharge Instructions (Addendum)
You have been provided the contact information for a local orthopedic specialist by the name of Dr. Doreatha Martin.  Please call to schedule a follow-up appointment within the next 2 to 3 days for reevaluation continue medical management.  You have been prescribed an anti-inflammatory by the name of naproxen.  You may take 1 tablet every 12 hours as needed for pain relief.  Always take with plenty of food and water.  Do not take ibuprofen or Motrin on top of this, stay on same medication family.  Though you may take Tylenol in addition to the naproxen.  Please take 1 tablet of Robaxin at night before bed for additional pain relief.  This is a muscle relaxer, it may cause generalized weakness, sleepiness and you should not drive or do important things while taking this medication.  If the drowsiness is more than you can tolerate, you may stop taking this medication at any time.  I have also sent a prescription for lidocaine patches, though you may request the over-the-counter patches that are lower in percentage but just as effective.  Return to the ED for new or worsening symptoms as discussed.

## 2022-01-11 NOTE — ED Triage Notes (Signed)
Patient here POV from Home.  Endorses Left Sided Lower Back Pain for approximately 3-4 Months. Patient believed it was originally a Renal Stone.   No N/V/D. No Dysuria. No Acute Trauma or Injury.   NAD Noted during Triage. A&Ox4. GCS 15. Ambulatory.

## 2022-01-11 NOTE — ED Provider Notes (Signed)
[facility] EM[facility]ENC[facility] DEPT Provider Note   CSN: [id number] Arrival date & time: 01/11/22  1307     History  Chief Complaint  Patient presents with   Back Pain    April [name] is a 56 y.o. female with history of HTN, schizophrenia, hyperlipidemia, depression, anxiety, arthritis, obesity, tobacco use.  Presenting today with left lower back pain for the past 4 months.  Pain waxes and wanes, radiates down the left outer leg.  Denies urinary symptoms, though believes it may be a kidney stone.  No changes in bowel habits, abdominal pain, N/V, fever, chills, or vaginal symptoms.  No preceding injury.  States she has arthritis of most of her joints, also believes pain may be due to some form of arthritis.  Denies weakness, numbness, or tingling of the lower extremities.  Denies saddle anesthesia or bowel/urinary incontinence.  No recent cancer treatment or diagnosis, hemoptysis, IVDU, or fevers.  The history is provided by the patient and medical records.  Back Pain      Home Medications Prior to Admission medications   Medication Sig Start Date End Date Taking? Authorizing Provider  lidocaine 4 % Place 1 patch onto the skin daily. 01/11/22  Yes Prince Rome, PA-C  methocarbamol (ROBAXIN) 500 MG tablet Take 1 tablet (500 mg total) by mouth at bedtime for 7 days. 01/11/22 01/18/22 Yes Prince Rome, PA-C  naproxen (NAPROSYN) 500 MG tablet Take 1 tablet (500 mg total) by mouth 2 (two) times daily. 01/11/22  Yes Prince Rome, PA-C  ALBUTEROL IN Inhale into the lungs. Patient not taking: Reported on 02/02/2020    [provider]  aspirin 81 MG chewable tablet Chew 1 tablet (81 mg total) by mouth daily. 05/14/12   Orson Eva, MD  atorvastatin (LIPITOR) 20 MG tablet Take 1 tablet (20 mg total) by mouth daily at 6 PM. 05/14/12   Tat, Shanon Brow, MD  Calcium Carbonate (CALCIUM 500 PO) Take by mouth. Patient not taking: Reported on 02/02/2020    [provider]  carbamazepine (TEGRETOL) 200 MG tablet  10/14/19   [provider]  diazepam (VALIUM) 5 MG tablet Take 1 tablet (5 mg total) by mouth every 6 (six) hours as needed for anxiety. 12/30/17   Nicholas Lose, MD  diclofenac sodium (VOLTAREN) 1 % GEL Apply topically 4 (four) times daily. Patient not taking: Reported on 02/02/2020    [provider]  FAMOTIDINE PO Take by mouth. Patient not taking: Reported on 02/02/2020    [provider]  gabapentin (NEURONTIN) 400 MG capsule  12/14/19   [provider]  gabapentin (NEURONTIN) 600 MG tablet  03/10/19   [provider]  lamoTRIgine (LAMICTAL) 100 MG tablet  11/25/19   [provider]  methylPREDNISolone (MEDROL DOSEPAK) 4 MG TBPK tablet 6 day dose pack - take as directed Patient not taking: Reported on 02/02/2020 03/18/19   Hyatt, Max T, DPM  metoprolol succinate (TOPROL-XL) 50 MG 24 hr tablet Take 50 mg by mouth daily. Take with or immediately following a meal.    [provider]  QUEtiapine Fumarate (SEROQUEL XR) 300 MG 24 hr tablet Take 2 tablets (600 mg total) by mouth daily at 8 pm. 01/01/19   Nicholas Lose, MD  Sodium Sulfate-Mag Sulfate-KCl (SUTAB) [phone] MG TABS Take 24 tablets by mouth as directed. 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 Kara Dies: [id number] PCN: CN GROUP: [id number] MEMBER ID: [id number];NAT AS CASH;NO PRIOR AUTHORIZATION 01/18/20   Armbruster, Carlota Raspberry, MD  Tiotropium Bromide Monohydrate (SPIRIVA  RESPIMAT IN) Inhale into the lungs. Patient not taking: Reported on 02/02/2020    [provider]      Allergies    Morphine and related and Hydrocodone    Review of Systems   Review of Systems  Musculoskeletal:  Positive for back pain.    Physical Exam Updated Vital Signs BP (!) 140/93 (BP Location: Left Arm)   Pulse 86   Temp 98.1 F (36.7 C)   Resp 20   Ht '5\' 3"'$  (1.6 m)   Wt 89.8 kg   SpO2 96%   BMI 35.07 kg/m  Physical Exam Vitals and nursing note reviewed.   Constitutional:      General: She is not in acute distress.    Appearance: She is well-developed. She is obese. She is not ill-appearing, toxic-appearing or diaphoretic.  HENT:     Head: Normocephalic and atraumatic.  Eyes:     Conjunctiva/sclera: Conjunctivae normal.  Neck:     Comments: Very supple on exam.  No meningismus.  No midline tenderness. Cardiovascular:     Rate and Rhythm: Normal rate and regular rhythm.     Heart sounds: No murmur heard. Pulmonary:     Effort: Pulmonary effort is normal. No respiratory distress.     Breath sounds: Normal breath sounds.  Abdominal:     Palpations: Abdomen is soft.     Tenderness: There is no abdominal tenderness.  Musculoskeletal:        General: Tenderness present. No swelling.     Cervical back: Neck supple. No rigidity.     Right lower leg: No edema.     Left lower leg: No edema.     Comments: Left lower lumbar tenderness.  No midline or paraspinal thoracic or lumbar tenderness.  ROM of back and hips appears grossly intact.  No tenderness of the hip bursa or femoral head.  No SI tenderness.  Positive SLR of left, negative right.  No appreciated bony deformity, crepitus, rash, erythema, ecchymosis.  Skin:    General: Skin is warm and dry.     Capillary Refill: Capillary refill takes less than 2 seconds.  Neurological:     Mental Status: She is alert and oriented to person, place, and time.     Comments: No saddle anesthesia.  Psychiatric:        Mood and Affect: Mood normal.     ED Results / Procedures / Treatments   Labs (all labs ordered are listed, but only abnormal results are displayed) Labs Reviewed  URINALYSIS, ROUTINE W REFLEX MICROSCOPIC - Abnormal; Notable for the following components:      Result Value   Hgb urine dipstick TRACE (*)    Bacteria, UA RARE (*)    All other components within normal limits    EKG None  Radiology CT Renal Stone Study  Result Date: 01/11/2022 CLINICAL DATA:  Flank pain, kidney  stone suspected. Endorses Left Sided Lower Back Pain for approximately 3-4 Months. Patient believed it was originally a Renal Stone. No N/V/D. No Dysuria. No Acute Trauma or Injury. EXAM: CT ABDOMEN AND PELVIS WITHOUT CONTRAST TECHNIQUE: Multidetector CT imaging of the abdomen and pelvis was performed following the standard protocol without IV contrast. RADIATION DOSE REDUCTION: This exam was performed according to the departmental dose-optimization program which includes automated exposure control, adjustment of the mA and/or kV according to patient size and/or use of iterative reconstruction technique. COMPARISON:  CT abdomen pelvis 04/03/2009 FINDINGS: Lower chest: Partially visualized right lower lobe 6 mm  ground-glass airspace opacity (4:1). Hepatobiliary: No focal liver abnormality. Status post cholecystectomy. No biliary dilatation. Pancreas: No focal lesion. Normal pancreatic contour. No surrounding inflammatory changes. No main pancreatic ductal dilatation. Spleen: Normal in size without focal abnormality. Adrenals/Urinary Tract: No adrenal nodule bilaterally. No nephrolithiasis and no hydronephrosis. No definite contour-deforming renal mass. No ureterolithiasis or hydroureter. The urinary bladder is unremarkable. Stomach/Bowel: Stomach is within normal limits. No evidence of bowel wall thickening or dilatation. The appendix measures borderline enlarged (8 mm) with no findings of associated inflammatory changes within the right lower quadrant. Vascular/Lymphatic: No abdominal aorta or iliac aneurysm. Mild to moderate atherosclerotic plaque of the aorta and its branches. No abdominal, pelvic, or inguinal lymphadenopathy. Reproductive: Status post hysterectomy. No adnexal masses. Other: No intraperitoneal free fluid. No intraperitoneal free gas. No organized fluid collection. Musculoskeletal: No abdominal wall hernia or abnormality. No suspicious lytic or blastic osseous lesions. No acute displaced fracture.  IMPRESSION: 1. No acute intra-abdominal or intrapelvic abnormality to explain etiology of patient's symptoms. 2.  Aortic Atherosclerosis (ICD10-I70.0). Electronically Signed   By: Iven Finn M.D.   On: 01/11/2022 15:07    Procedures Procedures    Medications Ordered in ED Medications  naproxen (NAPROSYN) tablet 500 mg (500 mg Oral Given 01/11/22 1859)    ED Course/ Medical Decision Making/ A&P                           Medical Decision Making Amount and/or Complexity of Data Reviewed Labs: ordered. Radiology: ordered.  Risk OTC drugs. Prescription drug management.   56 y.o. female presents to the ED for concern of Back Pain   This involves an extensive number of treatment options, and is a complaint that carries with it a high risk of complications and morbidity.    Past Medical History / Co-morbidities / Social History: Hx of HTN, schizophrenia, hyperlipidemia, depression, anxiety, arthritis, obesity, tobacco use Social Determinants of Health include: Chronic tobacco use, for cessation counseling is provided  Additional History:  None  Lab Tests: I ordered, and personally interpreted labs.  The pertinent results include:   UA: unremarkable  Imaging Studies: I ordered imaging studies including CT renal study.   I independently visualized and interpreted imaging which showed negative for obstructing stone or other abdominopelvic pathology I agree with the radiologist interpretation.  ED Course: Pt well-appearing on exam.  Patient with worsening chronic back pain.  No neurological deficits other than positive SLR of left lower extremity.  Normal neuro exam as described above.  Without midline spinal tenderness.  CT renal study negative for acute pathology.  Without urinary symptoms or GI symptoms.  Abdomen soft and nontender.  Due to duration of symptoms, presentation, history, and clinical findings, low suspicion for renal calculi, spinal fracture or spinal  dislocation.  Patient can walk but states is painful.  No loss of bowel or bladder control, or saddle anesthesia.  Low concern for cauda equina, discitis, or infectious spinal process.  No fever, night sweats, weight loss, h/o cancer, IVDU.  Lower extremities appear neurovascularly intact.  ROM mildly limited due to elicited pain.  Clinical suspicion of muscle spasm vs sciatica. Pain managed in ED.  Heat therapy and conservative symptom mangagement discussed with patient.  Naproxen, lidocaine patches, and Robaxin sent to pharmacy.  Advised use of heat therapy and lidocaine patches separately.  Recommend close follow up with PCP/Orthopedics.  Pt satisfied with encounter.  Patient in NAD and in good condition at  time of discharge.  Disposition: After consideration the patient's encounter today, I do not feel today's workup suggests an emergent condition requiring admission or immediate intervention beyond what has been performed at this time.  Safe for discharge; instructed to return immediately for worsening symptoms, change in symptoms or any other concerns.  I have reviewed the patients home medicines and have made adjustments as needed.  Discussed course of treatment with the patient, whom demonstrated understanding.  Patient in agreement and has no further questions.     This chart was dictated using voice recognition software.  Despite best efforts to proofread, errors can occur which can change the documentation meaning.         Final Clinical Impression(s) / ED Diagnoses Final diagnoses:  Acute left-sided low back pain, unspecified whether sciatica present    Rx / DC Orders ED Discharge Orders          Ordered    naproxen (NAPROSYN) 500 MG tablet  2 times daily        01/11/22 1831    methocarbamol (ROBAXIN) 500 MG tablet  Nightly        01/11/22 1831    lidocaine 4 %  Every 24 hours        01/11/22 1831              Candace Cruise 50/35/46 2255     Fredia Sorrow, MD 01/24/22 1758

## 2022-06-03 DIAGNOSIS — Z419 Encounter for procedure for purposes other than remedying health state, unspecified: Secondary | ICD-10-CM | POA: Diagnosis not present

## 2022-06-05 ENCOUNTER — Other Ambulatory Visit: Payer: Self-pay | Admitting: Internal Medicine

## 2022-06-05 DIAGNOSIS — Z1231 Encounter for screening mammogram for malignant neoplasm of breast: Secondary | ICD-10-CM

## 2022-06-07 ENCOUNTER — Other Ambulatory Visit: Payer: Self-pay | Admitting: Internal Medicine

## 2022-06-08 LAB — COMPLETE METABOLIC PANEL WITH GFR
AG Ratio: 1.1 (calc) (ref 1.0–2.5)
ALT: 17 U/L (ref 6–29)
AST: 13 U/L (ref 10–35)
Albumin: 3.8 g/dL (ref 3.6–5.1)
Alkaline phosphatase (APISO): 144 U/L (ref 37–153)
BUN: 15 mg/dL (ref 7–25)
CO2: 23 mmol/L (ref 20–32)
Calcium: 9.4 mg/dL (ref 8.6–10.4)
Chloride: 99 mmol/L (ref 98–110)
Creat: 0.93 mg/dL (ref 0.50–1.03)
Globulin: 3.6 g/dL (calc) (ref 1.9–3.7)
Glucose, Bld: 137 mg/dL — ABNORMAL HIGH (ref 65–99)
Potassium: 4.7 mmol/L (ref 3.5–5.3)
Sodium: 137 mmol/L (ref 135–146)
Total Bilirubin: 0.3 mg/dL (ref 0.2–1.2)
Total Protein: 7.4 g/dL (ref 6.1–8.1)
eGFR: 72 mL/min/{1.73_m2} (ref >=60)

## 2022-06-08 LAB — URINE CULTURE
MICRO NUMBER:: 14394371
Result:: NO GROWTH
SPECIMEN QUALITY:: ADEQUATE

## 2022-06-08 LAB — LIPID PANEL
Cholesterol: 200 mg/dL — ABNORMAL HIGH (ref <200)
HDL: 31 mg/dL — ABNORMAL LOW (ref >=50)
LDL Cholesterol (Calc): 132 mg/dL (calc) — ABNORMAL HIGH
Non-HDL Cholesterol (Calc): 169 mg/dL (calc) — ABNORMAL HIGH (ref <130)
Total CHOL/HDL Ratio: 6.5 (calc) — ABNORMAL HIGH (ref <5.0)
Triglycerides: 220 mg/dL — ABNORMAL HIGH (ref <150)

## 2022-06-08 LAB — CBC
HCT: 39 % (ref 35.0–45.0)
Hemoglobin: 13.2 g/dL (ref 11.7–15.5)
MCH: 30.1 pg (ref 27.0–33.0)
MCHC: 33.8 g/dL (ref 32.0–36.0)
MCV: 88.8 fL (ref 80.0–100.0)
MPV: 10.6 fL (ref 7.5–12.5)
Platelets: 359 10*3/uL (ref 140–400)
RBC: 4.39 10*6/uL (ref 3.80–5.10)
RDW: 13.2 % (ref 11.0–15.0)
WBC: 8.6 10*3/uL (ref 3.8–10.8)

## 2022-06-08 LAB — TSH: TSH: 1.92 mIU/L (ref 0.40–4.50)

## 2022-06-08 LAB — VITAMIN D 25 HYDROXY (VIT D DEFICIENCY, FRACTURES): Vit D, 25-Hydroxy: 25 ng/mL — ABNORMAL LOW (ref 30–100)

## 2022-06-10 ENCOUNTER — Other Ambulatory Visit: Payer: Self-pay | Admitting: Internal Medicine

## 2022-06-10 DIAGNOSIS — E2839 Other primary ovarian failure: Secondary | ICD-10-CM

## 2022-06-19 ENCOUNTER — Ambulatory Visit: Payer: Medicare PPO

## 2022-06-24 ENCOUNTER — Ambulatory Visit
Admission: RE | Admit: 2022-06-24 | Discharge: 2022-06-24 | Disposition: A | Discharge status: Home / Self Care | Payer: Medicare PPO | Source: Ambulatory Visit | Attending: Internal Medicine | Admitting: Internal Medicine

## 2022-06-24 DIAGNOSIS — Z1231 Encounter for screening mammogram for malignant neoplasm of breast: Secondary | ICD-10-CM

## 2022-07-04 DIAGNOSIS — Z419 Encounter for procedure for purposes other than remedying health state, unspecified: Secondary | ICD-10-CM | POA: Diagnosis not present

## 2022-08-02 DIAGNOSIS — Z419 Encounter for procedure for purposes other than remedying health state, unspecified: Secondary | ICD-10-CM | POA: Diagnosis not present

## 2022-09-02 DIAGNOSIS — Z419 Encounter for procedure for purposes other than remedying health state, unspecified: Secondary | ICD-10-CM | POA: Diagnosis not present

## 2022-10-02 DIAGNOSIS — Z419 Encounter for procedure for purposes other than remedying health state, unspecified: Secondary | ICD-10-CM | POA: Diagnosis not present

## 2022-11-02 DIAGNOSIS — Z419 Encounter for procedure for purposes other than remedying health state, unspecified: Secondary | ICD-10-CM | POA: Diagnosis not present

## 2022-11-26 ENCOUNTER — Ambulatory Visit
Admission: RE | Admit: 2022-11-26 | Discharge: 2022-11-26 | Disposition: A | Discharge status: Home / Self Care | Payer: Medicare HMO | Source: Ambulatory Visit | Attending: Internal Medicine | Admitting: Internal Medicine

## 2022-11-26 DIAGNOSIS — E2839 Other primary ovarian failure: Secondary | ICD-10-CM

## 2022-12-02 DIAGNOSIS — Z419 Encounter for procedure for purposes other than remedying health state, unspecified: Secondary | ICD-10-CM | POA: Diagnosis not present

## 2023-01-02 DIAGNOSIS — Z419 Encounter for procedure for purposes other than remedying health state, unspecified: Secondary | ICD-10-CM | POA: Diagnosis not present

## 2023-02-02 DIAGNOSIS — Z419 Encounter for procedure for purposes other than remedying health state, unspecified: Secondary | ICD-10-CM | POA: Diagnosis not present

## 2023-04-04 DIAGNOSIS — Z419 Encounter for procedure for purposes other than remedying health state, unspecified: Secondary | ICD-10-CM | POA: Diagnosis not present

## 2023-05-04 DIAGNOSIS — Z419 Encounter for procedure for purposes other than remedying health state, unspecified: Secondary | ICD-10-CM | POA: Diagnosis not present

## 2023-06-04 DIAGNOSIS — Z419 Encounter for procedure for purposes other than remedying health state, unspecified: Secondary | ICD-10-CM | POA: Diagnosis not present

## 2023-07-05 DIAGNOSIS — Z419 Encounter for procedure for purposes other than remedying health state, unspecified: Secondary | ICD-10-CM | POA: Diagnosis not present

## 2023-07-17 ENCOUNTER — Other Ambulatory Visit: Payer: Self-pay | Admitting: Internal Medicine

## 2023-07-17 DIAGNOSIS — Z1231 Encounter for screening mammogram for malignant neoplasm of breast: Secondary | ICD-10-CM

## 2023-07-24 ENCOUNTER — Ambulatory Visit: Payer: Medicare HMO

## 2023-07-28 ENCOUNTER — Ambulatory Visit
Admission: RE | Admit: 2023-07-28 | Discharge: 2023-07-28 | Disposition: A | Discharge status: Home / Self Care | Payer: Medicare HMO | Source: Ambulatory Visit | Attending: Internal Medicine | Admitting: Internal Medicine

## 2023-07-28 DIAGNOSIS — Z1231 Encounter for screening mammogram for malignant neoplasm of breast: Secondary | ICD-10-CM

## 2023-08-02 DIAGNOSIS — Z419 Encounter for procedure for purposes other than remedying health state, unspecified: Secondary | ICD-10-CM | POA: Diagnosis not present

## 2023-09-13 DIAGNOSIS — Z419 Encounter for procedure for purposes other than remedying health state, unspecified: Secondary | ICD-10-CM | POA: Diagnosis not present

## 2023-10-13 DIAGNOSIS — Z419 Encounter for procedure for purposes other than remedying health state, unspecified: Secondary | ICD-10-CM | POA: Diagnosis not present

## 2024-04-21 ENCOUNTER — Other Ambulatory Visit: Payer: Self-pay

## 2024-04-21 ENCOUNTER — Encounter (HOSPITAL_BASED_OUTPATIENT_CLINIC_OR_DEPARTMENT_OTHER): Payer: Self-pay | Admitting: Orthopaedic Surgery

## 2024-04-22 ENCOUNTER — Encounter (HOSPITAL_BASED_OUTPATIENT_CLINIC_OR_DEPARTMENT_OTHER)
Admission: RE | Admit: 2024-04-22 | Discharge: 2024-04-22 | Disposition: A | Discharge status: Home / Self Care | Source: Ambulatory Visit | Attending: Orthopaedic Surgery | Admitting: Orthopaedic Surgery

## 2024-04-22 DIAGNOSIS — Z01818 Encounter for other preprocedural examination: Secondary | ICD-10-CM | POA: Diagnosis present

## 2024-04-22 DIAGNOSIS — Z01810 Encounter for preprocedural cardiovascular examination: Secondary | ICD-10-CM | POA: Insufficient documentation

## 2024-04-24 NOTE — H&P (Signed)
 ORTHOPAEDIC SURGERY H&P  Subjective:  The patient presents for right 1st MTP fusion, possible allograft.   Past Medical History:  Diagnosis Date   Anxiety    Arthritis    knees   Bipolar 1 disorder (HCC)    Breast cancer (HCC)    breast cancer right    Convulsions/seizures (HCC) 03/16/2015   08-27-2014 last seizure   Depression 05/06/2012   Depression, major    History of stroke 09/14/2014   Hyperlipidemia 05/06/2012   Hypertension    Neuromuscular disorder (HCC)    neuropathy, plantar fascitis    Obesity    Personal history of radiation therapy    completed 08-18-2015   Schizo affective schizophrenia (HCC)    resolved per pt per her Psychiatrist    Stroke [facility])    05/02/12, no deficits   Tobacco abuse 05/06/2012    Past Surgical History:  Procedure Laterality Date   ABDOMINAL HYSTERECTOMY     BREAST BIOPSY Left 2007   BREAST LUMPECTOMY Right 2016   CHOLECYSTECTOMY     COLONOSCOPY     POLYPECTOMY     RADIOACTIVE SEED GUIDED PARTIAL MASTECTOMY WITH AXILLARY SENTINEL LYMPH NODE BIOPSY Right 05/12/2015   no mastectomy   TEE WITHOUT CARDIOVERSION  05/06/2012   Procedure: TRANSESOPHAGEAL ECHOCARDIOGRAM (TEE);  Surgeon: Ezra GORMAN Shuck, MD;  Location: [facility] ENDOSCOPY;  Service: Cardiovascular;  Laterality: N/A;     (Not in an outpatient encounter)    Allergies  Allergen Reactions   Morphine And Codeine Hives and Itching   Hydrocodone Rash    Social History   Socioeconomic History   Marital status: Widowed    Spouse name: Not on file   Number of children: 3   Years of education: 57   Highest education level: Not on file  Occupational History   Occupation: disabled  Tobacco Use   Smoking status: Every Day    Current packs/day: 0.50    Types: Cigarettes   Smokeless tobacco: Never  Substance and Sexual Activity   Alcohol use: No   Drug use: Yes    Frequency: 3.0 times per week   Sexual activity: Not Currently  Other Topics Concern   Not on file  Social  History Narrative   Patient  lives at home with her brother(Kevin). Patient is disabled and has three children. Patient is sep rated  And high school education.   Patient is right handed.   Patient drinks 3-4 cups of caffeine  daily.   Social Drivers of Corporate Investment Banker Strain: Not on file  Food Insecurity: Not on file  Transportation Needs: Not on file  Physical Activity: Not on file  Stress: Not on file  Social Connections: Not on file  Intimate Partner Violence: Not on file     History reviewed. No pertinent family history.   Review of Systems Pertinent items are noted in HPI.  Objective: Vital signs in last 24 hours:    04/21/2024    9:47 AM 01/11/2022    6:57 PM 01/11/2022    3:46 PM  Vitals with BMI  Height 5' 3    Weight 170 lbs    BMI 30.12    Systolic  140 140  Diastolic  93 88  Pulse  86 89      EXAM: General: Well nourished, well developed. Awake, alert and oriented to time, place, person. Normal mood and affect. No apparent distress. Breathing room air.  Operative Lower Extremity: Alignment - Neutral Deformity - None Skin intact  Tenderness to palpation - right 1st MTP 5/5 TA, PT, GS, Per, EHL, FHL Sensation intact to light touch throughout Palpable DP and PT pulses Special testing: None  The contralateral foot/ankle was examined for comparison and noted to be neurovascularly intact with no localized deformity, swelling, or tenderness.  Imaging Review All images taken were independently reviewed by me.  Assessment/Plan: The clinical and radiographic findings were reviewed and discussed at length with the patient.  The patient presents for right 1st MTP fusion, possible allograft.  We spoke at length about the natural course of these findings. We discussed nonoperative and operative treatment options in detail.  The risks and benefits were presented and reviewed. The risks due to hardware/suture failure and/or irritation (if removing  hardware: inability to remove part/all of hardware, recurrent instability), new/persistent infection, stiffness, nerve/vessel/tendon injury or rerupture of repaired tendon, nonunion/malunion, allograft usage, wound healing issues, development of arthritis, failure of this surgery, possibility of external fixation with delayed definitive surgery, need for further surgery, thromboembolic events, anesthesia/medical complications, amputation, death among others were discussed.  Lillia Mountain  Orthopaedic Surgery [facility]

## 2024-04-24 NOTE — Discharge Instructions (Signed)
 April Mountain, MD [facility]  Please read the following information regarding your care after surgery.  Medications   - Oxycodone  5 mg every 6 hours as needed for pain - Aspirin  81 mg twice daily as scheduled to prevent blood clots - Colace 100 mg twice daily as needed for constipation - Zofran  4 mg every 8 hours as needed for nausea/vomiting  We send above prescriptions to your pharmacy on file.  In addition you may also use: ? acetaminophen  (Tylenol ) 500 mg every 4-6 hours as you need for minor to moderate pain  Resume all other routine medications per usual or as directed by your PCP/other specialists.  Weight Bearing ? Do NOT bear any weight on the operated leg or foot. This means do NOT touch your surgical leg to the ground!  Cast / Splint / Dressing ? If you have a splint, do NOT remove this. Keep your splint, cast or dressing clean and dry.  Don't put anything (coat hanger, pencil, etc) down inside of it.  If it gets wet, call the office immediately to schedule an appointment for a cast change.  Swelling IMPORTANT: It is normal for you to have swelling where you had surgery. To reduce swelling and pain, keep at least 3 pillows under your leg so that your toes are above your nose and your heel is above the level of your hip.  It may be necessary to keep your foot or leg elevated for several weeks.  This is critical to helping your incisions heal and your pain to feel better.  Follow Up Call my office at [phone] when you are discharged from the hospital or surgery center to schedule an appointment to be seen 7-10 days after surgery.  Call my office at [phone] if you develop a fever >101.5 F, nausea, vomiting, bleeding from the surgical site or severe pain.    *You had 1000mg  of Tylenol  at 1030. Next dose would need to be after 4:30pm today, if needed.   Post Anesthesia Home Care Instructions  Activity: Get plenty of rest for the remainder of the day. A  responsible individual must stay with you for 24 hours following the procedure.  For the next 24 hours, DO NOT: -Drive a car -Advertising copywriter -Drink alcoholic beverages -Take any medication unless instructed by your physician -Make any legal decisions or sign important papers.  Meals: Start with liquid foods such as gelatin or soup. Progress to regular foods as tolerated. Avoid greasy, spicy, heavy foods. If nausea and/or vomiting occur, drink only clear liquids until the nausea and/or vomiting subsides. Call your physician if vomiting continues.  Special Instructions/Symptoms: Your throat may feel dry or sore from the anesthesia or the breathing tube placed in your throat during surgery. If this causes discomfort, gargle with warm salt water. The discomfort should disappear within 24 hours.  If you had a scopolamine  patch placed behind your ear for the management of post- operative nausea and/or vomiting:  1. The medication in the patch is effective for 72 hours, after which it should be removed.  Wrap patch in a tissue and discard in the trash. Wash hands thoroughly with soap and water. 2. You may remove the patch earlier than 72 hours if you experience unpleasant side effects which may include dry mouth, dizziness or visual disturbances. 3. Avoid touching the patch. Wash your hands with soap and water after contact with the patch.   Regional Anesthesia Blocks  1. You may not be able to move or  feel the blocked extremity after a regional anesthetic block. This may last may last from 3-48 hours after placement, but it will go away. The length of time depends on the medication injected and your individual response to the medication. As the nerves start to wake up, you may experience tingling as the movement and feeling returns to your extremity. If the numbness and inability to move your extremity has not gone away after 48 hours, please call your surgeon.   2. The extremity that is  blocked will need to be protected until the numbness is gone and the strength has returned. Because you cannot feel it, you will need to take extra care to avoid injury. Because it may be weak, you may have difficulty moving it or using it. You may not know what position it is in without looking at it while the block is in effect.  3. For blocks in the legs and feet, returning to weight bearing and walking needs to be done carefully. You will need to wait until the numbness is entirely gone and the strength has returned. You should be able to move your leg and foot normally before you try and bear weight or walk. You will need someone to be with you when you first try to ensure you do not fall and possibly risk injury.  4. Bruising and tenderness at the needle site are common side effects and will resolve in a few days.  5. Persistent numbness or new problems with movement should be communicated to the surgeon or the [facility] [phone] [facility] [phone]).

## 2024-04-28 ENCOUNTER — Ambulatory Visit (HOSPITAL_BASED_OUTPATIENT_CLINIC_OR_DEPARTMENT_OTHER)

## 2024-04-28 ENCOUNTER — Encounter (HOSPITAL_BASED_OUTPATIENT_CLINIC_OR_DEPARTMENT_OTHER)
Admission: RE | Disposition: A | Discharge status: Home / Self Care | Payer: Self-pay | Source: Home / Self Care | Attending: Orthopaedic Surgery

## 2024-04-28 ENCOUNTER — Encounter (HOSPITAL_BASED_OUTPATIENT_CLINIC_OR_DEPARTMENT_OTHER): Payer: Self-pay | Admitting: Orthopaedic Surgery

## 2024-04-28 ENCOUNTER — Ambulatory Visit (HOSPITAL_BASED_OUTPATIENT_CLINIC_OR_DEPARTMENT_OTHER)
Admission: RE | Admit: 2024-04-28 | Discharge: 2024-04-28 | Disposition: A | Discharge status: Home / Self Care | Attending: Orthopaedic Surgery | Admitting: Orthopaedic Surgery

## 2024-04-28 DIAGNOSIS — F17210 Nicotine dependence, cigarettes, uncomplicated: Secondary | ICD-10-CM | POA: Insufficient documentation

## 2024-04-28 DIAGNOSIS — F419 Anxiety disorder, unspecified: Secondary | ICD-10-CM | POA: Insufficient documentation

## 2024-04-28 DIAGNOSIS — I10 Essential (primary) hypertension: Secondary | ICD-10-CM | POA: Insufficient documentation

## 2024-04-28 DIAGNOSIS — M2021 Hallux rigidus, right foot: Secondary | ICD-10-CM | POA: Insufficient documentation

## 2024-04-28 DIAGNOSIS — M2011 Hallux valgus (acquired), right foot: Secondary | ICD-10-CM | POA: Diagnosis present

## 2024-04-28 DIAGNOSIS — Z01818 Encounter for other preprocedural examination: Secondary | ICD-10-CM

## 2024-04-28 DIAGNOSIS — F319 Bipolar disorder, unspecified: Secondary | ICD-10-CM | POA: Diagnosis not present

## 2024-04-28 DIAGNOSIS — F259 Schizoaffective disorder, unspecified: Secondary | ICD-10-CM | POA: Diagnosis not present

## 2024-04-28 DIAGNOSIS — Z79899 Other long term (current) drug therapy: Secondary | ICD-10-CM | POA: Diagnosis not present

## 2024-04-28 HISTORY — PX: FOOT ARTHRODESIS: SHX1655

## 2024-04-28 SURGERY — FUSION, JOINT, FOOT
Anesthesia: General | Site: Foot | Laterality: Right

## 2024-04-28 MED ORDER — FENTANYL CITRATE (PF) 100 MCG/2ML IJ SOLN
100.0000 ug | Freq: Once | INTRAMUSCULAR | Status: AC
  Administered 2024-04-28: 100 ug via INTRAVENOUS

## 2024-04-28 MED ORDER — ONDANSETRON HCL 4 MG/2ML IJ SOLN: 4.0000 mg | Freq: Once | INTRAMUSCULAR | Status: DC | PRN

## 2024-04-28 MED ORDER — FENTANYL CITRATE (PF) 100 MCG/2ML IJ SOLN
INTRAMUSCULAR | Status: AC
  Filled 2024-04-28: qty 2

## 2024-04-28 MED ORDER — OXYCODONE HCL 5 MG/5ML PO SOLN: 5.0000 mg | Freq: Once | ORAL | Status: AC | PRN

## 2024-04-28 MED ORDER — MIDAZOLAM HCL 2 MG/2ML IJ SOLN
INTRAMUSCULAR | Status: AC
  Filled 2024-04-28: qty 2

## 2024-04-28 MED ORDER — CEFAZOLIN SODIUM-DEXTROSE 2-4 GM/100ML-% IV SOLN
INTRAVENOUS | Status: AC
  Filled 2024-04-28: qty 100

## 2024-04-28 MED ORDER — VANCOMYCIN HCL 500 MG IV SOLR
INTRAVENOUS | Status: DC | PRN
  Administered 2024-04-28: 500 mg via TOPICAL

## 2024-04-28 MED ORDER — EPHEDRINE SULFATE (PRESSORS) 25 MG/5ML IV SOSY
PREFILLED_SYRINGE | INTRAVENOUS | Status: DC | PRN
  Administered 2024-04-28: 10 mg via INTRAVENOUS

## 2024-04-28 MED ORDER — FENTANYL CITRATE (PF) 100 MCG/2ML IJ SOLN
25.0000 ug | INTRAMUSCULAR | Status: DC | PRN
  Administered 2024-04-28 (×2): 50 ug via INTRAVENOUS

## 2024-04-28 MED ORDER — ACETAMINOPHEN 10 MG/ML IV SOLN
1000.0000 mg | Freq: Once | INTRAVENOUS | Status: DC | PRN
  Administered 2024-04-28: 1000 mg via INTRAVENOUS

## 2024-04-28 MED ORDER — LIDOCAINE 2% (20 MG/ML) 5 ML SYRINGE
INTRAMUSCULAR | Status: AC
  Filled 2024-04-28: qty 5

## 2024-04-28 MED ORDER — POVIDONE-IODINE 10 % EX SOLN
CUTANEOUS | Status: DC | PRN
  Administered 2024-04-28: 1 via TOPICAL

## 2024-04-28 MED ORDER — DOCUSATE SODIUM 100 MG PO CAPS: 100.0000 mg | ORAL_CAPSULE | Freq: Two times a day (BID) | ORAL | 0 refills | Status: AC

## 2024-04-28 MED ORDER — PHENYLEPHRINE HCL (PRESSORS) 10 MG/ML IV SOLN
INTRAVENOUS | Status: DC | PRN
  Administered 2024-04-28 (×3): 80 ug via INTRAVENOUS

## 2024-04-28 MED ORDER — AMISULPRIDE (ANTIEMETIC) 5 MG/2ML IV SOLN
10.0000 mg | Freq: Once | INTRAVENOUS | Status: DC | PRN
Start: 2024-04-28 — End: 2024-04-28

## 2024-04-28 MED ORDER — MIDAZOLAM HCL (PF) 2 MG/2ML IJ SOLN
2.0000 mg | Freq: Once | INTRAMUSCULAR | Status: AC
  Administered 2024-04-28: 2 mg via INTRAVENOUS

## 2024-04-28 MED ORDER — OXYCODONE HCL 5 MG PO TABS
5.0000 mg | ORAL_TABLET | Freq: Once | ORAL | Status: AC | PRN
  Administered 2024-04-28: 5 mg via ORAL

## 2024-04-28 MED ORDER — PROPOFOL 10 MG/ML IV BOLUS
INTRAVENOUS | Status: DC | PRN
  Administered 2024-04-28 (×2): 50 mg via INTRAVENOUS
  Administered 2024-04-28: 100 mg via INTRAVENOUS

## 2024-04-28 MED ORDER — LACTATED RINGERS IV SOLN: INTRAVENOUS | Status: DC

## 2024-04-28 MED ORDER — OXYCODONE HCL 5 MG PO TABS
ORAL_TABLET | ORAL | Status: AC
  Filled 2024-04-28: qty 1

## 2024-04-28 MED ORDER — BUPIVACAINE-EPINEPHRINE (PF) 0.5% -1:200000 IJ SOLN
INTRAMUSCULAR | Status: DC | PRN
  Administered 2024-04-28: 20 mL

## 2024-04-28 MED ORDER — PROPOFOL 500 MG/50ML IV EMUL
INTRAVENOUS | Status: AC
  Filled 2024-04-28: qty 50

## 2024-04-28 MED ORDER — CHLORHEXIDINE GLUCONATE 4 % EX SOLN: 60.0000 mL | Freq: Once | CUTANEOUS | Status: DC

## 2024-04-28 MED ORDER — LIDOCAINE HCL (CARDIAC) PF 100 MG/5ML IV SOSY
PREFILLED_SYRINGE | INTRAVENOUS | Status: DC | PRN
  Administered 2024-04-28: 100 mg via INTRAVENOUS

## 2024-04-28 MED ORDER — OXYCODONE HCL 5 MG PO TABS: 5.0000 mg | ORAL_TABLET | ORAL | 0 refills | Status: AC | PRN

## 2024-04-28 MED ORDER — ONDANSETRON 4 MG PO TBDP: 4.0000 mg | ORAL_TABLET | Freq: Three times a day (TID) | ORAL | 0 refills | Status: AC | PRN

## 2024-04-28 MED ORDER — ACETAMINOPHEN 10 MG/ML IV SOLN
INTRAVENOUS | Status: AC
  Filled 2024-04-28: qty 100

## 2024-04-28 MED ORDER — DEXAMETHASONE SOD PHOSPHATE PF 10 MG/ML IJ SOLN
INTRAMUSCULAR | Status: DC | PRN
  Administered 2024-04-28: 10 mg via INTRAVENOUS

## 2024-04-28 MED ORDER — ONDANSETRON HCL 4 MG/2ML IJ SOLN
INTRAMUSCULAR | Status: DC | PRN
  Administered 2024-04-28: 4 mg via INTRAVENOUS

## 2024-04-28 MED ORDER — 0.9 % SODIUM CHLORIDE (POUR BTL) OPTIME
TOPICAL | Status: DC | PRN
Start: 2024-04-28 — End: 2024-04-28
  Administered 2024-04-28: 1000 mL

## 2024-04-28 MED ORDER — CEFAZOLIN SODIUM-DEXTROSE 2-4 GM/100ML-% IV SOLN
2.0000 g | INTRAVENOUS | Status: AC
  Administered 2024-04-28: 2 g via INTRAVENOUS

## 2024-04-28 MED ORDER — ONDANSETRON HCL 4 MG/2ML IJ SOLN
INTRAMUSCULAR | Status: AC
  Filled 2024-04-28: qty 2

## 2024-04-28 MED ORDER — ASPIRIN 81 MG PO TBEC: 81.0000 mg | DELAYED_RELEASE_TABLET | Freq: Two times a day (BID) | ORAL | 0 refills | Status: AC

## 2024-04-28 SURGICAL SUPPLY — 55 items
BIT DRILL 60X2.5XDISP (BIT) IMPLANT
BIT DRILL CAN 2.7 (BIT) IMPLANT
BLADE AVERAGE 25X9 (BLADE) IMPLANT
BLADE PRESCISION 7.0X.51X18.5 (BLADE) IMPLANT
BLADE SURG 15 STRL LF DISP TIS (BLADE) ×2 IMPLANT
BNDG COHESIVE 4X5 TAN STRL LF (GAUZE/BANDAGES/DRESSINGS) ×1 IMPLANT
BNDG COMPR ESMARK 6X3 LF (GAUZE/BANDAGES/DRESSINGS) IMPLANT
BNDG ELASTIC 4INX 5YD STR LF (GAUZE/BANDAGES/DRESSINGS) ×1 IMPLANT
BNDG STRETCH GAUZE 3IN X12FT (GAUZE/BANDAGES/DRESSINGS) ×1 IMPLANT
CANISTER SUCT 1200ML W/VALVE (MISCELLANEOUS) ×1 IMPLANT
CHLORAPREP W/TINT 26 (MISCELLANEOUS) ×1 IMPLANT
COVER BACK TABLE 60X90IN (DRAPES) ×1 IMPLANT
CUFF TRNQT CYL 34X4.125X (TOURNIQUET CUFF) ×1 IMPLANT
DRAPE C-ARM 42X72 X-RAY (DRAPES) ×1 IMPLANT
DRAPE C-ARMOR (DRAPES) ×1 IMPLANT
DRAPE EXTREMITY T 121X128X90 (DISPOSABLE) ×1 IMPLANT
DRAPE IMP U-DRAPE 54X76 (DRAPES) ×1 IMPLANT
DRAPE U-SHAPE 47X51 STRL (DRAPES) ×1 IMPLANT
DRSG MEPITEL 4X7.2 (GAUZE/BANDAGES/DRESSINGS) ×1 IMPLANT
ELECTRODE REM PT RTRN 9FT ADLT (ELECTROSURGICAL) ×1 IMPLANT
GAUZE PAD ABD 8X10 STRL (GAUZE/BANDAGES/DRESSINGS) ×3 IMPLANT
GAUZE SPONGE 4X4 12PLY STRL (GAUZE/BANDAGES/DRESSINGS) ×1 IMPLANT
GLOVE BIOGEL PI IND STRL 8 (GLOVE) ×1 IMPLANT
GLOVE SURG SS PI 7.5 STRL IVOR (GLOVE) ×1 IMPLANT
GOWN STRL REUS W/ TWL LRG LVL3 (GOWN DISPOSABLE) ×2 IMPLANT
GUIDEWIRE UNTHRD 1.4X150 (WIRE) IMPLANT
NDL HYPO 22X1.5 SAFETY MO (MISCELLANEOUS) IMPLANT
PACK BASIN DAY SURGERY FS (CUSTOM PROCEDURE TRAY) ×1 IMPLANT
PADDING CAST ABS COTTON 4X4 ST (CAST SUPPLIES) IMPLANT
PADDING CAST SYNTHETIC 4X4 STR (CAST SUPPLIES) IMPLANT
PADDING CAST SYNTHETIC 6X4 NS (CAST SUPPLIES) IMPLANT
PENCIL SMOKE EVACUATOR (MISCELLANEOUS) ×1 IMPLANT
PIN TEMP (PIN) IMPLANT
PLATE ORTHO 3DI 5H LOCKING (Plate) IMPLANT
SCREW 3.5X 16 NON LOCK (Screw) IMPLANT
SCREW COMP 4X30 CANN (Screw) IMPLANT
SCREW LOCK FT 3.5X14 (Screw) IMPLANT
SCREW ORTH 3DI 3.5X12 CORT (Screw) IMPLANT
SCREW ORTHOLOC 3DI 3.5X10 (Screw) IMPLANT
SHEET MEDIUM DRAPE 40X70 STRL (DRAPES) ×1 IMPLANT
SLEEVE SCD COMPRESS KNEE MED (STOCKING) ×1 IMPLANT
SOLN 0.9% NACL POUR BTL 1000ML (IV SOLUTION) ×1 IMPLANT
SPIKE FLUID TRANSFER (MISCELLANEOUS) IMPLANT
SPLINT FIBERGLASS 4X30 (CAST SUPPLIES) ×2 IMPLANT
SPONGE T-LAP 18X18 ~~LOC~~+RFID (SPONGE) ×1 IMPLANT
SUCTION TUBE FRAZIER 10FR DISP (SUCTIONS) IMPLANT
SUT ETHILON 2 0 FS 18 (SUTURE) ×1 IMPLANT
SUT MNCRL AB 3-0 PS2 18 (SUTURE) IMPLANT
SUT VIC AB 0 CT1 27XBRD ANBCTR (SUTURE) ×1 IMPLANT
SUT VIC AB 2-0 CT1 TAPERPNT 27 (SUTURE) ×1 IMPLANT
SUTURE FIBERWR #2 38 T-5 BLUE (SUTURE) IMPLANT
SYR BULB EAR ULCER 3OZ GRN STR (SYRINGE) ×1 IMPLANT
SYR CONTROL 10ML LL (SYRINGE) IMPLANT
TOWEL GREEN STERILE FF (TOWEL DISPOSABLE) ×2 IMPLANT
UNDERPAD 30X36 HEAVY ABSORB (UNDERPADS AND DIAPERS) ×1 IMPLANT

## 2024-04-28 NOTE — H&P (Addendum)
 H&P Update:  -History and Physical Reviewed  -Patient has been re-examined  -No change in the plan of care. In light of her CVA and development of hallux rigidus as well as advanced bunion, we will proceed as planned with fusion of great toe MTP with heel WB postop in forefoot offloading shoe  -The risks and benefits were presented and reviewed. The risks due to hardware/suture failure and/or irritation (if removing hardware: inability to remove part/all of hardware, recurrent instability), new/persistent infection, stiffness, nerve/vessel/tendon injury or rerupture of repaired tendon, nonunion/malunion, allograft usage, wound healing issues, development of arthritis, failure of this surgery, possibility of external fixation with delayed definitive surgery, need for further surgery, thromboembolic events, anesthesia/medical complications, amputation, death among others were discussed. The patient acknowledged the explanation, agreed to proceed with the plan and a consent was signed.  April [name]

## 2024-04-28 NOTE — Anesthesia Procedure Notes (Signed)
 Anesthesia Regional Block: Popliteal block   Pre-Anesthetic Checklist: , timeout performed,  Correct Patient, Correct Site, Correct Laterality,  Correct Procedure, Correct Position, site marked,  Risks and benefits discussed,  Surgical consent,  Pre-op evaluation,  At surgeon's request and post-op pain management  Laterality: Lower and Right  Prep: chloraprep       Needles:  Injection technique: Single-shot  Needle Type: Echogenic Stimulator Needle     Needle Length: 9cm  Needle Gauge: 20     Additional Needles:   Procedures:,,,, ultrasound used (permanent image in chart),, #20gu IV placed    Narrative:  Start time: 04/28/2024 7:59 AM End time: 04/28/2024 7:59 AM Injection made incrementally with aspirations every 5 mL.  Performed by: Personally  Anesthesiologist: Waddell Lauraine NOVAK, MD

## 2024-04-28 NOTE — Anesthesia Preprocedure Evaluation (Addendum)
 Anesthesia Evaluation  Patient identified by MRN, date of birth, ID band Patient awake    Reviewed: Allergy & Precautions, NPO status , Patient's Chart, lab work & pertinent test results, reviewed documented beta blocker date and time   History of Anesthesia Complications Negative for: history of anesthetic complications  Airway Mallampati: III  TM Distance: >3 FB Neck ROM: Full    Dental  (+) Edentulous Upper, Edentulous Lower   Pulmonary Current Smoker   breath sounds clear to auscultation       Cardiovascular hypertension, Pt. on medications and Pt. on home beta blockers  Rhythm:Regular Rate:Normal     Neuro/Psych  Headaches, Seizures -, Well Controlled,  PSYCHIATRIC DISORDERS Anxiety Depression Bipolar Disorder Schizophrenia     GI/Hepatic   Endo/Other    Renal/GU      Musculoskeletal  (+) Arthritis ,    Abdominal   Peds  Hematology   Anesthesia Other Findings   Reproductive/Obstetrics Hx of Breast Cancer s/p Lumpectomy and XRT                              Anesthesia Physical Anesthesia Plan  ASA: 2  Anesthesia Plan: General   Post-op Pain Management:    Induction: Intravenous  PONV Risk Score and Plan: 2 and Ondansetron , Dexamethasone  and Treatment may vary due to age or medical condition  Airway Management Planned: LMA and Oral ETT  Additional Equipment: None  Intra-op Plan:   Post-operative Plan: Extubation in OR  Informed Consent:      Dental advisory given  Plan Discussed with: CRNA  Anesthesia Plan Comments:         Anesthesia Quick Evaluation

## 2024-04-28 NOTE — Transfer of Care (Signed)
 Immediate Anesthesia Transfer of Care Note  Patient: Dan T Railsback  Procedure(s) Performed: FUSION, JOINT, FOOT BIG TOE (Right: Foot)  Patient Location: PACU  Anesthesia Type:GA combined with regional for post-op pain  Level of Consciousness: awake, alert , and oriented  Airway & Oxygen Therapy: Patient Spontanous Breathing and Patient connected to face mask oxygen  Post-op Assessment: Report given to RN and Post -op Vital signs reviewed and stable  Post vital signs: Reviewed and stable  Last Vitals:  Vitals Value Taken Time  BP 112/64 04/28/24 10:08  Temp 36.3 C 04/28/24 10:08  Pulse 82 04/28/24 10:11  Resp 19 04/28/24 10:11  SpO2 97 % 04/28/24 10:11  Vitals shown include unfiled device data.  Last Pain:  Vitals:   04/28/24 0706  TempSrc: Temporal  PainSc: 0-No pain      Patients Stated Pain Goal: 3 (04/28/24 0706)  Complications: No notable events documented.

## 2024-04-28 NOTE — Anesthesia Postprocedure Evaluation (Signed)
 Anesthesia Post Note  Patient: April [name]  Procedure(s) Performed: FUSION, JOINT, FOOT BIG TOE (Right: Foot)     Patient location during evaluation: Phase II Anesthesia Type: General Level of consciousness: awake Pain management: pain level controlled Vital Signs Assessment: post-procedure vital signs reviewed and stable Respiratory status: spontaneous breathing Cardiovascular status: blood pressure returned to baseline Postop Assessment: no apparent nausea or vomiting Anesthetic complications: no   No notable events documented.  Last Vitals:  Vitals:   04/28/24 1015 04/28/24 1030  BP: 139/62 (!) 120/59  Pulse: 79 75  Resp: 14 15  Temp:    SpO2: 97% 97%    Last Pain:  Vitals:   04/28/24 1039  TempSrc:   PainSc: 3                  Lauraine KATHEE [place]

## 2024-04-28 NOTE — Progress Notes (Signed)
 Assisted Dr Lauraine Plowman with right, popliteal, ultrasound guided block. Side rails up, monitors on throughout procedure. See vital signs in flow sheet. Tolerated Procedure well.

## 2024-04-28 NOTE — Op Note (Signed)
 04/28/2024  11:19 PM   PATIENT: April [name]  58 y.o. female  MRN: [id number]   PRE-OPERATIVE DIAGNOSIS:   Hallux rigidus, right foot   POST-OPERATIVE DIAGNOSIS:   Same   PROCEDURE: Right 1st MTP joint arthrodesis   SURGEON:  Lillia Mountain, MD   ASSISTANT: None   ANESTHESIA: General, regional   EBL: Minimal   TOURNIQUET:    Total Tourniquet Time Documented: Thigh (Right) - 46 minutes Total: Thigh (Right) - 46 minutes    COMPLICATIONS: None apparent   DISPOSITION: Extubated, awake and stable to recovery.   INDICATION FOR PROCEDURE: The patient presented with above diagnosis.  We discussed the diagnosis, alternative treatment options, risks and benefits of the above surgical intervention, as well as alternative non-operative treatments. All questions/concerns were addressed and the patient/family demonstrated appropriate understanding of the diagnosis, the procedure, the postoperative course, and overall prognosis. The patient wished to proceed with surgical intervention and signed an informed surgical consent as such, in each others presence prior to surgery.   PROCEDURE IN DETAIL: After preoperative consent was obtained and the correct operative site was identified, the patient was brought to the operating room supine on stretcher. General anesthesia was induced. Preoperative antibiotics were administered. Surgical timeout was taken. The patient was then positioned supine. The operative lower extremity was prepped and draped in standard sterile fashion with a tourniquet around the thigh. The extremity was exsanguinated and the tourniquet was inflated to 275 mmHg.  A dorsal approach was made directly over the first metatarsophalangeal joint. This was carried down to the level of the extensor hallucis longus tendon. The capsule was incised medial to the tendon, and the tendon was protected throughout the procedure. The capsule and surrounding ligaments were  released in order to allow full exposure of the first MTP joint. The cartilage remaining on either articular surface of the joint was debrided using curette and rongeur. A guide wire was placed directly into the first metatarsal and appropriate sized reamer was used to prepare the surface. This guidewire was then used in the proximal phalanx, and that articular surface was reamed similarly. Both joint surfaces were perforated with a K wire in order to stimulate bone healing across the arthrodesis site. The medial eminence was resected with a saw blade.    A crossing guide wire was placed to secure the arthrodesis in appropriate position. This was verified clinically using foot plate as well as with fluoroscopy. A headless DartFire compression screw was implanted over this guidewire using cannulated drill technique achieving excellent compression across the arthrodesis site. We then selected an appropriate sized Stryker Utility plate and implanted this with a series of non-locking and locking screws. Footplate and fluoroscopy were used throughout to verify appropriate position of the fusion.   The surgical sites were thoroughly irrigated. The tourniquet was deflated and hemostasis achieved. The deep layers were closed using 2-0 vicryl. The skin was closed without tension.    The leg was cleaned with saline and sterile dressings with gauze were applied. Well padded bulky dressings and wrap were applied. The patient was awakened from anesthesia and transported to the recovery room in stable condition.    FOLLOW UP PLAN: -transfer to PACU, then home -strict heel WB operative extremity once nerve block wears off, maximum elevation -maintain dry dressings until follow up -DVT ppx: Aspirin  81 mg twice daily -follow up as outpatient within 7-10 days for wound check -sutures out in 2-3 weeks in outpatient office   RADIOGRAPHS: AP, lateral,  oblique and stress radiographs of the right foot were obtained  intraoperatively. These showed interval 1st MTP fusion. Manual stress radiographs were taken and the joints were noted to be stable following procedure. All hardware is appropriately positioned and of the appropriate lengths. No other acute injuries are noted.   Lillia Mountain Orthopaedic Surgery [facility]

## 2024-04-28 NOTE — Anesthesia Procedure Notes (Signed)
 Procedure Name: LMA Insertion Date/Time: 04/28/2024 8:54 AM  Performed by: Julieanne Fairy BROCKS, CRNAPre-anesthesia Checklist: Patient identified, Emergency Drugs available, Suction available and Patient being monitored Patient Re-evaluated:Patient Re-evaluated prior to induction Oxygen Delivery Method: Circle system utilized Preoxygenation: Pre-oxygenation with 100% oxygen Induction Type: IV induction Ventilation: Mask ventilation without difficulty LMA: LMA inserted LMA Size: 4.0 Number of attempts: 1 Airway Equipment and Method: Bite block Placement Confirmation: positive ETCO2 Tube secured with: Tape Dental Injury: Teeth and Oropharynx as per pre-operative assessment

## 2024-05-03 ENCOUNTER — Encounter (HOSPITAL_BASED_OUTPATIENT_CLINIC_OR_DEPARTMENT_OTHER): Payer: Self-pay | Admitting: Orthopaedic Surgery

## 2024-06-11 ENCOUNTER — Other Ambulatory Visit: Payer: Self-pay | Admitting: Orthopaedic Surgery

## 2024-06-11 DIAGNOSIS — M2011 Hallux valgus (acquired), right foot: Secondary | ICD-10-CM

## 2024-06-14 ENCOUNTER — Encounter (HOSPITAL_BASED_OUTPATIENT_CLINIC_OR_DEPARTMENT_OTHER): Payer: Self-pay | Admitting: Orthopaedic Surgery

## 2024-06-14 ENCOUNTER — Ambulatory Visit
Admission: RE | Admit: 2024-06-14 | Discharge: 2024-06-14 | Disposition: A | Source: Ambulatory Visit | Attending: Orthopaedic Surgery

## 2024-06-14 ENCOUNTER — Other Ambulatory Visit: Payer: Self-pay

## 2024-06-14 DIAGNOSIS — M2011 Hallux valgus (acquired), right foot: Secondary | ICD-10-CM

## 2024-06-15 NOTE — Discharge Instructions (Signed)
 Lillia Mountain, MD [facility]  Please read the following information regarding your care after surgery.  Medications   - Oxycodone  5 mg every 6 hours as needed for pain - Aspirin  81 mg twice daily as scheduled to prevent blood clots - Colace 100 mg twice daily as needed for constipation - Zofran  4 mg every 8 hours as needed for nausea/vomiting  We send above prescriptions to your pharmacy on file.  In addition you may also use: ? acetaminophen  (Tylenol ) 500 mg every 4-6 hours as you need for minor to moderate pain  Resume all other routine medications per usual or as directed by your PCP/other specialists.  ? To help prevent blood clots, you should also get up every hour while you are awake to move around.  Weight Bearing ? Once your nerve block is completely worn off, OK to HEEL weightbear in postop shoe on the operated leg or foot. This means do NOT touch the front of your surgical leg to the ground!  Cast / Splint / Dressing ? If you have a dressing, do NOT remove this. Keep your splint, cast or dressing clean and dry.  Dont put anything (coat hanger, pencil, etc) down inside of it.  If it gets wet, call the office immediately to schedule an appointment for a cast change.  Swelling IMPORTANT: It is normal for you to have swelling where you had surgery. To reduce swelling and pain, keep at least 3 pillows under your leg so that your toes are above your nose and your heel is above the level of your hip.  It may be necessary to keep your foot or leg elevated for several weeks.  This is critical to helping your incisions heal and your pain to feel better.  Follow Up Call my office at [phone] when you are discharged from the hospital or surgery center to schedule an appointment to be seen 7-10 days after surgery.  Call my office at [phone] if you develop a fever >101.5 F, nausea, vomiting, bleeding from the surgical site or severe pain.        Post Anesthesia Home  Care Instructions  Activity: Get plenty of rest for the remainder of the day. A responsible individual must stay with you for 24 hours following the procedure.  For the next 24 hours, DO NOT: -Drive a car -Advertising copywriter -Drink alcoholic beverages -Take any medication unless instructed by your physician -Make any legal decisions or sign important papers.  Meals: Start with liquid foods such as gelatin or soup. Progress to regular foods as tolerated. Avoid greasy, spicy, heavy foods. If nausea and/or vomiting occur, drink only clear liquids until the nausea and/or vomiting subsides. Call your physician if vomiting continues.  Special Instructions/Symptoms: Your throat may feel dry or sore from the anesthesia or the breathing tube placed in your throat during surgery. If this causes discomfort, gargle with warm salt water. The discomfort should disappear within 24 hours.  If you had a scopolamine patch placed behind your ear for the management of post- operative nausea and/or vomiting:  1. The medication in the patch is effective for 72 hours, after which it should be removed.  Wrap patch in a tissue and discard in the trash. Wash hands thoroughly with soap and water. 2. You may remove the patch earlier than 72 hours if you experience unpleasant side effects which may include dry mouth, dizziness or visual disturbances. 3. Avoid touching the patch. Wash your hands with soap and water after contact  with the patch.      Regional Anesthesia Blocks  1. You may not be able to move or feel the blocked extremity after a regional anesthetic block. This may last may last from 3-48 hours after placement, but it will go away. The length of time depends on the medication injected and your individual response to the medication. As the nerves start to wake up, you may experience tingling as the movement and feeling returns to your extremity. If the numbness and inability to move your extremity has  not gone away after 48 hours, please call your surgeon.   2. The extremity that is blocked will need to be protected until the numbness is gone and the strength has returned. Because you cannot feel it, you will need to take extra care to avoid injury. Because it may be weak, you may have difficulty moving it or using it. You may not know what position it is in without looking at it while the block is in effect.  3. For blocks in the legs and feet, returning to weight bearing and walking needs to be done carefully. You will need to wait until the numbness is entirely gone and the strength has returned. You should be able to move your leg and foot normally before you try and bear weight or walk. You will need someone to be with you when you first try to ensure you do not fall and possibly risk injury.  4. Bruising and tenderness at the needle site are common side effects and will resolve in a few days.  5. Persistent numbness or new problems with movement should be communicated to the surgeon or the [facility] [phone] [facility] [phone]).   Next dose of Tylenol  may be given at 2:00pm if needed.

## 2024-06-15 NOTE — H&P (Signed)
 ORTHOPAEDIC SURGERY H&P  Subjective:  The patient presents for right foot I&D with possible removal of hardware, possible delayed wound closure.   Past Medical History:  Diagnosis Date   Anxiety    Arthritis    knees   Bipolar 1 disorder (HCC)    Breast cancer (HCC)    breast cancer right    Convulsions/seizures (HCC) 03/16/2015   08-27-2014 last seizure   Depression 05/06/2012   Depression, major    History of stroke 09/14/2014   Hyperlipidemia 05/06/2012   Hypertension    Neuromuscular disorder (HCC)    neuropathy, plantar fascitis    Obesity    Personal history of radiation therapy    completed 08-18-2015   Schizo affective schizophrenia (HCC)    resolved per pt per her Psychiatrist    Stroke [facility])    05/02/12, no deficits   Tobacco abuse 05/06/2012    Past Surgical History:  Procedure Laterality Date   ABDOMINAL HYSTERECTOMY     BREAST BIOPSY Left 2007   BREAST LUMPECTOMY Right 2016   CHOLECYSTECTOMY     COLONOSCOPY     FOOT ARTHRODESIS Right 04/28/2024   Procedure: FUSION, JOINT, FOOT BIG TOE;  Surgeon: Barton Drape, MD;  Location: [facility];  Service: Orthopedics;  Laterality: Right;  right 1st MTP fusion, possible allograft   POLYPECTOMY     RADIOACTIVE SEED GUIDED PARTIAL MASTECTOMY WITH AXILLARY SENTINEL LYMPH NODE BIOPSY Right 05/12/2015   no mastectomy   TEE WITHOUT CARDIOVERSION  05/06/2012   Procedure: TRANSESOPHAGEAL ECHOCARDIOGRAM (TEE);  Surgeon: Ezra GORMAN Shuck, MD;  Location: [facility];  Service: Cardiovascular;  Laterality: N/A;     Show/hide medication list[1]   Allergies[2]  Social History   Socioeconomic History   Marital status: Widowed    Spouse name: Not on file   Number of children: 3   Years of education: 67   Highest education level: Not on file  Occupational History   Occupation: disabled  Tobacco Use   Smoking status: Every Day    Current packs/day: 0.50    Types: Cigarettes   Smokeless tobacco:  Never  Substance and Sexual Activity   Alcohol use: No   Drug use: Not Currently    Frequency: 3.0 times per week   Sexual activity: Not Currently  Other Topics Concern   Not on file  Social History Narrative   Patient  lives at home with her brother(Kevin). Patient is disabled and has three children. Patient is sep rated  And high school education.   Patient is right handed.   Patient drinks 3-4 cups of caffeine  daily.   Social Drivers of Health   Tobacco Use: High Risk (06/14/2024)   Patient History    Smoking Tobacco Use: Every Day    Smokeless Tobacco Use: Never    Passive Exposure: Not on file  Financial Resource Strain: Not on file  Food Insecurity: Not on file  Transportation Needs: Not on file  Physical Activity: Not on file  Stress: Not on file  Social Connections: Not on file  Intimate Partner Violence: Not on file  Depression (EYV7-0): Not on file  Alcohol Screen: Not on file  Housing: Not on file  Utilities: Not on file  Health Literacy: Not on file     History reviewed. No pertinent family history.   Review of Systems Pertinent items are noted in HPI.  Objective: Vital signs in last 24 hours:    06/14/2024    9:07 AM 04/28/2024  10:53 AM 04/28/2024   10:30 AM  Vitals with BMI  Height 5' 3    Weight 180 lbs    BMI 31.89    Systolic  133 120  Diastolic  65 59  Pulse  63 75      EXAM: General: Well nourished, well developed. Awake, alert and oriented to time, place, person. Normal mood and affect. No apparent distress. Breathing room air.  Operative Lower Extremity: Alignment - Neutral Deformity - None Wound delayed healing over 1st MTP Tenderness to palpation - right  1st MTP 5/5 TA, PT, GS, Per, EHL, FHL Sensation intact to light touch throughout Palpable DP and PT pulses Special testing: None  The contralateral foot/ankle was examined for comparison and noted to be neurovascularly intact with no localized deformity, swelling, or  tenderness.  Imaging Review All images taken were independently reviewed by me.  Assessment/Plan: The clinical and radiographic findings were reviewed and discussed at length with the patient.  The patient presents for right foot I&D with possible removal of hardware, possible delayed wound closure.  We spoke at length about the natural course of these findings. We discussed nonoperative and operative treatment options in detail.  The risks and benefits were presented and reviewed. The risks due to hardware/suture failure and/or irritation (if removing hardware: inability to remove part/all of hardware, recurrent instability), new/persistent infection, stiffness, nerve/vessel/tendon injury or rerupture of repaired tendon, nonunion/malunion, allograft usage, wound healing issues, development of arthritis, failure of this surgery, possibility of external fixation with delayed definitive surgery, need for further surgery, thromboembolic events, anesthesia/medical complications, amputation, death among others were discussed.  Lillia Mountain  Orthopaedic Surgery [facility]     [1] (Not in an outpatient encounter) [2]  Allergies Allergen Reactions   Morphine And Codeine Hives and Itching   Hydrocodone Rash

## 2024-06-16 ENCOUNTER — Ambulatory Visit (HOSPITAL_BASED_OUTPATIENT_CLINIC_OR_DEPARTMENT_OTHER)

## 2024-06-16 ENCOUNTER — Ambulatory Visit (HOSPITAL_BASED_OUTPATIENT_CLINIC_OR_DEPARTMENT_OTHER)
Admission: RE | Admit: 2024-06-16 | Discharge: 2024-06-16 | Disposition: A | Discharge status: Home / Self Care | Attending: Orthopaedic Surgery | Admitting: Orthopaedic Surgery

## 2024-06-16 ENCOUNTER — Ambulatory Visit (HOSPITAL_BASED_OUTPATIENT_CLINIC_OR_DEPARTMENT_OTHER): Admitting: Anesthesiology

## 2024-06-16 ENCOUNTER — Encounter (HOSPITAL_BASED_OUTPATIENT_CLINIC_OR_DEPARTMENT_OTHER): Payer: Self-pay | Admitting: Orthopaedic Surgery

## 2024-06-16 ENCOUNTER — Other Ambulatory Visit: Payer: Self-pay

## 2024-06-16 ENCOUNTER — Encounter (HOSPITAL_BASED_OUTPATIENT_CLINIC_OR_DEPARTMENT_OTHER): Admission: RE | Disposition: A | Payer: Self-pay | Source: Home / Self Care | Attending: Orthopaedic Surgery

## 2024-06-16 DIAGNOSIS — T8131XA Disruption of external operation (surgical) wound, not elsewhere classified, initial encounter: Secondary | ICD-10-CM | POA: Diagnosis present

## 2024-06-16 DIAGNOSIS — Y838 Other surgical procedures as the cause of abnormal reaction of the patient, or of later complication, without mention of misadventure at the time of the procedure: Secondary | ICD-10-CM | POA: Insufficient documentation

## 2024-06-16 DIAGNOSIS — M2011 Hallux valgus (acquired), right foot: Secondary | ICD-10-CM

## 2024-06-16 DIAGNOSIS — I10 Essential (primary) hypertension: Secondary | ICD-10-CM | POA: Diagnosis not present

## 2024-06-16 DIAGNOSIS — Z8673 Personal history of transient ischemic attack (TIA), and cerebral infarction without residual deficits: Secondary | ICD-10-CM | POA: Insufficient documentation

## 2024-06-16 DIAGNOSIS — Z981 Arthrodesis status: Secondary | ICD-10-CM | POA: Diagnosis not present

## 2024-06-16 DIAGNOSIS — F17210 Nicotine dependence, cigarettes, uncomplicated: Secondary | ICD-10-CM | POA: Diagnosis not present

## 2024-06-16 DIAGNOSIS — F17200 Nicotine dependence, unspecified, uncomplicated: Secondary | ICD-10-CM | POA: Diagnosis not present

## 2024-06-16 DIAGNOSIS — F418 Other specified anxiety disorders: Secondary | ICD-10-CM

## 2024-06-16 DIAGNOSIS — Z01818 Encounter for other preprocedural examination: Secondary | ICD-10-CM

## 2024-06-16 HISTORY — PX: HARDWARE REMOVAL: SHX979

## 2024-06-16 HISTORY — PX: INCISION, DEEP WITH OPENING OF BONE CORTEX: SHX7319

## 2024-06-16 MED ORDER — MIDAZOLAM HCL 2 MG/2ML IJ SOLN
INTRAMUSCULAR | Status: AC
  Filled 2024-06-16: qty 2

## 2024-06-16 MED ORDER — ACETAMINOPHEN 500 MG PO TABS
1000.0000 mg | ORAL_TABLET | Freq: Once | ORAL | Status: AC
  Administered 2024-06-16: 1000 mg via ORAL

## 2024-06-16 MED ORDER — ONDANSETRON 4 MG PO TBDP: 4.0000 mg | ORAL_TABLET | Freq: Three times a day (TID) | ORAL | 0 refills | Status: AC | PRN

## 2024-06-16 MED ORDER — CEFAZOLIN SODIUM-DEXTROSE 2-4 GM/100ML-% IV SOLN
INTRAVENOUS | Status: AC
  Filled 2024-06-16: qty 100

## 2024-06-16 MED ORDER — OXYCODONE HCL 5 MG PO TABS: 5.0000 mg | ORAL_TABLET | ORAL | 0 refills | Status: AC | PRN

## 2024-06-16 MED ORDER — PROPOFOL 10 MG/ML IV BOLUS
INTRAVENOUS | Status: DC | PRN
  Administered 2024-06-16: 200 mg via INTRAVENOUS

## 2024-06-16 MED ORDER — HYDROMORPHONE HCL 1 MG/ML IJ SOLN
INTRAMUSCULAR | Status: AC
  Filled 2024-06-16: qty 0.5

## 2024-06-16 MED ORDER — POVIDONE-IODINE 10 % EX SOLN
CUTANEOUS | Status: DC | PRN
  Administered 2024-06-16: 1 via TOPICAL

## 2024-06-16 MED ORDER — CLONIDINE HCL (ANALGESIA) 100 MCG/ML EP SOLN
EPIDURAL | Status: DC | PRN
  Administered 2024-06-16: 100 ug

## 2024-06-16 MED ORDER — CHLORHEXIDINE GLUCONATE 4 % EX SOLN: 60.0000 mL | Freq: Once | CUTANEOUS | Status: DC

## 2024-06-16 MED ORDER — CEFAZOLIN SODIUM-DEXTROSE 2-4 GM/100ML-% IV SOLN
2.0000 g | INTRAVENOUS | Status: AC
  Administered 2024-06-16: 2 g via INTRAVENOUS

## 2024-06-16 MED ORDER — ACETAMINOPHEN 500 MG PO TABS
ORAL_TABLET | ORAL | Status: AC
  Filled 2024-06-16: qty 2

## 2024-06-16 MED ORDER — PHENYLEPHRINE HCL (PRESSORS) 10 MG/ML IV SOLN
INTRAVENOUS | Status: DC | PRN
  Administered 2024-06-16: 80 ug via INTRAVENOUS
  Administered 2024-06-16: 160 ug via INTRAVENOUS
  Administered 2024-06-16 (×2): 80 ug via INTRAVENOUS

## 2024-06-16 MED ORDER — ASPIRIN 81 MG PO TBEC: 81.0000 mg | DELAYED_RELEASE_TABLET | Freq: Two times a day (BID) | ORAL | 0 refills | Status: AC

## 2024-06-16 MED ORDER — BUPIVACAINE-EPINEPHRINE (PF) 0.5% -1:200000 IJ SOLN
INTRAMUSCULAR | Status: DC | PRN
  Administered 2024-06-16: 30 mL via PERINEURAL

## 2024-06-16 MED ORDER — FENTANYL CITRATE (PF) 100 MCG/2ML IJ SOLN
INTRAMUSCULAR | Status: AC
  Filled 2024-06-16: qty 2

## 2024-06-16 MED ORDER — DEXAMETHASONE SODIUM PHOSPHATE 4 MG/ML IJ SOLN
INTRAMUSCULAR | Status: DC | PRN
  Administered 2024-06-16: 5 mg via INTRAVENOUS

## 2024-06-16 MED ORDER — EPHEDRINE SULFATE (PRESSORS) 25 MG/5ML IV SOSY
PREFILLED_SYRINGE | INTRAVENOUS | Status: DC | PRN
  Administered 2024-06-16 (×2): 5 mg via INTRAVENOUS

## 2024-06-16 MED ORDER — DROPERIDOL 2.5 MG/ML IJ SOLN: 0.6250 mg | Freq: Once | INTRAMUSCULAR | Status: DC | PRN

## 2024-06-16 MED ORDER — ONDANSETRON HCL 4 MG/2ML IJ SOLN
INTRAMUSCULAR | Status: AC
  Filled 2024-06-16: qty 2

## 2024-06-16 MED ORDER — VANCOMYCIN HCL 500 MG IV SOLR
INTRAVENOUS | Status: AC
  Filled 2024-06-16: qty 10

## 2024-06-16 MED ORDER — VANCOMYCIN HCL 1000 MG IV SOLR
INTRAVENOUS | Status: AC
  Filled 2024-06-16: qty 20

## 2024-06-16 MED ORDER — VANCOMYCIN HCL 500 MG IV SOLR
INTRAVENOUS | Status: DC | PRN
  Administered 2024-06-16: 500 mg

## 2024-06-16 MED ORDER — BUPIVACAINE-EPINEPHRINE (PF) 0.5% -1:200000 IJ SOLN
INTRAMUSCULAR | Status: AC
  Filled 2024-06-16: qty 30

## 2024-06-16 MED ORDER — LACTATED RINGERS IV SOLN: INTRAVENOUS | Status: DC

## 2024-06-16 MED ORDER — DOCUSATE SODIUM 100 MG PO CAPS: 100.0000 mg | ORAL_CAPSULE | Freq: Two times a day (BID) | ORAL | 0 refills | Status: AC

## 2024-06-16 MED ORDER — ONDANSETRON HCL 4 MG/2ML IJ SOLN
INTRAMUSCULAR | Status: DC | PRN
  Administered 2024-06-16: 4 mg via INTRAVENOUS

## 2024-06-16 MED ORDER — MIDAZOLAM HCL 5 MG/5ML IJ SOLN
INTRAMUSCULAR | Status: DC | PRN
  Administered 2024-06-16: 2 mg via INTRAVENOUS

## 2024-06-16 MED ORDER — 0.9 % SODIUM CHLORIDE (POUR BTL) OPTIME
TOPICAL | Status: DC | PRN
  Administered 2024-06-16: 1000 mL

## 2024-06-16 MED ORDER — KETOROLAC TROMETHAMINE 30 MG/ML IJ SOLN
INTRAMUSCULAR | Status: DC | PRN
  Administered 2024-06-16: 30 mg via INTRAVENOUS

## 2024-06-16 MED ORDER — HYDROMORPHONE HCL 1 MG/ML IJ SOLN
0.2500 mg | INTRAMUSCULAR | Status: DC | PRN
  Administered 2024-06-16 (×2): 0.5 mg via INTRAVENOUS

## 2024-06-16 MED ORDER — FENTANYL CITRATE (PF) 100 MCG/2ML IJ SOLN
INTRAMUSCULAR | Status: DC | PRN
  Administered 2024-06-16: 25 ug via INTRAVENOUS
  Administered 2024-06-16: 100 ug via INTRAVENOUS
  Administered 2024-06-16: 50 ug via INTRAVENOUS
  Administered 2024-06-16: 25 ug via INTRAVENOUS

## 2024-06-16 MED ORDER — LIDOCAINE 2% (20 MG/ML) 5 ML SYRINGE
INTRAMUSCULAR | Status: DC | PRN
  Administered 2024-06-16: 100 mg via INTRAVENOUS

## 2024-06-16 MED ORDER — LIDOCAINE 2% (20 MG/ML) 5 ML SYRINGE
INTRAMUSCULAR | Status: AC
  Filled 2024-06-16: qty 5

## 2024-06-16 NOTE — Anesthesia Procedure Notes (Signed)
 Anesthesia Regional Block: Popliteal block   Pre-Anesthetic Checklist: , timeout performed,  Correct Patient, Correct Site, Correct Laterality,  Correct Procedure, Correct Position, site marked,  Risks and benefits discussed,  Pre-op evaluation,  At surgeon's request and post-op pain management  Laterality: Right  Prep: Maximum Sterile Barrier Precautions used, chloraprep       Needles:  Injection technique: Single-shot  Needle Type: Echogenic Stimulator Needle     Needle Length: 9cm  Needle Gauge: 22     Additional Needles:   Procedures:,,,, ultrasound used (permanent image in chart),,    Narrative:  Start time: 06/16/2024 4:21 PM End time: 06/16/2024 4:24 PM Injection made incrementally with aspirations every 5 mL.  Performed by: Personally  Anesthesiologist: Paul Lamarr BRAVO, MD  Additional Notes: Risks, benefits, and alternative discussed. Patient gave consent for procedure. Patient prepped and draped in sterile fashion. Sedation administered, patient remains easily responsive to voice. Relevant anatomy identified with ultrasound guidance. Local anesthetic given in 5cc increments with no signs or symptoms of intravascular injection. No pain or paraesthesias with injection. Patient monitored throughout procedure with no signs of LAST or immediate complications. Tolerated well. Ultrasound image placed in chart.  LANEY Paul, MD

## 2024-06-16 NOTE — Anesthesia Procedure Notes (Signed)
 Procedure Name: LMA Insertion Date/Time: 06/16/2024 3:08 PM  Performed by: Donnell Berwyn SQUIBB, CRNAPre-anesthesia Checklist: Patient identified, Emergency Drugs available, Suction available, Patient being monitored and Timeout performed Patient Re-evaluated:Patient Re-evaluated prior to induction Oxygen Delivery Method: Circle system utilized Preoxygenation: Pre-oxygenation with 100% oxygen Induction Type: IV induction Ventilation: Mask ventilation without difficulty LMA: LMA inserted LMA Size: 4.0 Placement Confirmation: positive ETCO2 and breath sounds checked- equal and bilateral Tube secured with: Tape Dental Injury: Teeth and Oropharynx as per pre-operative assessment

## 2024-06-16 NOTE — Anesthesia Preprocedure Evaluation (Signed)
"                                    Anesthesia Evaluation  Patient identified by MRN, date of birth, ID band Patient awake    Reviewed: Allergy & Precautions, NPO status , Patient's Chart, lab work & pertinent test results  History of Anesthesia Complications Negative for: history of anesthetic complications  Airway Mallampati: II  TM Distance: >3 FB Neck ROM: Full    Dental  (+) Edentulous Lower, Edentulous Upper   Pulmonary Current Smoker   Pulmonary exam normal        Cardiovascular hypertension, Pt. on home beta blockers and Pt. on medications Normal cardiovascular exam     Neuro/Psych  Headaches, Seizures -, Well Controlled,   Anxiety Depression Bipolar Disorder Schizophrenia  CVA, No Residual Symptoms    GI/Hepatic   Endo/Other    Renal/GU      Musculoskeletal  (+) Arthritis ,    Abdominal   Peds  Hematology   Anesthesia Other Findings   Reproductive/Obstetrics                              Anesthesia Physical Anesthesia Plan  ASA: 3  Anesthesia Plan: General   Post-op Pain Management: Tylenol  PO (pre-op)*   Induction: Intravenous  PONV Risk Score and Plan: Treatment may vary due to age or medical condition, Ondansetron , Dexamethasone  and Midazolam   Airway Management Planned: Oral ETT and LMA  Additional Equipment: None  Intra-op Plan:   Post-operative Plan: Extubation in OR  Informed Consent: I have reviewed the patients History and Physical, chart, labs and discussed the procedure including the risks, benefits and alternatives for the proposed anesthesia with the patient or authorized representative who has indicated his/her understanding and acceptance.     Dental advisory given  Plan Discussed with: CRNA  Anesthesia Plan Comments:         Anesthesia Quick Evaluation  "

## 2024-06-16 NOTE — Progress Notes (Signed)
 Assisted Dr. Stephannie Peters with right, popliteal, ultrasound guided block. Side rails up, monitors on throughout procedure. See vital signs in flow sheet. Tolerated Procedure well.

## 2024-06-16 NOTE — H&P (Signed)
 H&P Update:  -History and Physical Reviewed  -Patient has been re-examined  -No change in the plan of care  -The risks and benefits were presented and reviewed. The risks due to hardware/suture failure and/or irritation (if removing hardware: inability to remove part/all of hardware, recurrent instability), new/persistent infection, stiffness, nerve/vessel/tendon injury or rerupture of repaired tendon, nonunion/malunion, allograft usage, wound healing issues, development of arthritis, failure of this surgery, possibility of external fixation with delayed definitive surgery, need for further surgery, thromboembolic events, anesthesia/medical complications, amputation, death among others were discussed. The patient acknowledged the explanation, agreed to proceed with the plan and a consent was signed.  Lillia Mountain

## 2024-06-16 NOTE — Op Note (Addendum)
 06/16/2024  7:59 PM   PATIENT: April [name]  59 y.o. 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   INDICATION FOR PROCEDURE: The patient presented with above diagnosis. She underwent right 1st MTP fusion on 04/28/24. Unfortunately, she developed wound dehiscence of the surgical site. She reports consistent postoperative smoking and a habit of wound picking. Due to clinical exam, it was determined that the best approach would be surgical debridement and removal of the hardware to allow for wound closure. On CT imaging, it was noted that the 1st MTP site is fusing excellently. No signs of infection noted at any time. We discussed potential need for prolonged wound care and/or plastic surgical reconstruction with or without surgical debridement.  We discussed the diagnosis, alternative treatment options, risks and benefits of the above surgical intervention, as well as alternative non-operative treatments. All questions/concerns were addressed and the patient/family demonstrated appropriate understanding of the diagnosis, the procedure, the postoperative course, and overall prognosis. The patient wished to proceed with surgical intervention and signed an informed surgical consent as such, in each others presence prior to surgery.   PROCEDURE IN DETAIL: After preoperative consent was obtained and the correct operative site was identified, the patient was brought to the  operating room supine on stretcher. General anesthesia was induced. Preoperative antibiotics were administered. Surgical timeout was taken. The patient was then positioned supine. The operative lower extremity was prepped and draped in standard sterile fashion with a tourniquet around the thigh. However this was never inflated.  Prior dorsal approach was utilized directly over the first metatarsophalangeal joint. This was carried down to the level of the extensor hallucis longus tendon. The capsule was incised medial to the tendon, and the tendon was protected throughout the procedure. The capsule and surrounding ligaments were released in order to allow full exposure of the first MTP fusion plate.    The plate and its four screws were removed completely without difficulty. The bone cortex was incised and the foot debrided thoroughly. The crossing screw was retained. There was no motion at the fusion site either clinically or fluoroscopically. Footplate and fluoroscopy were used throughout to verify continued appropriate position of the fusion.   The surgical sites were thoroughly irrigated.Betadine  and Irrisept solutions were instilled as well as several literals of normal saline. Vancomycin  powder was applied to the wound bed. The tourniquet was deflated and hemostasis achieved. The tissues and wound edges were debrided back to healthy bleeding tissues. The skin was closed without tension using 2-0 Prolene.    The leg was cleaned with saline and sterile dressings with gauze were applied. Well padded bulky dressings and wrap were applied. The patient was awakened from anesthesia and transported to the recovery room in stable condition.    FOLLOW UP PLAN: -transfer to PACU, then home -strict heel WB operative extremity, maximum elevation -maintain dry dressings until follow up -smoking cessation counseling -DVT ppx: Aspirin  81 mg twice daily -follow up  as outpatient within 7-10 days for wound  check -sutures out in 2-3 weeks in outpatient office   RADIOGRAPHS: AP, lateral, oblique and stress radiographs of the operative foot were obtained intraoperatively. These showed interval removal of fusion plate across 1st MTP. Manual stress radiographs were taken and the fusion and all joints were noted to be stable following procedure. All hardware is appropriately positioned and of the appropriate lengths. No other acute injuries are noted.   Lillia Mountain Orthopaedic Surgery [facility]

## 2024-06-16 NOTE — Anesthesia Postprocedure Evaluation (Signed)
"   Anesthesia Post Note  Patient: Quinlee T Spada  Procedure(s) Performed: REMOVAL, HARDWARE (Right: Foot) INCISION, DEEP WITH OPENING OF BONE CORTEX (Right: Foot)     Patient location during evaluation: PACU Anesthesia Type: General Level of consciousness: awake and alert Pain management: pain level controlled Vital Signs Assessment: post-procedure vital signs reviewed and stable Respiratory status: spontaneous breathing, nonlabored ventilation and respiratory function stable Cardiovascular status: blood pressure returned to baseline Postop Assessment: no apparent nausea or vomiting Anesthetic complications: no Comments: Patient with severe pain in PACU despite fentanyl  200mcg IV intra-op and dilaudid  1mg  in PACU. Patient requests regional anesthetic for analgesia. Popliteal block performed in PACU with resolution of pain before discharge. Lawence, MD   No notable events documented.  Last Vitals:  Vitals:   06/16/24 1600 06/16/24 1615  BP: 134/80 (!) 144/79  Pulse: 87 87  Resp: 18 (!) 21  Temp:    SpO2: 95% 97%                Vertell Row      "

## 2024-06-16 NOTE — Transfer of Care (Signed)
 Immediate Anesthesia Transfer of Care Note  Patient: April [name]  Procedure(s) Performed: REMOVAL, HARDWARE (Right: Foot) INCISION, DEEP WITH OPENING OF BONE CORTEX (Right: Foot)  Patient Location: PACU  Anesthesia Type:General  Level of Consciousness: awake, alert , oriented, and patient cooperative  Airway & Oxygen Therapy: Patient Spontanous Breathing and Patient connected to nasal cannula oxygen  Post-op Assessment: Report given to RN and Post -op Vital signs reviewed and stable  Post vital signs: Reviewed and stable  Last Vitals:  Vitals Value Taken Time  BP 136/94 06/16/24 15:58  Temp    Pulse 90 06/16/24 15:59  Resp 17 06/16/24 15:59  SpO2 94 % 06/16/24 15:59  Vitals shown include unfiled device data.  Last Pain:  Vitals:   06/16/24 1223  TempSrc: Oral  PainSc: 7          Complications: No notable events documented.

## 2024-06-17 ENCOUNTER — Encounter (HOSPITAL_BASED_OUTPATIENT_CLINIC_OR_DEPARTMENT_OTHER): Payer: Self-pay | Admitting: Orthopaedic Surgery

## 2024-06-28 ENCOUNTER — Encounter (HOSPITAL_BASED_OUTPATIENT_CLINIC_OR_DEPARTMENT_OTHER): Admitting: General Surgery

## 2024-07-15 ENCOUNTER — Encounter (HOSPITAL_BASED_OUTPATIENT_CLINIC_OR_DEPARTMENT_OTHER): Admitting: General Surgery
